# Patient Record
Sex: Female | Born: 1960
Health system: Southern US, Community
[De-identification: ages and names within clinical notes are randomized; demographics above are authoritative.]

## PROBLEM LIST (undated history)

## (undated) DIAGNOSIS — E079 Disorder of thyroid, unspecified: Secondary | ICD-10-CM

## (undated) DIAGNOSIS — K921 Melena: Secondary | ICD-10-CM

## (undated) DIAGNOSIS — G4730 Sleep apnea, unspecified: Secondary | ICD-10-CM

## (undated) DIAGNOSIS — I10 Essential (primary) hypertension: Secondary | ICD-10-CM

## (undated) DIAGNOSIS — E785 Hyperlipidemia, unspecified: Secondary | ICD-10-CM

## (undated) HISTORY — PX: UTERINE FIBROID EMBOLIZATION: SHX825

## (undated) HISTORY — DX: Melena: K92.1

## (undated) HISTORY — DX: Disorder of thyroid, unspecified: E07.9

## (undated) HISTORY — DX: Essential (primary) hypertension: I10

## (undated) HISTORY — DX: Hyperlipidemia, unspecified: E78.5

---

## 1998-07-20 ENCOUNTER — Other Ambulatory Visit: Admission: RE | Admit: 1998-07-20 | Discharge: 1998-07-20 | Payer: Self-pay | Admitting: Obstetrics and Gynecology

## 1998-12-14 ENCOUNTER — Ambulatory Visit (HOSPITAL_COMMUNITY): Admission: RE | Admit: 1998-12-14 | Discharge: 1998-12-14 | Payer: Self-pay | Admitting: Obstetrics and Gynecology

## 2001-06-10 ENCOUNTER — Other Ambulatory Visit: Admission: RE | Admit: 2001-06-10 | Discharge: 2001-06-10 | Payer: Self-pay | Admitting: Obstetrics and Gynecology

## 2002-01-01 ENCOUNTER — Inpatient Hospital Stay (HOSPITAL_COMMUNITY): Admission: RE | Admit: 2002-01-01 | Discharge: 2002-01-02 | Payer: Self-pay | Admitting: Gynecology

## 2002-01-01 ENCOUNTER — Encounter (INDEPENDENT_AMBULATORY_CARE_PROVIDER_SITE_OTHER): Payer: Self-pay | Admitting: Specialist

## 2003-01-22 ENCOUNTER — Other Ambulatory Visit: Admission: RE | Admit: 2003-01-22 | Discharge: 2003-01-22 | Payer: Self-pay | Admitting: Obstetrics and Gynecology

## 2004-01-26 ENCOUNTER — Other Ambulatory Visit: Admission: RE | Admit: 2004-01-26 | Discharge: 2004-01-26 | Payer: Self-pay | Admitting: Obstetrics and Gynecology

## 2004-02-29 ENCOUNTER — Encounter (INDEPENDENT_AMBULATORY_CARE_PROVIDER_SITE_OTHER): Payer: Self-pay | Admitting: Specialist

## 2004-02-29 ENCOUNTER — Encounter: Admission: RE | Admit: 2004-02-29 | Discharge: 2004-02-29 | Payer: Self-pay | Admitting: Surgery

## 2005-02-20 ENCOUNTER — Encounter: Admission: RE | Admit: 2005-02-20 | Discharge: 2005-02-20 | Payer: Self-pay | Admitting: Internal Medicine

## 2005-03-07 ENCOUNTER — Encounter: Admission: RE | Admit: 2005-03-07 | Discharge: 2005-03-07 | Payer: Self-pay | Admitting: Internal Medicine

## 2005-03-14 ENCOUNTER — Other Ambulatory Visit: Admission: RE | Admit: 2005-03-14 | Discharge: 2005-03-14 | Payer: Self-pay | Admitting: Obstetrics and Gynecology

## 2006-02-26 ENCOUNTER — Encounter: Admission: RE | Admit: 2006-02-26 | Discharge: 2006-02-26 | Payer: Self-pay | Admitting: Internal Medicine

## 2006-05-21 ENCOUNTER — Encounter: Admission: RE | Admit: 2006-05-21 | Discharge: 2006-05-21 | Payer: Self-pay | Admitting: Obstetrics and Gynecology

## 2007-03-01 ENCOUNTER — Encounter: Admission: RE | Admit: 2007-03-01 | Discharge: 2007-03-01 | Payer: Self-pay | Admitting: Obstetrics and Gynecology

## 2008-03-02 ENCOUNTER — Encounter: Admission: RE | Admit: 2008-03-02 | Discharge: 2008-03-02 | Payer: Self-pay | Admitting: Obstetrics and Gynecology

## 2008-09-23 ENCOUNTER — Ambulatory Visit: Payer: Self-pay | Admitting: Family Medicine

## 2009-03-29 ENCOUNTER — Encounter: Admission: RE | Admit: 2009-03-29 | Discharge: 2009-03-29 | Payer: Self-pay | Admitting: Obstetrics and Gynecology

## 2009-08-11 ENCOUNTER — Ambulatory Visit: Payer: Self-pay | Admitting: Gastroenterology

## 2010-04-08 ENCOUNTER — Encounter: Admission: RE | Admit: 2010-04-08 | Discharge: 2010-04-08 | Payer: Self-pay | Admitting: Internal Medicine

## 2010-12-11 ENCOUNTER — Encounter: Payer: Self-pay | Admitting: Internal Medicine

## 2011-03-31 ENCOUNTER — Other Ambulatory Visit: Payer: Self-pay | Admitting: Obstetrics and Gynecology

## 2011-03-31 DIAGNOSIS — Z1231 Encounter for screening mammogram for malignant neoplasm of breast: Secondary | ICD-10-CM

## 2011-04-07 NOTE — H&P (Signed)
[facility]  Patient:    Alison Rodriguez, Alison Rodriguez Visit Number: [id number] MRN: [id number]          Service Type: GYN Location: 1S X003 01 Attending Physician:  Rolinda Roan Dictated by:   Leatha Gilding. Mezer, M.D. Admit Date:  01/01/2002   CC:         Loraine Leriche E. Dareen Piano, M.D.   History and Physical  ADMITTING DIAGNOSIS:  Fibroid uterus.  HISTORY OF PRESENT ILLNESS:  The patient is a 50 year old gravida 1, AB1 female admitted with a fibroid uterus, menorrhagia, and dysmenorrhea admitted for abdominal myomectomy.  The patient has been without contraception for greater than seven years and on hysterosalpingogram had a significant distortion of the uterine cavity.  Ultrasound examination has confirmed multiple leiomyomata.  The patient also has severe dysmenorrhea and menorrhagia and has been using super tampons and overnight pads concurrently and changing about every hour.  She is frequently confined to bed.  Myomectomy has been discussed in detail with the patient and potential complications including but not limited to anesthesia; injury to the bowel, bladder, ureter; possible blood loss with transfusion and its sequelae; and possible postoperative adhesions have been discussed with the patient.  Because of the location of the fibroids, possible injury to or removal of the right fallopian tube had been discussed.  The possible need for hysterectomy at the time of surgery has also been discussed.  The patient understands that there is no guarantee that she will become pregnant or that she will not miscarry after this procedure.  There is also no guarantee that her menstrual cycles will decrease in flow, intensity, or discomfort.  Postoperative restrictions have been reviewed with the patient in detail.  PAST MEDICAL HISTORY:  Surgical:  Diagnostic laparoscopy.  Medical:  None.  MEDICATIONS:  Iron.  ALLERGIES:  None known.  SOCIAL HISTORY:  Smokes:   None.  FAMILY HISTORY:  Positive for diabetes and cardiovascular disease.  The patient does not smoke or drink alcohol.  She has a very supportive husband and is employed.  PHYSICAL EXAMINATION:  HEENT:  Negative.  HEART:  Without murmurs.  LUNGS:  Clear.  BREASTS:  Without masses or discharge.  ABDOMEN:  Obese, soft, and firm in the lower abdomen.  PELVIS:  The pelvic exam reveals ______, vagina, and cervix to be normal. The uterus is enlarged, possibly to approximately 16 weeks in size and irregular and it is quite firm and the adnexa are without masses.  EXTREMITIES:  Negative.  IMPRESSION:  Fibroid uterus, infertility, dysmenorrhea/menorrhagia, obesity.  PLAN:  Exploratory laparotomy with myomectomy. Dictated by:   Leatha Gilding. Mezer, M.D. Attending Physician:  Rolinda Roan DD:  01/01/02 TD:  01/01/02 Job: [id number]

## 2011-04-07 NOTE — H&P (Signed)
[facility]  Patient:    Alison Rodriguez, Alison Rodriguez Visit Number: [id number] MRN: [id number]          Service Type: GYN Location: 1S X003 01 Attending Physician:  Teodora Medici Cabitt Dictated by:   Leatha Gilding. Mezer, M.D. Admit Date:  01/01/2002                           History and Physical  NO DICTATION Dictated by:   Leatha Gilding. Mezer, M.D. Attending Physician:  Rolinda Roan DD:  01/01/02 TD:  01/01/02 Job: [id number]

## 2011-04-07 NOTE — Op Note (Signed)
[facility]  Patient:    Alison Rodriguez, Alison Rodriguez Visit Number: [id number] MRN: [id number]          Service Type: GYN Location: 4W 0455 01 Attending Physician:  Teodora Medici Cabitt Dictated by:   Leatha Gilding. Mezer, M.D. Proc. Date: 01/01/01 Admit Date:  01/01/2002   CC:         Loraine Leriche E. Dareen Piano, M.D.  Harl Bowie, M.D.   Operative Report  PREOPERATIVE DIAGNOSES:  1. Fibroid uterus.  2. Menorrhagia.  3. Dysmenorrhea.  POSTOPERATIVE DIAGNOSES:  1. Fibroid uterus.  2. Menorrhagia.  3. Dysmenorrhea.  OPERATION PERFORMED:  Myomectomy.  SURGEON:  Leatha Gilding. Mezer, M.D.  ASSISTANT:  Harl Bowie, M.D.  ANESTHESIA:  General endotracheal.  PREPARATION:  Betadine.  DESCRIPTION OF PROCEDURE:  With the patient in the supine position, he was prepped and draped in a routine fashion. The patients panniculus was particularly thick and an incision was made through the skin and subcutaneous tissue approximately two finger breadths above the pubic symphysis. There was significant bleeding from the initial incision down through opening the peritoneum. This all appeared to come from vessels and there was no significant oozing. Meticulous attention was paid to hemostasis. Upon entering the peritoneum, brief exploration of her upper abdomen was benign. Exploration of the pelvis revealed the uterus to be approximately 14 weeks in size due mainly to a left fundal subserosal myoma. The panniculus location of the sacrum and generous size large colon that was adherent to the left pelvic side wall at the level of the sacrum probably made the uterus feel larger on preoperative examination. The remainder of the uterus contained several small deep intramural fibroids which appeared not to have impact on the cavity. They were very mobile and although the incision was generous in size, the sacral promontory was in such a position as to make exposure of the posterior  aspect of the uterus quite challenging. Unfortunately the largest fibroid was intramural and probably submucosal and extended from just below the round ligament on the left side to below the uterine artery and vein. This did not deviate significantly anteriorly or posteriorly and it appeared to be impossible to remove this myoma without having an extremely high chance of damaging the uterine artery and vein and probably requiring removal of the left fallopian tube. As per the discussion with the patient preoperatively, she wished to take no excessive risk that would lead to hysterectomy but in the surgeons opinion, attempting to remove this fibroid would make the risk for hysterectomy unacceptably high to the patient. The left fundal subserosal fibroid was injected with dilute Pitressin, the serosa incised, the fibroid removed and hemostasis achieved in the base of the fibroid. The deep layers were closed with #0 chromic suture and the serosa was approximated with a running baseball stitch of 3-0 Vicryl. This area was also quite oozy but hemostasis appeared to be completely intact. The tubes and ovaries appeared to be normal. There was no additional pathology noted and at this point the abdomen was closed in layers using a running 2-0 Vicryl on the peritoneum, running #0 Vicryl at the midline bilaterally on the fascia, hemostasis was assured in the subcutaneous tissue and the skin was closed with staples. The estimated blood loss was approximately 125 cc, the sponge, needle and instrument counts were correct x2. The patient tolerated the procedure well and was taken to the recovery room in satisfactory condition. Dictated by: Leatha Gilding. Mezer, M.D. Attending Physician:  Teodora Medici  Cabitt DD:  01/01/02 TD:  01/01/02 Job: [id number]

## 2011-04-13 ENCOUNTER — Ambulatory Visit
Admission: RE | Admit: 2011-04-13 | Discharge: 2011-04-13 | Disposition: A | Discharge status: Home / Self Care | Payer: BC Managed Care – PPO | Source: Ambulatory Visit | Attending: Obstetrics and Gynecology | Admitting: Obstetrics and Gynecology

## 2011-04-13 DIAGNOSIS — Z1231 Encounter for screening mammogram for malignant neoplasm of breast: Secondary | ICD-10-CM

## 2011-06-28 ENCOUNTER — Other Ambulatory Visit: Payer: Self-pay | Admitting: Obstetrics and Gynecology

## 2012-04-05 ENCOUNTER — Other Ambulatory Visit: Payer: Self-pay | Admitting: Obstetrics and Gynecology

## 2012-04-05 DIAGNOSIS — Z1231 Encounter for screening mammogram for malignant neoplasm of breast: Secondary | ICD-10-CM

## 2012-04-08 ENCOUNTER — Ambulatory Visit: Payer: BC Managed Care – PPO

## 2012-04-26 ENCOUNTER — Ambulatory Visit
Admission: RE | Admit: 2012-04-26 | Discharge: 2012-04-26 | Disposition: A | Discharge status: Home / Self Care | Payer: BC Managed Care – PPO | Source: Ambulatory Visit | Attending: Obstetrics and Gynecology | Admitting: Obstetrics and Gynecology

## 2012-04-26 DIAGNOSIS — Z1231 Encounter for screening mammogram for malignant neoplasm of breast: Secondary | ICD-10-CM

## 2012-05-02 ENCOUNTER — Other Ambulatory Visit: Payer: Self-pay | Admitting: Obstetrics and Gynecology

## 2012-05-02 DIAGNOSIS — R928 Other abnormal and inconclusive findings on diagnostic imaging of breast: Secondary | ICD-10-CM

## 2012-05-07 ENCOUNTER — Ambulatory Visit
Admission: RE | Admit: 2012-05-07 | Discharge: 2012-05-07 | Disposition: A | Discharge status: Home / Self Care | Payer: BC Managed Care – PPO | Source: Ambulatory Visit | Attending: Obstetrics and Gynecology | Admitting: Obstetrics and Gynecology

## 2012-05-07 DIAGNOSIS — R928 Other abnormal and inconclusive findings on diagnostic imaging of breast: Secondary | ICD-10-CM

## 2013-05-07 ENCOUNTER — Other Ambulatory Visit: Payer: Self-pay

## 2013-05-07 DIAGNOSIS — Z1231 Encounter for screening mammogram for malignant neoplasm of breast: Secondary | ICD-10-CM

## 2013-05-09 ENCOUNTER — Ambulatory Visit
Admission: RE | Admit: 2013-05-09 | Discharge: 2013-05-09 | Disposition: A | Discharge status: Home / Self Care | Payer: BC Managed Care – PPO | Source: Ambulatory Visit

## 2013-05-09 DIAGNOSIS — Z1231 Encounter for screening mammogram for malignant neoplasm of breast: Secondary | ICD-10-CM

## 2013-07-07 ENCOUNTER — Other Ambulatory Visit: Payer: Self-pay | Admitting: Obstetrics and Gynecology

## 2013-08-26 ENCOUNTER — Ambulatory Visit: Payer: BC Managed Care – PPO | Admitting: Internal Medicine

## 2013-11-26 ENCOUNTER — Ambulatory Visit (INDEPENDENT_AMBULATORY_CARE_PROVIDER_SITE_OTHER): Payer: Managed Care, Other (non HMO) | Admitting: Internal Medicine

## 2013-11-26 ENCOUNTER — Telehealth: Payer: Self-pay | Admitting: Internal Medicine

## 2013-11-26 ENCOUNTER — Encounter: Payer: Self-pay | Admitting: *Deleted

## 2013-11-26 ENCOUNTER — Encounter: Payer: Self-pay | Admitting: Internal Medicine

## 2013-11-26 VITALS — BP 120/82 | HR 69 | Temp 98.6°F | Ht 59.0 in | Wt 156.0 lb

## 2013-11-26 DIAGNOSIS — R519 Headache, unspecified: Secondary | ICD-10-CM | POA: Insufficient documentation

## 2013-11-26 DIAGNOSIS — K649 Unspecified hemorrhoids: Secondary | ICD-10-CM

## 2013-11-26 DIAGNOSIS — Z87898 Personal history of other specified conditions: Secondary | ICD-10-CM

## 2013-11-26 DIAGNOSIS — E7800 Pure hypercholesterolemia, unspecified: Secondary | ICD-10-CM | POA: Insufficient documentation

## 2013-11-26 DIAGNOSIS — Z8742 Personal history of other diseases of the female genital tract: Secondary | ICD-10-CM

## 2013-11-26 DIAGNOSIS — I10 Essential (primary) hypertension: Secondary | ICD-10-CM

## 2013-11-26 DIAGNOSIS — R030 Elevated blood-pressure reading, without diagnosis of hypertension: Secondary | ICD-10-CM | POA: Insufficient documentation

## 2013-11-26 DIAGNOSIS — R0602 Shortness of breath: Secondary | ICD-10-CM

## 2013-11-26 DIAGNOSIS — IMO0001 Reserved for inherently not codable concepts without codable children: Secondary | ICD-10-CM

## 2013-11-26 DIAGNOSIS — R0789 Other chest pain: Secondary | ICD-10-CM

## 2013-11-26 DIAGNOSIS — R51 Headache: Secondary | ICD-10-CM

## 2013-11-26 LAB — BASIC METABOLIC PANEL
BUN: 12 mg/dL (ref 6–23)
CALCIUM: 9.2 mg/dL (ref 8.4–10.5)
CHLORIDE: 106 meq/L (ref 96–112)
CO2: 27 meq/L (ref 19–32)
CREATININE: 0.6 mg/dL (ref 0.4–1.2)
GFR: 133.48 mL/min (ref >60.00)
GLUCOSE: 77 mg/dL (ref 70–99)
POTASSIUM: 4.3 meq/L (ref 3.5–5.1)
SODIUM: 141 meq/L (ref 135–145)

## 2013-11-26 LAB — LIPID PANEL
Cholesterol: 151 mg/dL (ref 0–200)
HDL: 51.9 mg/dL (ref >39.00)
LDL CALC: 91 mg/dL (ref 0–99)
Total CHOL/HDL Ratio: 3
Triglycerides: 41 mg/dL (ref 0.0–149.0)
VLDL: 8.2 mg/dL (ref 0.0–40.0)

## 2013-11-26 LAB — CBC WITH DIFFERENTIAL/PLATELET
BASOS ABS: 0 10*3/uL (ref 0.0–0.1)
Basophils Relative: 0.4 % (ref 0.0–3.0)
EOS ABS: 0.3 10*3/uL (ref 0.0–0.7)
Eosinophils Relative: 3.9 % (ref 0.0–5.0)
HCT: 43 % (ref 36.0–46.0)
HEMOGLOBIN: 14.6 g/dL (ref 12.0–15.0)
Lymphocytes Relative: 29.7 % (ref 12.0–46.0)
Lymphs Abs: 2 10*3/uL (ref 0.7–4.0)
MCHC: 33.8 g/dL (ref 30.0–36.0)
MCV: 87.4 fl (ref 78.0–100.0)
Monocytes Absolute: 0.4 10*3/uL (ref 0.1–1.0)
Monocytes Relative: 5.7 % (ref 3.0–12.0)
NEUTROS PCT: 60.3 % (ref 43.0–77.0)
Neutro Abs: 4.1 10*3/uL (ref 1.4–7.7)
PLATELETS: 308 10*3/uL (ref 150.0–400.0)
RBC: 4.92 Mil/uL (ref 3.87–5.11)
RDW: 13.8 % (ref 11.5–14.6)
WBC: 6.7 10*3/uL (ref 4.5–10.5)

## 2013-11-26 LAB — TSH: TSH: 0.74 u[IU]/mL (ref 0.35–5.50)

## 2013-11-26 LAB — HEPATIC FUNCTION PANEL
ALT: 22 U/L (ref 0–35)
AST: 19 U/L (ref 0–37)
Albumin: 4 g/dL (ref 3.5–5.2)
Alkaline Phosphatase: 60 U/L (ref 39–117)
BILIRUBIN TOTAL: 0.4 mg/dL (ref 0.3–1.2)
Bilirubin, Direct: 0.1 mg/dL (ref 0.0–0.3)
Total Protein: 7.6 g/dL (ref 6.0–8.3)

## 2013-11-26 NOTE — Progress Notes (Signed)
   Subjective:    Patient ID: Alison Rodriguez, female    DOB: [birth year], 53 y.o.   MRN: [id number]  HPI 53 year old female with past history of documented hypertension and hypercholesterolemia who comes in today to follow up on these issues as well as to establish care.  Has noticed some intermittent headache over the last few months.  Described as frontal headache and some tightness.  Has taken some advil and otc sinus medication.  This helps the headache.  Has not noticed over the last several weeks.  Was questioning if the headache was coming from her  Blood pressure being elevated.  Has noticed some "cold symptoms".  Some nasal congestion and productive cough - previously.  States this occurred after she received her flu shot.  She is sleeping ok.  Is due eye check.  Does report noticing some intermittent chest tightness and notices some sob with exertion.  No wheezing.  Has noticed some increased urinary frequency.  Has a history of reoccurring yeast infections.  Had colonoscopy two years ago.  Had hemorrhoids.  Is followed by Dr Ouida Sills at [facility].  Up to date with pelvic and pap smears.  Has a history of abnormal pap.  Most recent paps have been ok.  Had mammogram 05/2013 - ok.     Past Medical History  Diagnosis Date  . Hypertension   . Hyperlipidemia   . Thyroid disease   . Blood in stool     H/O    Outpatient Encounter Prescriptions as of 11/26/2013  Medication Sig  . rosuvastatin (CRESTOR) 10 MG tablet Take 10 mg by mouth daily.    Review of Systems Patient denies any headache currently.  Has had an issue with headaches recently.  No lightheadedness or dizziness.  No palpitations. Does report some chest tightness.  Sob with increased exertion.   No increased cough or congestion now.  Previous cough.   No nausea or vomiting.  No acid reflux.  No abdominal pain or cramping.  No bowel change, such as diarrhea, constipation, BRBPR or melana.  Some increased urinary frequency.   Questioning sinus issues.        Objective:   Physical Exam Filed Vitals:   11/26/13 0838  BP: 120/82  Pulse: 69  Temp: 98.6 F (28 C)   53 year old female in no acute distress.   HEENT:  Nares- clear.  Oropharynx - without lesions. NECK:  Supple.  Nontender.  No audible bruit.  HEART:  Appears to be regular. LUNGS:  No crackles or wheezing audible.  Respirations even and unlabored.  RADIAL PULSE:  Equal bilaterally.   ABDOMEN:  Soft, nontender.  Bowel sounds present and normal.  No audible abdominal bruit.  EXTREMITIES:  No increased edema present.  DP pulses palpable and equal bilaterally.          Assessment & Plan:  HEALTH MAINTENANCE.  Breast, pelvic and pap smears through Dr Tonette Bihari office.  Obtain records.  Last mammogram 05/2013.  Colonoscopy two years ago - hemorrhoids.    I spent 45 minutes with the patient and more than 50% of the time was spent in consultation regarding the above.

## 2013-11-26 NOTE — Progress Notes (Signed)
Pre-visit discussion using our clinic review tool. No additional management support is needed unless otherwise documented below in the visit note.  

## 2013-11-26 NOTE — Telephone Encounter (Signed)
Pt will not need referrals to come back to PCP for f/u

## 2013-11-26 NOTE — Telephone Encounter (Signed)
Pt needs referral appt 01/05/2014 in the a.m.  Has been scheduled to see Dr. Nicki Reaper 01/05/2014 @ 3:15 p.m.

## 2013-11-30 ENCOUNTER — Encounter: Payer: Self-pay | Admitting: Internal Medicine

## 2013-11-30 DIAGNOSIS — K649 Unspecified hemorrhoids: Secondary | ICD-10-CM | POA: Insufficient documentation

## 2013-11-30 DIAGNOSIS — R0789 Other chest pain: Secondary | ICD-10-CM | POA: Insufficient documentation

## 2013-11-30 DIAGNOSIS — R87619 Unspecified abnormal cytological findings in specimens from cervix uteri: Secondary | ICD-10-CM | POA: Insufficient documentation

## 2013-11-30 DIAGNOSIS — R87610 Atypical squamous cells of undetermined significance on cytologic smear of cervix (ASC-US): Secondary | ICD-10-CM | POA: Insufficient documentation

## 2013-11-30 NOTE — Assessment & Plan Note (Signed)
Has noticed some intermittent headaches over the last few months.  Currently not have a headache.  She was questioning blood pressure elevation.  Blood pressure today ok.  No significant sinus symptoms today.  Instructed on saline nasal spray.  Avoid otc decongestants.  Discussed further w/up for the headache.  Wants to hold on scanning.  Will follow.  If persistent, will require further evaluation and testing.

## 2013-11-30 NOTE — Assessment & Plan Note (Signed)
Persistent chest tightness and sob with exertion.  EKG obtained and revealed what appeared to be SR.  (poor quality).  Multiple attempts made to get EKG.  TWI aVL.  Will obtain stress echo to confirm no active ischemia.  She knows if symptoms change or worsen - to be reevaluated.

## 2013-11-30 NOTE — Assessment & Plan Note (Signed)
On crestor.  Low cholesterol diet.  Check lipid panel and liver function.

## 2013-11-30 NOTE — Assessment & Plan Note (Signed)
Sees Dr Ouida Sills (her gyn).  Most recent paps have been normal.  Up to date.  Obtain records.

## 2013-11-30 NOTE — Assessment & Plan Note (Signed)
Blood pressure recheck:  118/80.  Blood pressure looks good on today's check.  Have her spot check her pressure.  Follow.  Check metabolic panel.

## 2013-11-30 NOTE — Assessment & Plan Note (Signed)
Found on colonoscopy.  Not an issue now.  Follow.

## 2013-12-01 ENCOUNTER — Telehealth: Payer: Self-pay | Admitting: Emergency Medicine

## 2013-12-01 NOTE — Telephone Encounter (Signed)
LVM for patient to call. [facility] cannot perform test on Mondays, no echo tech. Presidents Day falls on a Monday.

## 2013-12-15 NOTE — Telephone Encounter (Signed)
Left message for patient with family member for her to return our call

## 2013-12-18 ENCOUNTER — Telehealth: Payer: Self-pay | Admitting: Emergency Medicine

## 2013-12-18 ENCOUNTER — Other Ambulatory Visit: Payer: Self-pay | Admitting: *Deleted

## 2013-12-18 MED ORDER — ROSUVASTATIN CALCIUM 10 MG PO TABS: 10.0000 mg | ORAL_TABLET | Freq: Every day | ORAL | Status: DC

## 2013-12-18 NOTE — Telephone Encounter (Signed)
Pt requesting refill on rosuvastatin (CRESTOR) 10 MG tablet. She has one pill left.

## 2013-12-18 NOTE — Telephone Encounter (Signed)
Patient aware that [facility] does not have anything on presidents day. I have given her the number to schedule since she has a "different" schedule.

## 2013-12-18 NOTE — Telephone Encounter (Signed)
Sent via eRx.

## 2014-01-05 ENCOUNTER — Ambulatory Visit (INDEPENDENT_AMBULATORY_CARE_PROVIDER_SITE_OTHER): Payer: Managed Care, Other (non HMO) | Admitting: Internal Medicine

## 2014-01-05 ENCOUNTER — Encounter: Payer: Self-pay | Admitting: Internal Medicine

## 2014-01-05 VITALS — BP 118/80 | HR 89 | Temp 97.9°F | Ht 59.0 in | Wt 153.8 lb

## 2014-01-05 DIAGNOSIS — E7800 Pure hypercholesterolemia, unspecified: Secondary | ICD-10-CM

## 2014-01-05 DIAGNOSIS — K649 Unspecified hemorrhoids: Secondary | ICD-10-CM

## 2014-01-05 DIAGNOSIS — R51 Headache: Secondary | ICD-10-CM

## 2014-01-05 DIAGNOSIS — R030 Elevated blood-pressure reading, without diagnosis of hypertension: Secondary | ICD-10-CM

## 2014-01-05 DIAGNOSIS — R0789 Other chest pain: Secondary | ICD-10-CM

## 2014-01-05 DIAGNOSIS — IMO0001 Reserved for inherently not codable concepts without codable children: Secondary | ICD-10-CM

## 2014-01-05 MED ORDER — ROSUVASTATIN CALCIUM 10 MG PO TABS: 10.0000 mg | ORAL_TABLET | Freq: Every day | ORAL | Status: DC

## 2014-01-05 NOTE — Progress Notes (Signed)
Pre-visit discussion using our clinic review tool. No additional management support is needed unless otherwise documented below in the visit note.  

## 2014-01-06 ENCOUNTER — Encounter: Payer: Self-pay | Admitting: Internal Medicine

## 2014-01-06 NOTE — Assessment & Plan Note (Signed)
Not an issue now.  Blood pressure doing well.    

## 2014-01-06 NOTE — Assessment & Plan Note (Signed)
Resolved

## 2014-01-06 NOTE — Progress Notes (Signed)
   Subjective:    Patient ID: Alison Rodriguez, female    DOB: 10/02/1961, 53 y.o.   MRN: [id number]  HPI 53 year old female with past history of documented hypertension and hypercholesterolemia who comes in today for a scheduled follow up.   She states she is doing well and feels good.  No chest pain or tightness.  No sob.  States the previous symptoms she was experiencing have resolved.  She is scheduled for a stress test and wants to cancel the appt - since feeling better.  Eating and drinking well.  Bowels stable.   Had colonoscopy two years ago.  Had hemorrhoids.  Is followed by Dr Ouida Sills at [facility].  Up to date with pelvic and pap smears.  Has a history of abnormal pap.  Most recent paps have been ok.  Had mammogram 05/2013 - ok.     Past Medical History  Diagnosis Date  . Hypertension   . Hyperlipidemia   . Thyroid disease   . Blood in stool     H/O    Outpatient Encounter Prescriptions as of 01/05/2014  Medication Sig  . rosuvastatin (CRESTOR) 10 MG tablet Take 1 tablet (10 mg total) by mouth daily.  . [DISCONTINUED] rosuvastatin (CRESTOR) 10 MG tablet Take 1 tablet (10 mg total) by mouth daily.    Review of Systems Patient denies any headache.  Resolved.  No lightheadedness or dizziness.  No palpitations.  No chest pain or tightness.  No sob or doe.  No increased cough or congestion.   No nausea or vomiting.  No acid reflux.  No abdominal pain or cramping.  No bowel change, such as diarrhea, constipation, BRBPR or melana.  No urinary symptoms or vaginal problems.  Blood pressure on outside checks - 122-135/60-70s.        Objective:   Physical Exam  Filed Vitals:   01/05/14 1209  BP: 118/80  Pulse: 89  Temp: 97.9 F (36.6 C)   Blood pressure recheck:  57/29  53 year old female in no acute distress.   HEENT:  Nares- clear.  Oropharynx - without lesions. NECK:  Supple.  Nontender.  No audible bruit.  HEART:  Appears to be regular. LUNGS:  No crackles or  wheezing audible.  Respirations even and unlabored.  RADIAL PULSE:  Equal bilaterally.   ABDOMEN:  Soft, nontender.  Bowel sounds present and normal.  No audible abdominal bruit.  EXTREMITIES:  No increased edema present.  DP pulses palpable and equal bilaterally.          Assessment & Plan:  HEALTH MAINTENANCE.  Breast, pelvic and pap smears through Dr Tonette Bihari office.  Obtain records.  Last mammogram 05/2013.  Colonoscopy two years ago - hemorrhoids.

## 2014-01-06 NOTE — Assessment & Plan Note (Signed)
On crestor.  Low cholesterol diet.  Check lipid panel and liver function.  Most recent check wnl.    

## 2014-01-06 NOTE — Assessment & Plan Note (Signed)
Found on colonoscopy.  Not an issue now.  Follow.   

## 2014-01-06 NOTE — Assessment & Plan Note (Signed)
Sees Dr Ouida Sills (her gyn).  Most recent paps have been normal.  Up to date.

## 2014-01-06 NOTE — Assessment & Plan Note (Signed)
Resolved.  Asymptomatic now.  Desires not to pursue stress testing now.  Follow.  Will notify me if symptoms change or reoccur.

## 2014-01-26 ENCOUNTER — Telehealth: Payer: Self-pay | Admitting: Internal Medicine

## 2014-01-26 NOTE — Telephone Encounter (Signed)
Please advise 

## 2014-01-26 NOTE — Telephone Encounter (Signed)
Pt notified to go to the ER. Pt verbalized understanding & states that she will go today

## 2014-01-26 NOTE — Telephone Encounter (Signed)
If 8/10 abdominal pain and vomiting - needs evaluation in ER (may need scanning ,etc).

## 2014-01-26 NOTE — Telephone Encounter (Signed)
Patient Information:  Caller Name: Antia  Phone: [phone]  Patient: Alison Rodriguez  Gender: Female  DOB: Apr 11, 1961  Age: 53 Years  PCP: Einar Pheasant  Pregnant: No  Office Follow Up:  Does the office need to follow up with this patient?: Yes  Instructions For The Office: OV or ER for Left constant abd. pain > 2 hrs and vomiting.  RN Note:  Message left for office for work in appt vs ED and epic message will be sent.  Patient will be notified.  Will f/u in 30 minutes to verify if appt. worked in.  Symptoms  Reason For Call & Symptoms: Abdominal pain and vomiting;  Onset around midnight;  Left lower abdominal pain;  Constant, 8/10;  Has vomited 8 x's but mostly dry heaving, not much comes up;  Walking makes pain worse.  Reviewed Health History In EMR: Yes  Reviewed Medications In EMR: Yes  Reviewed Allergies In EMR: Yes  Reviewed Surgeries / Procedures: Yes  Date of Onset of Symptoms: 01/26/2014 OB / GYN:  LMP: Unknown  Guideline(s) Used:  Abdominal Pain - Female  Disposition Per Guideline:   Go to ED Now (or to Office with PCP Approval)  Reason For Disposition Reached:   Constant abdominal pain lasting > 2 hours  Advice Given:  N/A  Patient Will Follow Care Advice:  YES

## 2014-01-27 NOTE — Telephone Encounter (Signed)
I faxed a request today for ER records & received a fax back informing me that pt has not seen since 2010.

## 2014-06-09 ENCOUNTER — Other Ambulatory Visit: Payer: Self-pay

## 2014-06-09 DIAGNOSIS — Z1231 Encounter for screening mammogram for malignant neoplasm of breast: Secondary | ICD-10-CM

## 2014-06-17 ENCOUNTER — Ambulatory Visit
Admission: RE | Admit: 2014-06-17 | Discharge: 2014-06-17 | Disposition: A | Discharge status: Home / Self Care | Payer: Managed Care, Other (non HMO) | Source: Ambulatory Visit

## 2014-06-17 DIAGNOSIS — Z1231 Encounter for screening mammogram for malignant neoplasm of breast: Secondary | ICD-10-CM

## 2014-07-07 ENCOUNTER — Ambulatory Visit: Payer: Managed Care, Other (non HMO) | Admitting: Internal Medicine

## 2014-07-07 DIAGNOSIS — Z0289 Encounter for other administrative examinations: Secondary | ICD-10-CM

## 2014-07-08 ENCOUNTER — Other Ambulatory Visit: Payer: Self-pay | Admitting: Obstetrics and Gynecology

## 2014-07-10 LAB — CYTOLOGY - PAP

## 2014-07-16 ENCOUNTER — Ambulatory Visit (INDEPENDENT_AMBULATORY_CARE_PROVIDER_SITE_OTHER): Payer: Managed Care, Other (non HMO) | Admitting: Internal Medicine

## 2014-07-16 ENCOUNTER — Encounter: Payer: Self-pay | Admitting: Internal Medicine

## 2014-07-16 VITALS — BP 110/80 | HR 79 | Temp 98.6°F | Ht 59.0 in | Wt 158.0 lb

## 2014-07-16 DIAGNOSIS — E669 Obesity, unspecified: Secondary | ICD-10-CM

## 2014-07-16 DIAGNOSIS — R300 Dysuria: Secondary | ICD-10-CM

## 2014-07-16 DIAGNOSIS — R030 Elevated blood-pressure reading, without diagnosis of hypertension: Secondary | ICD-10-CM

## 2014-07-16 DIAGNOSIS — E7800 Pure hypercholesterolemia, unspecified: Secondary | ICD-10-CM

## 2014-07-16 DIAGNOSIS — IMO0001 Reserved for inherently not codable concepts without codable children: Secondary | ICD-10-CM

## 2014-07-16 DIAGNOSIS — R51 Headache: Secondary | ICD-10-CM

## 2014-07-16 LAB — LIPID PANEL
CHOLESTEROL: 135 mg/dL (ref 0–200)
HDL: 54.3 mg/dL (ref >39.00)
LDL CALC: 71 mg/dL (ref 0–99)
NonHDL: 80.7
Total CHOL/HDL Ratio: 2
Triglycerides: 47 mg/dL (ref 0.0–149.0)
VLDL: 9.4 mg/dL (ref 0.0–40.0)

## 2014-07-16 LAB — COMPREHENSIVE METABOLIC PANEL
ALBUMIN: 3.7 g/dL (ref 3.5–5.2)
ALT: 18 U/L (ref 0–35)
AST: 17 U/L (ref 0–37)
Alkaline Phosphatase: 52 U/L (ref 39–117)
BUN: 10 mg/dL (ref 6–23)
CO2: 27 mEq/L (ref 19–32)
Calcium: 9.2 mg/dL (ref 8.4–10.5)
Chloride: 108 mEq/L (ref 96–112)
Creatinine, Ser: 0.6 mg/dL (ref 0.4–1.2)
GFR: 143.98 mL/min (ref >60.00)
GLUCOSE: 86 mg/dL (ref 70–99)
POTASSIUM: 4.3 meq/L (ref 3.5–5.1)
SODIUM: 141 meq/L (ref 135–145)
Total Bilirubin: 0.5 mg/dL (ref 0.2–1.2)
Total Protein: 7.5 g/dL (ref 6.0–8.3)

## 2014-07-16 LAB — POCT URINALYSIS DIPSTICK
BILIRUBIN UA: NEGATIVE
Glucose, UA: NEGATIVE
KETONES UA: NEGATIVE
Nitrite, UA: NEGATIVE
PH UA: 5.5
PROTEIN UA: NEGATIVE
Spec Grav, UA: 1.025
Urobilinogen, UA: 0.2

## 2014-07-16 MED ORDER — ROSUVASTATIN CALCIUM 10 MG PO TABS: 10.0000 mg | ORAL_TABLET | Freq: Every day | ORAL | Status: DC

## 2014-07-16 MED ORDER — MUPIROCIN CALCIUM 2 % EX CREA: 1.0000 "application " | TOPICAL_CREAM | Freq: Two times a day (BID) | CUTANEOUS | Status: DC

## 2014-07-16 MED ORDER — CIPROFLOXACIN HCL 500 MG PO TABS: 500.0000 mg | ORAL_TABLET | Freq: Two times a day (BID) | ORAL | Status: DC

## 2014-07-16 NOTE — Progress Notes (Signed)
   Subjective:    Patient ID: Alison Rodriguez, female    DOB: [birth year], 53 y.o.   MRN: [id number]  HPI 53 year old female with past history of documented hypertension and hypercholesterolemia who comes in today for a scheduled follow up.   She states she has been doing well.  Had a headache yesterday am.  Resolved after taking something for her sinus.  Has not had problems with persistent headaches.  No headache today.   No chest pain or tightness.  No sob.  Eating and drinking well.   Trying to adjust her diet.  Feels she could do better.  Is exercising.  Bowels stable.   Had colonoscopy two years ago.  Had hemorrhoids.  Is followed by Dr Ouida Sills at [facility].  Up to date with pelvic and pap smears.  Just had her pap 07/08/14.  Has a history of abnormal pap.  Most recent paps have been ok.  Had mammogram 06/17/14 - Birads I.  She does report noticing increased urinary frequency and dysuria.  Started at the beginning of the week.  Better today.  No vaginal itching, burning or discharge.     Past Medical History  Diagnosis Date  . Hypertension   . Hyperlipidemia   . Thyroid disease   . Blood in stool     H/O    Outpatient Encounter Prescriptions as of 07/16/2014  Medication Sig  . rosuvastatin (CRESTOR) 10 MG tablet Take 1 tablet (10 mg total) by mouth daily.    Review of Systems Patient denies any headache now.  Resolved.  No lightheadedness or dizziness.  No palpitations.  No chest pain or tightness.  No sob or doe.  No increased cough or congestion.   No nausea or vomiting.  No acid reflux.  No abdominal pain or cramping.  No bowel change, such as diarrhea, constipation, BRBPR or melana.  No vaginal problems.  LMP in 6/15.  Skips months occasionally.  Urinary symptoms as outlined.  Not checking her blood pressure.          Objective:   Physical Exam  Filed Vitals:   07/16/14 0814  BP: 110/80  Pulse: 79  Temp: 98.6 F (37 C)   Blood pressure recheck:  72/68-43  53  year old female in no acute distress.   HEENT:  Nares- clear.  Oropharynx - without lesions. NECK:  Supple.  Nontender.  No audible bruit.  HEART:  Appears to be regular. LUNGS:  No crackles or wheezing audible.  Respirations even and unlabored.  RADIAL PULSE:  Equal bilaterally.   ABDOMEN:  Soft, nontender.  Bowel sounds present and normal.  No audible abdominal bruit. BACK:  Non tender.  No CVA tenderness.    EXTREMITIES:  No increased edema present.  DP pulses palpable and equal bilaterally.          Assessment & Plan:  HEALTH MAINTENANCE.  Breast, pelvic and pap smears through Dr Tonette Bihari office.  PAP 07/08/14 - ok.  No HPV performed.   Mammogram 06/17/14 - Birads I.  Colonoscopy two years ago - hemorrhoids.    I spent 25 minutes with the patient and more than 50% of the time was spent in consultation regarding the above.

## 2014-07-17 ENCOUNTER — Encounter: Payer: Self-pay | Admitting: *Deleted

## 2014-07-18 LAB — CULTURE, URINE COMPREHENSIVE
Colony Count: NO GROWTH
Organism ID, Bacteria: NO GROWTH

## 2014-07-19 ENCOUNTER — Encounter: Payer: Self-pay | Admitting: Internal Medicine

## 2014-07-19 ENCOUNTER — Other Ambulatory Visit: Payer: Self-pay | Admitting: Internal Medicine

## 2014-07-19 DIAGNOSIS — Z6832 Body mass index (BMI) 32.0-32.9, adult: Secondary | ICD-10-CM | POA: Insufficient documentation

## 2014-07-19 DIAGNOSIS — R319 Hematuria, unspecified: Secondary | ICD-10-CM

## 2014-07-19 DIAGNOSIS — R300 Dysuria: Secondary | ICD-10-CM | POA: Insufficient documentation

## 2014-07-19 NOTE — Assessment & Plan Note (Signed)
Not an issue now.  Blood pressure doing well.

## 2014-07-19 NOTE — Progress Notes (Signed)
Order placed for urinalysis 

## 2014-07-19 NOTE — Assessment & Plan Note (Signed)
On crestor.  Low cholesterol diet.  Check lipid panel and liver function.  Most recent check wnl.

## 2014-07-19 NOTE — Assessment & Plan Note (Signed)
Discussed diet and exercise.  Diet information given.   

## 2014-07-19 NOTE — Assessment & Plan Note (Signed)
Urine dip c/w possible uti.  Treat with cipro.  Send for culture.  Follow.  No vaginal symptoms.

## 2014-07-19 NOTE — Assessment & Plan Note (Addendum)
Resolved.  Monitor for triggers.

## 2014-07-19 NOTE — Assessment & Plan Note (Signed)
Sees Dr Ouida Sills (her gyn).  Most recent paps have been normal.  Up to date.  Just had pap 07/07/14 - negative.

## 2014-07-24 ENCOUNTER — Other Ambulatory Visit (INDEPENDENT_AMBULATORY_CARE_PROVIDER_SITE_OTHER): Payer: Managed Care, Other (non HMO)

## 2014-07-24 DIAGNOSIS — R319 Hematuria, unspecified: Secondary | ICD-10-CM

## 2014-07-24 LAB — URINALYSIS, ROUTINE W REFLEX MICROSCOPIC
Bilirubin Urine: NEGATIVE
Ketones, ur: NEGATIVE
Leukocytes, UA: NEGATIVE
NITRITE: NEGATIVE
TOTAL PROTEIN, URINE-UPE24: NEGATIVE
Urine Glucose: NEGATIVE
Urobilinogen, UA: 0.2 (ref 0.0–1.0)
pH: 5.5 (ref 5.0–8.0)

## 2014-12-08 ENCOUNTER — Emergency Department: Payer: Self-pay | Admitting: Emergency Medicine

## 2014-12-08 ENCOUNTER — Telehealth: Payer: Self-pay | Admitting: Internal Medicine

## 2014-12-08 LAB — URINALYSIS, COMPLETE
Bilirubin,UR: NEGATIVE
GLUCOSE, UR: NEGATIVE mg/dL (ref 0–75)
Ketone: NEGATIVE
Leukocyte Esterase: NEGATIVE
Nitrite: NEGATIVE
Ph: 7 (ref 4.5–8.0)
Protein: NEGATIVE
Specific Gravity: 1.012 (ref 1.003–1.030)

## 2014-12-08 LAB — COMPREHENSIVE METABOLIC PANEL
AST: 25 U/L (ref 15–37)
Albumin: 3.5 g/dL (ref 3.4–5.0)
Alkaline Phosphatase: 68 U/L
Anion Gap: 7 (ref 7–16)
BUN: 11 mg/dL (ref 7–18)
Bilirubin,Total: 0.3 mg/dL (ref 0.2–1.0)
CHLORIDE: 104 mmol/L (ref 98–107)
CREATININE: 0.63 mg/dL (ref 0.60–1.30)
Calcium, Total: 8.8 mg/dL (ref 8.5–10.1)
Co2: 28 mmol/L (ref 21–32)
EGFR (Non-African Amer.): >60
GLUCOSE: 81 mg/dL (ref 65–99)
Osmolality: 276 (ref 275–301)
Potassium: 4.1 mmol/L (ref 3.5–5.1)
SGPT (ALT): 28 U/L
Sodium: 139 mmol/L (ref 136–145)
TOTAL PROTEIN: 8 g/dL (ref 6.4–8.2)

## 2014-12-08 LAB — LIPASE, BLOOD: Lipase: 78 U/L (ref 73–393)

## 2014-12-08 LAB — CBC
HCT: 47.4 % — AB (ref 35.0–47.0)
HGB: 15.1 g/dL (ref 12.0–16.0)
MCH: 28.5 pg (ref 26.0–34.0)
MCHC: 31.8 g/dL — ABNORMAL LOW (ref 32.0–36.0)
MCV: 90 fL (ref 80–100)
PLATELETS: 289 10*3/uL (ref 150–440)
RBC: 5.28 10*6/uL — ABNORMAL HIGH (ref 3.80–5.20)
RDW: 13.3 % (ref 11.5–14.5)
WBC: 7.1 10*3/uL (ref 3.6–11.0)

## 2014-12-08 LAB — TROPONIN I: Troponin-I: <0.02 ng/mL

## 2014-12-08 LAB — D-DIMER(ARMC): D-DIMER: 640 ng/mL

## 2014-12-08 NOTE — Telephone Encounter (Signed)
Noted.  Called.  Pt at ER now.

## 2014-12-08 NOTE — Telephone Encounter (Signed)
FYI

## 2014-12-08 NOTE — Telephone Encounter (Signed)
Pt states that her husband is taking her in just a few. She will call us with an update once she is released.

## 2014-12-08 NOTE — Telephone Encounter (Signed)
Please call and confirm pt went to ER.

## 2014-12-08 NOTE — Telephone Encounter (Signed)
[facility] Patient Name: Alison Rodriguez DOB: 08/20/1961 Initial Comment Caller States she is having shortness of breath, and pain under her right breast. call got disconnected. Nurse Assessment Nurse: Markus Daft, RN, Sherre Poot Date/Time (Eastern Time): 12/08/2014 3:02:48 PM Confirm and document reason for call. If symptomatic, describe symptoms. ---Caller states she is having shortness of breath, and pain under her right breast. On Friday, the pain was severe. Tried Tylenol and Ibuprofen and pain is not as severe. Not tender to touch. Rates the pain now at 7/10. Has the patient traveled out of the country within the last 30 days? ---Not Applicable Does the patient require triage? ---Yes Related visit to physician within the last 2 weeks? ---No Does the PT have any chronic conditions? (i.e. diabetes, asthma, etc.) ---No Did the patient indicate they were pregnant? ---No Guidelines Guideline Title Affirmed Question Affirmed Notes Chest Pain [1] Chest pain lasts > 5 minutes AND [2] age > 50 Tightness type of pain.  Pt refused to call 911, and plans to go to ER now. Final Disposition User Call EMS 911 Now [place], Therapist, sports, [place]

## 2015-01-25 ENCOUNTER — Encounter: Payer: Self-pay | Admitting: Internal Medicine

## 2015-01-25 ENCOUNTER — Ambulatory Visit (INDEPENDENT_AMBULATORY_CARE_PROVIDER_SITE_OTHER): Payer: 59 | Admitting: Internal Medicine

## 2015-01-25 ENCOUNTER — Encounter: Payer: Self-pay | Admitting: *Deleted

## 2015-01-25 VITALS — BP 100/70 | HR 77 | Temp 98.1°F | Ht 59.0 in | Wt 158.0 lb

## 2015-01-25 DIAGNOSIS — R87619 Unspecified abnormal cytological findings in specimens from cervix uteri: Secondary | ICD-10-CM

## 2015-01-25 DIAGNOSIS — E669 Obesity, unspecified: Secondary | ICD-10-CM

## 2015-01-25 DIAGNOSIS — Z0000 Encounter for general adult medical examination without abnormal findings: Secondary | ICD-10-CM | POA: Insufficient documentation

## 2015-01-25 DIAGNOSIS — E7800 Pure hypercholesterolemia, unspecified: Secondary | ICD-10-CM

## 2015-01-25 LAB — CBC WITH DIFFERENTIAL/PLATELET
Basophils Absolute: 0 10*3/uL (ref 0.0–0.1)
Basophils Relative: 0.6 % (ref 0.0–3.0)
Eosinophils Absolute: 0.2 10*3/uL (ref 0.0–0.7)
Eosinophils Relative: 4.7 % (ref 0.0–5.0)
HCT: 43.5 % (ref 36.0–46.0)
Hemoglobin: 14.5 g/dL (ref 12.0–15.0)
Lymphocytes Relative: 39.1 % (ref 12.0–46.0)
Lymphs Abs: 2 10*3/uL (ref 0.7–4.0)
MCHC: 33.3 g/dL (ref 30.0–36.0)
MCV: 86.1 fl (ref 78.0–100.0)
MONO ABS: 0.4 10*3/uL (ref 0.1–1.0)
Monocytes Relative: 8 % (ref 3.0–12.0)
NEUTROS PCT: 47.6 % (ref 43.0–77.0)
Neutro Abs: 2.4 10*3/uL (ref 1.4–7.7)
PLATELETS: 299 10*3/uL (ref 150.0–400.0)
RBC: 5.06 Mil/uL (ref 3.87–5.11)
RDW: 13.6 % (ref 11.5–15.5)
WBC: 5.1 10*3/uL (ref 4.0–10.5)

## 2015-01-25 LAB — LIPID PANEL
Cholesterol: 139 mg/dL (ref 0–200)
HDL: 52.1 mg/dL (ref >39.00)
LDL Cholesterol: 75 mg/dL (ref 0–99)
NONHDL: 86.9
Total CHOL/HDL Ratio: 3
Triglycerides: 58 mg/dL (ref 0.0–149.0)
VLDL: 11.6 mg/dL (ref 0.0–40.0)

## 2015-01-25 LAB — COMPREHENSIVE METABOLIC PANEL
ALBUMIN: 4.1 g/dL (ref 3.5–5.2)
ALT: 15 U/L (ref 0–35)
AST: 15 U/L (ref 0–37)
Alkaline Phosphatase: 56 U/L (ref 39–117)
BUN: 11 mg/dL (ref 6–23)
CALCIUM: 9.3 mg/dL (ref 8.4–10.5)
CHLORIDE: 106 meq/L (ref 96–112)
CO2: 26 meq/L (ref 19–32)
Creatinine, Ser: 0.61 mg/dL (ref 0.40–1.20)
GFR: 132.86 mL/min (ref >60.00)
GLUCOSE: 79 mg/dL (ref 70–99)
Potassium: 3.9 mEq/L (ref 3.5–5.1)
SODIUM: 139 meq/L (ref 135–145)
TOTAL PROTEIN: 7.5 g/dL (ref 6.0–8.3)
Total Bilirubin: 0.4 mg/dL (ref 0.2–1.2)

## 2015-01-25 LAB — TSH: TSH: 1.19 u[IU]/mL (ref 0.35–4.50)

## 2015-01-25 MED ORDER — ROSUVASTATIN CALCIUM 10 MG PO TABS: 10.0000 mg | ORAL_TABLET | Freq: Every day | ORAL | Status: DC

## 2015-01-25 NOTE — Assessment & Plan Note (Signed)
Gets her gyn exams through Dr Tonette Bihari office.  Colonoscopy 2013. Mammogram 06/17/14 - Birads I.  PAP 07/08/14 - ok (no HPV).

## 2015-01-25 NOTE — Assessment & Plan Note (Signed)
On crestor.  Continue low cholesterol diet and exercise.  Check lipid panel and liver function tests.

## 2015-01-25 NOTE — Progress Notes (Signed)
Pre visit review using our clinic review tool, if applicable. No additional management support is needed unless otherwise documented below in the visit note. 

## 2015-01-25 NOTE — Progress Notes (Signed)
Patient ID: Alison Rodriguez, female   DOB: [birth year], 54 y.o.   MRN: [id number]   Subjective:    Patient ID: Alison Rodriguez, female    DOB: 03/18/1961, 54 y.o.   MRN: [id number]  HPI  Patient here for a scheduled follow up.  Has a history of hypercholesterolemia.  On crestor.  Cholesterol has been doing well.  She is interested in possibly coming off the medication.  She has not been as compliant with diet and exercise.  Plans to get back in a routine with this.  Does try to stay active.  No cardiac symptoms reported with increased activity or exertion.  Breathing stable.  Bowels stable.     Past Medical History  Diagnosis Date  . Hypertension   . Hyperlipidemia   . Thyroid disease   . Blood in stool     H/O    Outpatient Encounter Prescriptions as of 01/25/2015  Medication Sig  . metronidazole (NORITATE) 1 % cream Apply topically daily.  . rosuvastatin (CRESTOR) 10 MG tablet Take 1 tablet (10 mg total) by mouth daily.  . [DISCONTINUED] rosuvastatin (CRESTOR) 10 MG tablet Take 1 tablet (10 mg total) by mouth daily.  . [DISCONTINUED] ciprofloxacin (CIPRO) 500 MG tablet Take 1 tablet (500 mg total) by mouth 2 (two) times daily.  . [DISCONTINUED] mupirocin cream (BACTROBAN) 2 % Apply 1 application topically 2 (two) times daily. Generic only,  Use for 7-10 day.  Notify me if persistent lesion    Review of Systems  Constitutional: Negative for appetite change and unexpected weight change.  HENT: Negative for congestion and sinus pressure.   Respiratory: Negative for cough, chest tightness and shortness of breath.   Cardiovascular: Negative for chest pain, palpitations and leg swelling.  Gastrointestinal: Negative for nausea, vomiting and abdominal pain.  Neurological: Negative for dizziness, light-headedness and headaches.       Objective:    Physical Exam  Constitutional: She appears well-developed and well-nourished. No distress.  HENT:  Nose: Nose normal.  Mouth/Throat:  Oropharynx is clear and moist.  Neck: Neck supple. No thyromegaly present.  Cardiovascular: Normal rate and regular rhythm.   Pulmonary/Chest: Breath sounds normal. No respiratory distress. She has no wheezes.  Abdominal: Soft. Bowel sounds are normal. There is no tenderness.  Musculoskeletal: She exhibits no edema or tenderness.  Lymphadenopathy:    She has no cervical adenopathy.    BP 100/70 mmHg  Pulse 77  Temp(Src) 98.1 F (36.7 C) (Oral)  Ht 4\' 11"  (1.499 m)  Wt 158 lb (71.668 kg)  BMI 31.89 kg/m2  SpO2 98% Wt Readings from Last 3 Encounters:  01/25/15 158 lb (71.668 kg)  07/16/14 158 lb (71.668 kg)  01/05/14 153 lb 12 oz (69.741 kg)     Lab Results  Component Value Date   WBC 6.7 11/26/2013   HGB 14.6 11/26/2013   HCT 43.0 11/26/2013   PLT 308.0 11/26/2013   GLUCOSE 86 07/16/2014   CHOL 135 07/16/2014   TRIG 47.0 07/16/2014   HDL 54.30 07/16/2014   LDLCALC 71 07/16/2014   ALT 18 07/16/2014   AST 17 07/16/2014   NA 141 07/16/2014   K 4.3 07/16/2014   CL 108 07/16/2014   CREATININE 0.6 07/16/2014   BUN 10 07/16/2014   CO2 27 07/16/2014   TSH 0.74 11/26/2013    Mm Screening Breast Tomo Bilateral  06/17/2014   CLINICAL DATA:  Screening.  EXAM: DIGITAL SCREENING BILATERAL MAMMOGRAM WITH 3D TOMO WITH CAD  COMPARISON:  Previous exam(s).  ACR Breast Density Category d: The breast tissue is extremely dense, which lowers the sensitivity of mammography.  FINDINGS: There are no findings suspicious for malignancy. Images were processed with CAD. Suggestive continued decrease in size of right breast mass 5 o'clock location compatible with involuting fibroadenoma given long-term stability in benign appearance and decrease in size.  IMPRESSION: No mammographic evidence of malignancy. A result letter of this screening mammogram will be mailed directly to the patient.  RECOMMENDATION: Screening mammogram in one year. (Code:SM-B-01Y)  BI-RADS CATEGORY  1: Negative.    Electronically Signed   By: Conchita Paris M.D.   On: 06/17/2014 11:02       Assessment & Plan:   Problem List Items Addressed This Visit    Abnormal Pap smear of cervix   Health care maintenance    Gets her gyn exams through Dr Tonette Bihari office.  Colonoscopy 2013. Mammogram 06/17/14 - Birads I.  PAP 07/08/14 - ok (no HPV).        Hypercholesterolemia - Primary    On crestor.  Continue low cholesterol diet and exercise.  Check lipid panel and liver function tests.        Relevant Medications   rosuvastatin (CRESTOR) tablet   Other Relevant Orders   Lipid panel   TSH   Comprehensive metabolic panel   CBC with Differential/Platelet   Obesity (BMI 30.0-34.9)    Discussed diet and exercise.            Einar Pheasant, MD

## 2015-01-25 NOTE — Assessment & Plan Note (Signed)
Discussed diet and exercise 

## 2015-06-07 ENCOUNTER — Other Ambulatory Visit: Payer: Self-pay

## 2015-06-07 DIAGNOSIS — Z1231 Encounter for screening mammogram for malignant neoplasm of breast: Secondary | ICD-10-CM

## 2015-07-13 ENCOUNTER — Ambulatory Visit
Admission: RE | Admit: 2015-07-13 | Discharge: 2015-07-13 | Disposition: A | Discharge status: Home / Self Care | Payer: 59 | Source: Ambulatory Visit

## 2015-07-13 ENCOUNTER — Other Ambulatory Visit: Payer: Self-pay | Admitting: Obstetrics and Gynecology

## 2015-07-13 DIAGNOSIS — Z1231 Encounter for screening mammogram for malignant neoplasm of breast: Secondary | ICD-10-CM

## 2015-07-14 LAB — CYTOLOGY - PAP

## 2015-08-02 ENCOUNTER — Encounter: Payer: Self-pay | Admitting: Internal Medicine

## 2015-08-02 ENCOUNTER — Ambulatory Visit (INDEPENDENT_AMBULATORY_CARE_PROVIDER_SITE_OTHER): Payer: 59 | Admitting: Internal Medicine

## 2015-08-02 VITALS — BP 110/60 | HR 67 | Temp 97.7°F | Ht 59.0 in | Wt 161.5 lb

## 2015-08-02 DIAGNOSIS — R030 Elevated blood-pressure reading, without diagnosis of hypertension: Secondary | ICD-10-CM

## 2015-08-02 DIAGNOSIS — E7800 Pure hypercholesterolemia, unspecified: Secondary | ICD-10-CM

## 2015-08-02 DIAGNOSIS — R87619 Unspecified abnormal cytological findings in specimens from cervix uteri: Secondary | ICD-10-CM

## 2015-08-02 DIAGNOSIS — E669 Obesity, unspecified: Secondary | ICD-10-CM

## 2015-08-02 DIAGNOSIS — L299 Pruritus, unspecified: Secondary | ICD-10-CM

## 2015-08-02 LAB — COMPREHENSIVE METABOLIC PANEL
ALBUMIN: 3.9 g/dL (ref 3.5–5.2)
ALT: 17 U/L (ref 0–35)
AST: 15 U/L (ref 0–37)
Alkaline Phosphatase: 58 U/L (ref 39–117)
BUN: 15 mg/dL (ref 6–23)
CHLORIDE: 108 meq/L (ref 96–112)
CO2: 27 meq/L (ref 19–32)
Calcium: 9.1 mg/dL (ref 8.4–10.5)
Creatinine, Ser: 0.54 mg/dL (ref 0.40–1.20)
GFR: 152.61 mL/min (ref >60.00)
Glucose, Bld: 85 mg/dL (ref 70–99)
POTASSIUM: 4 meq/L (ref 3.5–5.1)
SODIUM: 141 meq/L (ref 135–145)
Total Bilirubin: 0.3 mg/dL (ref 0.2–1.2)
Total Protein: 7.5 g/dL (ref 6.0–8.3)

## 2015-08-02 LAB — LIPID PANEL
CHOL/HDL RATIO: 3
CHOLESTEROL: 145 mg/dL (ref 0–200)
HDL: 52.7 mg/dL (ref >39.00)
LDL CALC: 83 mg/dL (ref 0–99)
NonHDL: 92.51
Triglycerides: 46 mg/dL (ref 0.0–149.0)
VLDL: 9.2 mg/dL (ref 0.0–40.0)

## 2015-08-02 MED ORDER — METRONIDAZOLE 0.75 % EX CREA: 1.0000 "application " | TOPICAL_CREAM | Freq: Two times a day (BID) | CUTANEOUS | Status: DC

## 2015-08-02 NOTE — Progress Notes (Signed)
Patient ID: Alison Rodriguez, female   DOB: 06-27-1961, 54 y.o.   MRN: [id number]   Subjective:    Patient ID: Alison Rodriguez, female    DOB: [birth year], 23 y.o.   MRN: [id number]  HPI  Patient here for a scheduled follow up.  She reports she has noticed some itching - patch mid back.  No rash.  Had been present for the past month.  Not present for the last two days.  No fever.  Has been exercising.  No cardiac symptoms with increased activity or exertion.  No sob.  No acid reflux reported.  Up to date with her pap smear and mammograms.  Bowels stable.  Had colonoscopy at [facility] - approximately five years ago.     Past Medical History  Diagnosis Date  . Hypertension   . Hyperlipidemia   . Thyroid disease   . Blood in stool     H/O   Past Surgical History  Procedure Laterality Date  . Uterine fibroid embolization     Family History  Problem Relation Age of Onset  . Diabetes Mother   . Diabetes Maternal Grandmother   . Arthritis Maternal Grandfather   . Diabetes Maternal Grandfather   . Breast cancer      second cousins/aunt   Social History   Social History  . Marital Status: Married    Spouse Name: N/A  . Number of Children: N/A  . Years of Education: N/A   Social History Main Topics  . Smoking status: Never Smoker   . Smokeless tobacco: Never Used  . Alcohol Use: No  . Drug Use: No  . Sexual Activity: Not Asked   Other Topics Concern  . None   Social History Narrative    Outpatient Encounter Prescriptions as of 08/02/2015  Medication Sig  . hydroquinone 4 % cream APPLY TO DARK SPOTS ON FACE AS NEEDED  . metroNIDAZOLE (METROCREAM) 0.75 % cream Apply 1 application topically 2 (two) times daily.  . rosuvastatin (CRESTOR) 10 MG tablet Take 1 tablet (10 mg total) by mouth daily.  . [DISCONTINUED] metroNIDAZOLE (METROCREAM) 0.75 % cream Apply 1 application topically daily.  . [DISCONTINUED] metronidazole (NORITATE) 1 % cream Apply topically daily.   No  facility-administered encounter medications on file as of 08/02/2015.    Review of Systems  Constitutional: Negative for appetite change and unexpected weight change.  HENT: Negative for congestion and sinus pressure.   Eyes: Negative for pain and visual disturbance.  Respiratory: Negative for cough, chest tightness and shortness of breath.   Cardiovascular: Negative for chest pain, palpitations and leg swelling.  Gastrointestinal: Negative for nausea, vomiting, abdominal pain and diarrhea.  Genitourinary: Negative for dysuria and difficulty urinating.  Musculoskeletal: Negative for back pain and joint swelling.  Skin: Negative for color change and rash.       Itching - mid back.   Neurological: Negative for dizziness, light-headedness and headaches.  Hematological: Negative for adenopathy. Does not bruise/bleed easily.  Psychiatric/Behavioral: Negative for dysphoric mood and agitation.       Objective:    Physical Exam  Constitutional: She appears well-developed and well-nourished. No distress.  HENT:  Nose: Nose normal.  Mouth/Throat: Oropharynx is clear and moist.  Eyes: Right eye exhibits no discharge. Left eye exhibits no discharge.  Neck: Neck supple. No thyromegaly present.  Cardiovascular: Normal rate and regular rhythm.   Pulmonary/Chest: Breath sounds normal. No respiratory distress. She has no wheezes.  Abdominal: Soft. Bowel sounds are normal. There is no tenderness.  Musculoskeletal: She exhibits no edema or tenderness.  Lymphadenopathy:    She has no cervical adenopathy.  Skin: No rash noted. No erythema.  Psychiatric: She has a normal mood and affect. Her behavior is normal.    BP 110/60 mmHg  Pulse 67  Temp(Src) 97.7 F (36.5 C) (Oral)  Ht 4\' 11"  (1.499 m)  Wt 161 lb 8 oz (73.256 kg)  BMI 32.60 kg/m2  SpO2 98%  LMP 06/05/2015 Wt Readings from Last 3 Encounters:  08/02/15 161 lb 8 oz (73.256 kg)  01/25/15 158 lb (71.668 kg)  07/16/14 158 lb (71.668 kg)      Lab Results  Component Value Date   WBC 5.1 01/25/2015   HGB 14.5 01/25/2015   HCT 43.5 01/25/2015   PLT 299.0 01/25/2015   GLUCOSE 85 08/02/2015   CHOL 145 08/02/2015   TRIG 46.0 08/02/2015   HDL 52.70 08/02/2015   LDLCALC 83 08/02/2015   ALT 17 08/02/2015   AST 15 08/02/2015   NA 141 08/02/2015   K 4.0 08/02/2015   CL 108 08/02/2015   CREATININE 0.54 08/02/2015   BUN 15 08/02/2015   CO2 27 08/02/2015   TSH 1.19 01/25/2015    Mm Screening Breast Tomo Bilateral  07/14/2015   CLINICAL DATA:  Screening.  EXAM: DIGITAL SCREENING BILATERAL MAMMOGRAM WITH 3D TOMO WITH CAD  COMPARISON:  Previous exam(s).  ACR Breast Density Category c: The breast tissue is heterogeneously dense, which may obscure small masses.  FINDINGS: There are no findings suspicious for malignancy. Images were processed with CAD.  IMPRESSION: No mammographic evidence of malignancy. A result letter of this screening mammogram will be mailed directly to the patient.  RECOMMENDATION: Screening mammogram in one year. (Code:SM-B-01Y)  BI-RADS CATEGORY  1: Negative.   Electronically Signed   By: Altamese Cabal M.D.   On: 07/14/2015 10:34       Assessment & Plan:   Problem List Items Addressed This Visit    Abnormal Pap smear of cervix    Sees Dr Ouida Sills (gyn).  Most recent paps have been normal.  Last pap 07/07/14 - negative.       Blood pressure elevated without history of HTN    Blood pressure doing well.  Follow.        Hypercholesterolemia - Primary    Low cholesterol diet and exercise.  On crestor.  Follow lipid panel and liver function tests.       Relevant Orders   Lipid panel (Completed)   Comprehensive metabolic panel (Completed)   Itch    Previous itching - patch on her back.  Resolved.  No rash.  Follow.       Obesity (BMI 30.0-34.9)    Diet and exercise.  Follow.           Einar Pheasant, MD

## 2015-08-02 NOTE — Progress Notes (Signed)
Pre-visit discussion using our clinic review tool. No additional management support is needed unless otherwise documented below in the visit note.  

## 2015-08-03 ENCOUNTER — Encounter: Payer: Self-pay | Admitting: *Deleted

## 2015-08-07 ENCOUNTER — Other Ambulatory Visit: Payer: Self-pay | Admitting: Internal Medicine

## 2015-08-08 ENCOUNTER — Encounter: Payer: Self-pay | Admitting: Internal Medicine

## 2015-08-08 DIAGNOSIS — L299 Pruritus, unspecified: Secondary | ICD-10-CM | POA: Insufficient documentation

## 2015-08-08 NOTE — Assessment & Plan Note (Signed)
Previous itching - patch on her back.  Resolved.  No rash.  Follow.

## 2015-08-08 NOTE — Assessment & Plan Note (Signed)
Diet and exercise.  Follow.  

## 2015-08-08 NOTE — Assessment & Plan Note (Signed)
Low cholesterol diet and exercise.  On crestor.  Follow lipid panel and liver function tests.  

## 2015-08-08 NOTE — Assessment & Plan Note (Signed)
Blood pressure doing well.  Follow.  

## 2015-08-08 NOTE — Assessment & Plan Note (Signed)
Sees Dr Ouida Sills (gyn).  Most recent paps have been normal.  Last pap 07/07/14 - negative.

## 2015-12-01 ENCOUNTER — Telehealth: Payer: Self-pay | Admitting: Internal Medicine

## 2015-12-01 ENCOUNTER — Other Ambulatory Visit: Payer: Self-pay | Admitting: Internal Medicine

## 2015-12-01 DIAGNOSIS — R9389 Abnormal findings on diagnostic imaging of other specified body structures: Secondary | ICD-10-CM

## 2015-12-01 NOTE — Progress Notes (Signed)
Order placed for f/u chest CT.  Pt prefers Monday.

## 2015-12-01 NOTE — Telephone Encounter (Signed)
Opened in error

## 2015-12-13 ENCOUNTER — Ambulatory Visit
Admission: RE | Admit: 2015-12-13 | Discharge: 2015-12-13 | Disposition: A | Discharge status: Home / Self Care | Payer: 59 | Source: Ambulatory Visit | Attending: Internal Medicine | Admitting: Internal Medicine

## 2015-12-13 DIAGNOSIS — R938 Abnormal findings on diagnostic imaging of other specified body structures: Secondary | ICD-10-CM | POA: Insufficient documentation

## 2015-12-13 DIAGNOSIS — R9389 Abnormal findings on diagnostic imaging of other specified body structures: Secondary | ICD-10-CM

## 2015-12-13 MED ORDER — IOHEXOL 300 MG/ML  SOLN
75.0000 mL | Freq: Once | INTRAMUSCULAR | Status: AC | PRN
  Administered 2015-12-13: 75 mL via INTRAVENOUS

## 2016-01-25 IMAGING — CR DG CHEST 2V
1 series · 2 of 2 positions shown · non-contrast
Comparison: None.

CLINICAL DATA: Shortness of breath for 4 days with chest pain on
inspiration, initial encounter

EXAM:
CHEST  2 VIEW

[Series 1: w chest pa · 0.14mm/px · 2 of 2 slices shown]
[im 1/2]
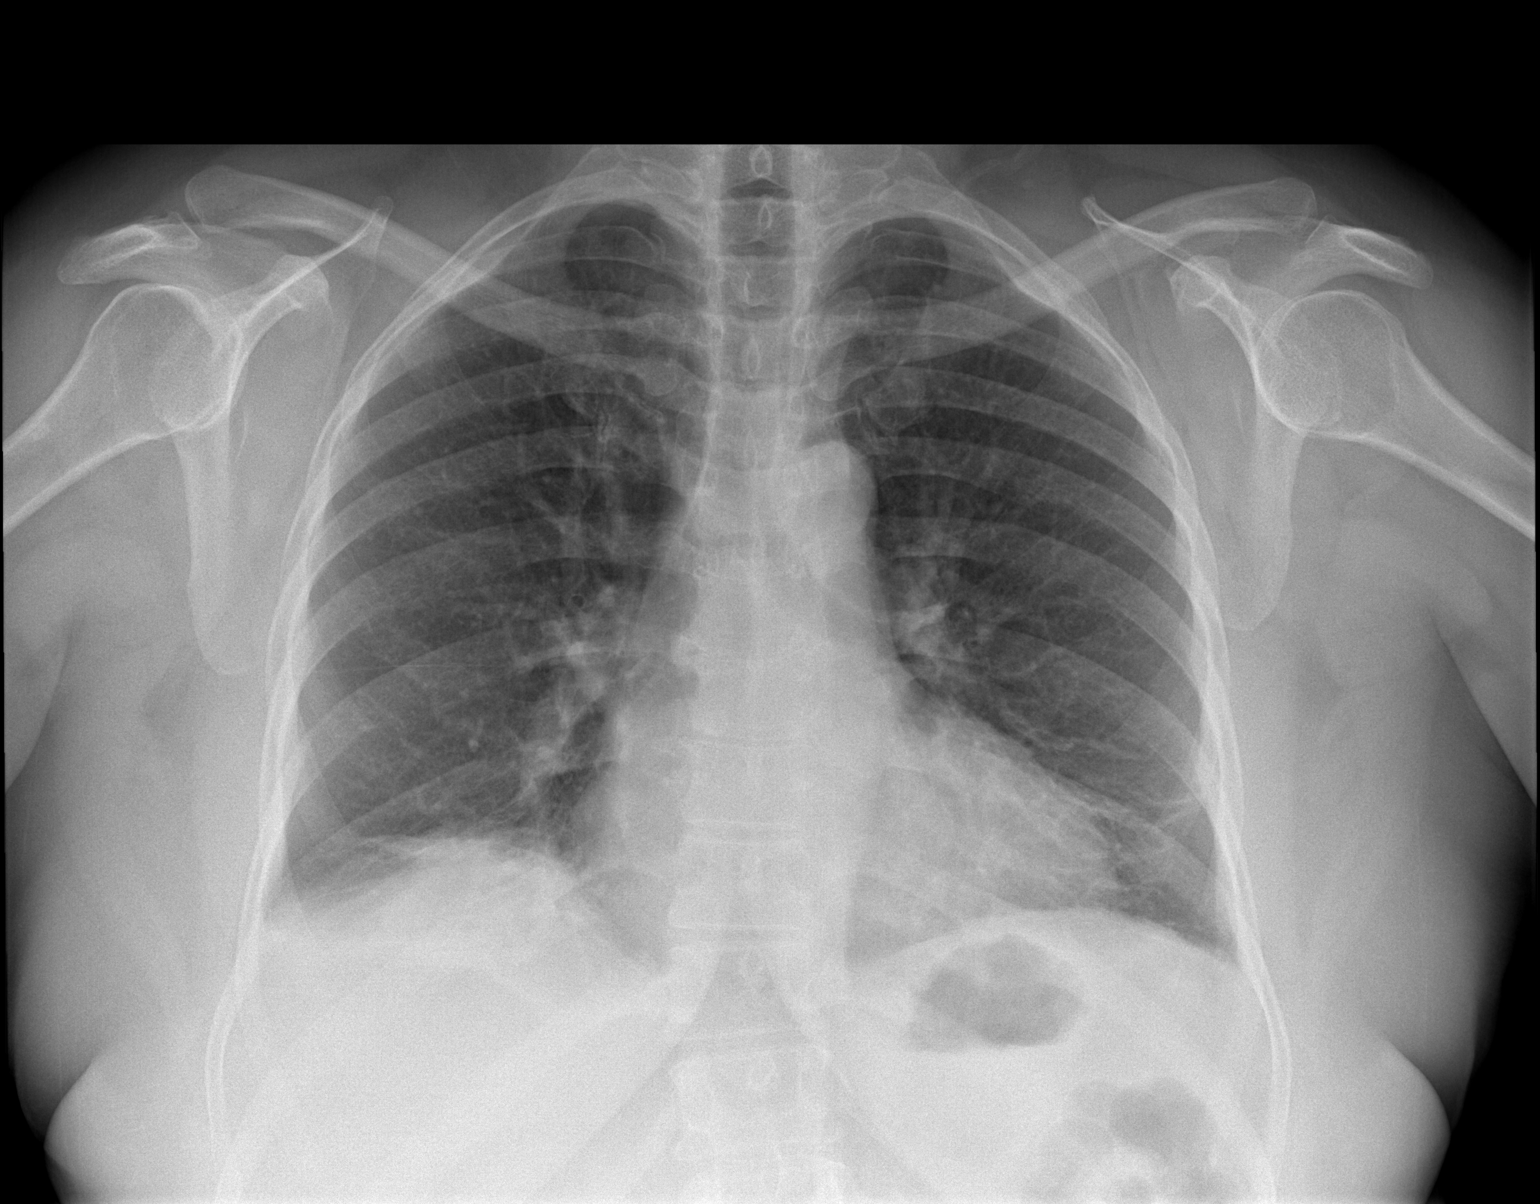
[im 2/2]
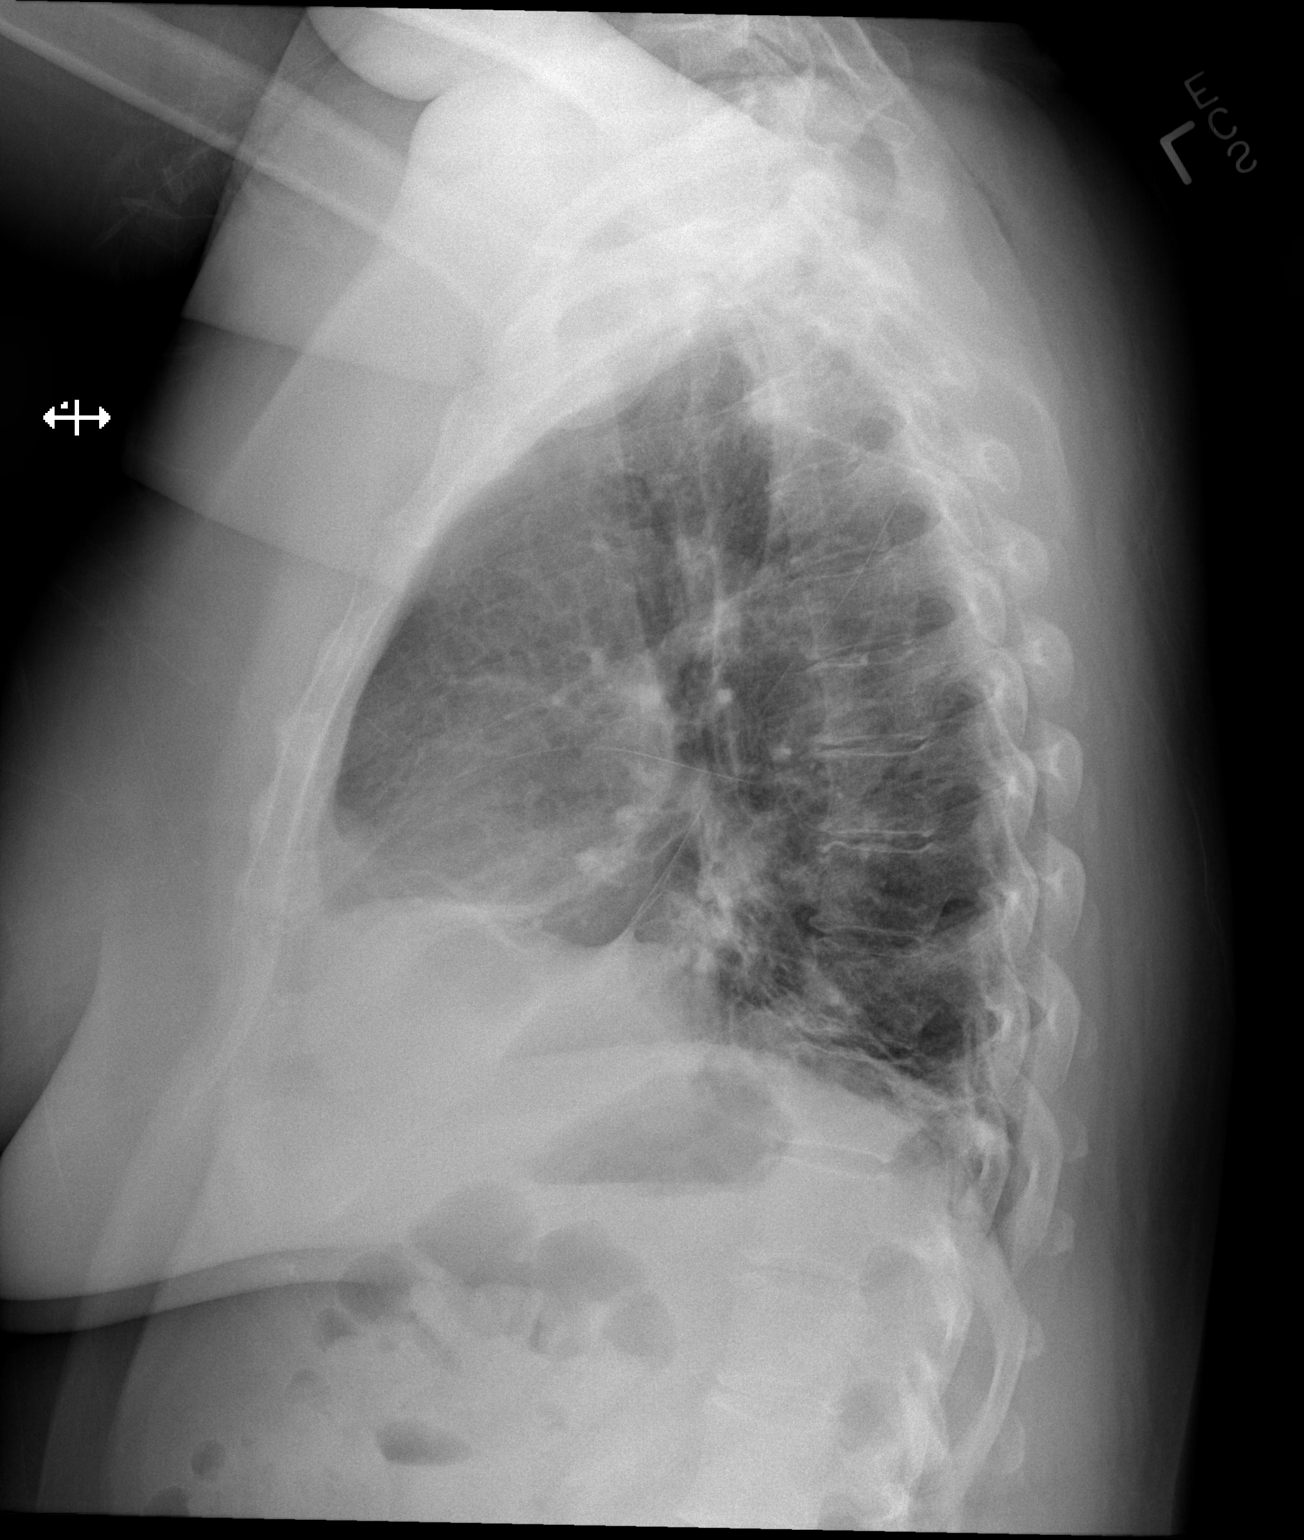

[2 of 2 positions shown; findings below may reference images not displayed]

FINDINGS: Cardiac shadow is within normal limits. Bibasilar atelectasis/early
infiltrate is identified worse on the right than the left. No
sizable effusion is seen. No bony abnormality is noted.
IMPRESSION: Bibasilar changes right greater than left. Followup films following
appropriate therapy recommended.

## 2016-01-31 ENCOUNTER — Ambulatory Visit (INDEPENDENT_AMBULATORY_CARE_PROVIDER_SITE_OTHER): Payer: 59 | Admitting: Internal Medicine

## 2016-01-31 ENCOUNTER — Encounter: Payer: Self-pay | Admitting: Internal Medicine

## 2016-01-31 VITALS — BP 110/70 | HR 67 | Temp 98.6°F | Resp 18 | Ht 59.0 in | Wt 152.5 lb

## 2016-01-31 DIAGNOSIS — E7800 Pure hypercholesterolemia, unspecified: Secondary | ICD-10-CM | POA: Diagnosis not present

## 2016-01-31 DIAGNOSIS — R87619 Unspecified abnormal cytological findings in specimens from cervix uteri: Secondary | ICD-10-CM

## 2016-01-31 DIAGNOSIS — L299 Pruritus, unspecified: Secondary | ICD-10-CM

## 2016-01-31 DIAGNOSIS — E669 Obesity, unspecified: Secondary | ICD-10-CM

## 2016-01-31 DIAGNOSIS — R938 Abnormal findings on diagnostic imaging of other specified body structures: Secondary | ICD-10-CM

## 2016-01-31 DIAGNOSIS — R9389 Abnormal findings on diagnostic imaging of other specified body structures: Secondary | ICD-10-CM

## 2016-01-31 LAB — LIPID PANEL
CHOL/HDL RATIO: 3
CHOLESTEROL: 140 mg/dL (ref 0–200)
HDL: 49.9 mg/dL (ref >39.00)
LDL Cholesterol: 78 mg/dL (ref 0–99)
NONHDL: 90.06
TRIGLYCERIDES: 59 mg/dL (ref 0.0–149.0)
VLDL: 11.8 mg/dL (ref 0.0–40.0)

## 2016-01-31 LAB — CBC WITH DIFFERENTIAL/PLATELET
BASOS PCT: 0.8 % (ref 0.0–3.0)
Basophils Absolute: 0 10*3/uL (ref 0.0–0.1)
EOS PCT: 3.9 % (ref 0.0–5.0)
Eosinophils Absolute: 0.2 10*3/uL (ref 0.0–0.7)
HEMATOCRIT: 42.6 % (ref 36.0–46.0)
Hemoglobin: 13.9 g/dL (ref 12.0–15.0)
LYMPHS PCT: 34.4 % (ref 12.0–46.0)
Lymphs Abs: 2 10*3/uL (ref 0.7–4.0)
MCHC: 32.6 g/dL (ref 30.0–36.0)
MCV: 86.5 fl (ref 78.0–100.0)
MONOS PCT: 6.3 % (ref 3.0–12.0)
Monocytes Absolute: 0.4 10*3/uL (ref 0.1–1.0)
NEUTROS ABS: 3.1 10*3/uL (ref 1.4–7.7)
Neutrophils Relative %: 54.6 % (ref 43.0–77.0)
PLATELETS: 256 10*3/uL (ref 150.0–400.0)
RBC: 4.92 Mil/uL (ref 3.87–5.11)
RDW: 14 % (ref 11.5–15.5)
WBC: 5.8 10*3/uL (ref 4.0–10.5)

## 2016-01-31 LAB — HEPATIC FUNCTION PANEL
ALK PHOS: 49 U/L (ref 39–117)
ALT: 13 U/L (ref 0–35)
AST: 16 U/L (ref 0–37)
Albumin: 4.2 g/dL (ref 3.5–5.2)
BILIRUBIN DIRECT: 0.1 mg/dL (ref 0.0–0.3)
BILIRUBIN TOTAL: 0.4 mg/dL (ref 0.2–1.2)
Total Protein: 7.5 g/dL (ref 6.0–8.3)

## 2016-01-31 LAB — BASIC METABOLIC PANEL
BUN: 13 mg/dL (ref 6–23)
CALCIUM: 9.5 mg/dL (ref 8.4–10.5)
CHLORIDE: 108 meq/L (ref 96–112)
CO2: 24 meq/L (ref 19–32)
CREATININE: 0.55 mg/dL (ref 0.40–1.20)
GFR: 149.12 mL/min (ref >60.00)
Glucose, Bld: 74 mg/dL (ref 70–99)
Potassium: 3.8 mEq/L (ref 3.5–5.1)
SODIUM: 142 meq/L (ref 135–145)

## 2016-01-31 LAB — TSH: TSH: 1.43 u[IU]/mL (ref 0.35–4.50)

## 2016-01-31 NOTE — Progress Notes (Signed)
Patient ID: Alison Rodriguez, female   DOB: 04/03/1961, 55 y.o.   MRN: [id number]   Subjective:    Patient ID: Alison Rodriguez, female    DOB: 02-08-1961, 55 y.o.   MRN: [id number]  HPI  Patient with past history of hypercholesterolemia and elevated blood pressure.  She comes in today to follow up on these issues.  She states she is doing well.  Feels good. Stays active.  No cardiac symptoms with increased activity or exertion.  No sob.  No abdominal pain or cramping.  Bowels stable.  Increased stress with her job.  Does not feel needs any further intervention at this time.  Persistent intermittent itching - mid to upper back.  No rash.  Intermittent.  Does not last long.  Was questioning if related to stress.  States up to date with gyn exams.     Past Medical History  Diagnosis Date  . Hypertension   . Hyperlipidemia   . Thyroid disease   . Blood in stool     H/O   Past Surgical History  Procedure Laterality Date  . Uterine fibroid embolization     Family History  Problem Relation Age of Onset  . Diabetes Mother   . Diabetes Maternal Grandmother   . Arthritis Maternal Grandfather   . Diabetes Maternal Grandfather   . Breast cancer      second cousins/aunt   Social History   Social History  . Marital Status: Married    Spouse Name: N/A  . Number of Children: N/A  . Years of Education: N/A   Social History Main Topics  . Smoking status: Never Smoker   . Smokeless tobacco: Never Used  . Alcohol Use: No  . Drug Use: No  . Sexual Activity: Not Asked   Other Topics Concern  . None   Social History Narrative    Outpatient Encounter Prescriptions as of 01/31/2016  Medication Sig  . rosuvastatin (CRESTOR) 10 MG tablet TAKE ONE TABLET BY MOUTH ONE TIME DAILY  . [DISCONTINUED] hydroquinone 4 % cream Reported on 01/31/2016  . [DISCONTINUED] metroNIDAZOLE (METROCREAM) 0.75 % cream Apply 1 application topically 2 (two) times daily. (Patient not taking: Reported on 01/31/2016)     No facility-administered encounter medications on file as of 01/31/2016.    Review of Systems  Constitutional: Negative for appetite change and unexpected weight change.  HENT: Negative for congestion and sinus pressure.   Respiratory: Negative for cough, chest tightness and shortness of breath.   Cardiovascular: Negative for chest pain, palpitations and leg swelling.  Gastrointestinal: Negative for nausea, vomiting, abdominal pain and diarrhea.  Genitourinary: Negative for dysuria and difficulty urinating.  Musculoskeletal: Negative for back pain and joint swelling.  Skin: Negative for color change and rash.       Itching as outlined.   Neurological: Negative for dizziness, light-headedness and headaches.  Psychiatric/Behavioral: Negative for dysphoric mood and agitation.       Objective:     Blood pressure rechecked by me:  118/72  Physical Exam  Constitutional: She appears well-developed and well-nourished. No distress.  HENT:  Nose: Nose normal.  Mouth/Throat: Oropharynx is clear and moist.  Eyes: Conjunctivae are normal. Right eye exhibits no discharge. Left eye exhibits no discharge.  Neck: Neck supple. No thyromegaly present.  Cardiovascular: Normal rate and regular rhythm.   Pulmonary/Chest: Breath sounds normal. No respiratory distress. She has no wheezes.  Abdominal: Soft. Bowel sounds are normal. There is no tenderness.  Musculoskeletal: She exhibits no edema or  tenderness.  Lymphadenopathy:    She has no cervical adenopathy.  Skin: No rash noted. No erythema.  Psychiatric: She has a normal mood and affect.    BP 110/70 mmHg  Pulse 67  Temp(Src) 98.6 F (37 C) (Oral)  Resp 18  Ht 4\' 11"  (1.499 m)  Wt 152 lb 8 oz (69.174 kg)  BMI 30.79 kg/m2  SpO2 99% Wt Readings from Last 3 Encounters:  01/31/16 152 lb 8 oz (69.174 kg)  08/02/15 161 lb 8 oz (73.256 kg)  01/25/15 158 lb (71.668 kg)     Lab Results  Component Value Date   WBC 5.8 01/31/2016   HGB  13.9 01/31/2016   HCT 42.6 01/31/2016   PLT 256.0 01/31/2016   GLUCOSE 74 01/31/2016   CHOL 140 01/31/2016   TRIG 59.0 01/31/2016   HDL 49.90 01/31/2016   LDLCALC 78 01/31/2016   ALT 13 01/31/2016   AST 16 01/31/2016   NA 142 01/31/2016   K 3.8 01/31/2016   CL 108 01/31/2016   CREATININE 0.55 01/31/2016   BUN 13 01/31/2016   CO2 24 01/31/2016   TSH 1.43 01/31/2016    Ct Chest W Contrast  12/13/2015  CLINICAL DATA:  Thymic nodularity on previous CT. No history of malignancy. EXAM: CT CHEST WITH CONTRAST TECHNIQUE: Multidetector CT imaging of the chest was performed during intravenous contrast administration. CONTRAST:  70mL OMNIPAQUE IOHEXOL 300 MG/ML  SOLN COMPARISON:  Chest CT 12/08/2014.  Mammography report 07/13/2015. FINDINGS: Mediastinum/Nodes: There are no enlarged mediastinal, hilar or axillary lymph nodes.There is stable mild soft tissue nodularity within the pre-vascular fat, again most consistent with thymic tissue. There is minimal thyroid nodularity. The esophagus appears normal. The heart size is normal. There is no pericardial effusion. There are no significant vascular findings. Lungs/Pleura: There is no pleural effusion. The subsegmental atelectasis previously noted at both lung bases has largely cleared. There are no suspicious pulmonary findings. Upper abdomen:  Stable without suspicious findings. Musculoskeletal/Chest wall: There is no chest wall mass or suspicious osseous finding. Asymmetric subareolar density in the right breast is grossly unchanged. IMPRESSION: 1. Stable appearance of the anterior mediastinal fat attributed to residual thymic tissue. No suspicious mediastinal findings. 2. Interval near-complete clearing of previously demonstrated bibasilar atelectasis. 3. Grossly stable asymmetric density anteriorly in the right breast, nonspecific by CT, but previously evaluated by mammography. 4. No acute chest findings. Electronically Signed   By: Richardean Sale M.D.    On: 12/13/2015 08:51       Assessment & Plan:   Problem List Items Addressed This Visit    Abnormal chest CT    F/u chest CT as outlined.  Stable appearance of the anterior mediastinal fat.  No suspicious mediastinal findings.         Abnormal Pap smear of cervix    Sees Dr Ouida Sills (gyn).  Most recent paps have been normal.  PAP 07/07/14 - negative.        Hypercholesterolemia    On crestor.  Low cholesterol diet and exercise.  Follow lipid panel and liver function tests.        Relevant Orders   Lipid panel (Completed)   Hepatic function panel (Completed)   Basic metabolic panel (Completed)   Itch - Primary    No rash.  Intermittent itching.  Will follow and see if stress related.        Relevant Orders   TSH (Completed)   CBC with Differential/Platelet (Completed)   Obesity (BMI 30.0-34.9)  Diet and exercise.  Follow.            Einar Pheasant, MD

## 2016-01-31 NOTE — Progress Notes (Signed)
Pre-visit discussion using our clinic review tool. No additional management support is needed unless otherwise documented below in the visit note.  

## 2016-02-01 ENCOUNTER — Encounter: Payer: Self-pay | Admitting: *Deleted

## 2016-02-07 ENCOUNTER — Encounter: Payer: Self-pay | Admitting: Internal Medicine

## 2016-02-07 DIAGNOSIS — R9389 Abnormal findings on diagnostic imaging of other specified body structures: Secondary | ICD-10-CM | POA: Insufficient documentation

## 2016-02-07 NOTE — Assessment & Plan Note (Signed)
Sees Dr Ouida Sills (gyn).  Most recent paps have been normal.  PAP 07/07/14 - negative.

## 2016-02-07 NOTE — Assessment & Plan Note (Signed)
No rash.  Intermittent itching.  Will follow and see if stress related.

## 2016-02-07 NOTE — Assessment & Plan Note (Signed)
On crestor.  Low cholesterol diet and exercise.  Follow lipid panel and liver function tests.   

## 2016-02-07 NOTE — Assessment & Plan Note (Signed)
Diet and exercise.  Follow.  

## 2016-02-07 NOTE — Assessment & Plan Note (Signed)
F/u chest CT as outlined.  Stable appearance of the anterior mediastinal fat.  No suspicious mediastinal findings.

## 2016-03-09 ENCOUNTER — Other Ambulatory Visit: Payer: Self-pay | Admitting: Internal Medicine

## 2016-06-22 ENCOUNTER — Other Ambulatory Visit: Payer: Self-pay | Admitting: Obstetrics and Gynecology

## 2016-06-22 DIAGNOSIS — Z1231 Encounter for screening mammogram for malignant neoplasm of breast: Secondary | ICD-10-CM

## 2016-07-14 ENCOUNTER — Ambulatory Visit
Admission: RE | Admit: 2016-07-14 | Discharge: 2016-07-14 | Disposition: A | Discharge status: Home / Self Care | Payer: 59 | Source: Ambulatory Visit | Attending: Obstetrics and Gynecology | Admitting: Obstetrics and Gynecology

## 2016-07-14 DIAGNOSIS — Z1231 Encounter for screening mammogram for malignant neoplasm of breast: Secondary | ICD-10-CM

## 2016-08-07 ENCOUNTER — Other Ambulatory Visit (HOSPITAL_COMMUNITY)
Admission: RE | Admit: 2016-08-07 | Discharge: 2016-08-07 | Disposition: A | Discharge status: Home / Self Care | Payer: 59 | Source: Ambulatory Visit | Attending: Internal Medicine | Admitting: Internal Medicine

## 2016-08-07 ENCOUNTER — Encounter: Payer: Self-pay | Admitting: Internal Medicine

## 2016-08-07 ENCOUNTER — Ambulatory Visit (INDEPENDENT_AMBULATORY_CARE_PROVIDER_SITE_OTHER): Payer: 59 | Admitting: Internal Medicine

## 2016-08-07 VITALS — BP 110/64 | HR 82 | Temp 97.9°F | Ht 59.0 in | Wt 161.0 lb

## 2016-08-07 DIAGNOSIS — L989 Disorder of the skin and subcutaneous tissue, unspecified: Secondary | ICD-10-CM

## 2016-08-07 DIAGNOSIS — Z124 Encounter for screening for malignant neoplasm of cervix: Secondary | ICD-10-CM | POA: Diagnosis not present

## 2016-08-07 DIAGNOSIS — R87619 Unspecified abnormal cytological findings in specimens from cervix uteri: Secondary | ICD-10-CM

## 2016-08-07 DIAGNOSIS — E669 Obesity, unspecified: Secondary | ICD-10-CM

## 2016-08-07 DIAGNOSIS — Z1151 Encounter for screening for human papillomavirus (HPV): Secondary | ICD-10-CM | POA: Diagnosis not present

## 2016-08-07 DIAGNOSIS — Z1159 Encounter for screening for other viral diseases: Secondary | ICD-10-CM | POA: Diagnosis not present

## 2016-08-07 DIAGNOSIS — R519 Headache, unspecified: Secondary | ICD-10-CM

## 2016-08-07 DIAGNOSIS — Z01419 Encounter for gynecological examination (general) (routine) without abnormal findings: Secondary | ICD-10-CM | POA: Diagnosis present

## 2016-08-07 DIAGNOSIS — E7800 Pure hypercholesterolemia, unspecified: Secondary | ICD-10-CM

## 2016-08-07 DIAGNOSIS — R51 Headache: Secondary | ICD-10-CM

## 2016-08-07 LAB — BASIC METABOLIC PANEL
BUN: 15 mg/dL (ref 6–23)
CALCIUM: 9 mg/dL (ref 8.4–10.5)
CO2: 29 meq/L (ref 19–32)
Chloride: 107 mEq/L (ref 96–112)
Creatinine, Ser: 0.57 mg/dL (ref 0.40–1.20)
GFR: 142.82 mL/min (ref >60.00)
GLUCOSE: 84 mg/dL (ref 70–99)
POTASSIUM: 4 meq/L (ref 3.5–5.1)
Sodium: 141 mEq/L (ref 135–145)

## 2016-08-07 LAB — LIPID PANEL
CHOLESTEROL: 151 mg/dL (ref 0–200)
HDL: 55.8 mg/dL (ref >39.00)
LDL Cholesterol: 85 mg/dL (ref 0–99)
NonHDL: 95.38
Total CHOL/HDL Ratio: 3
Triglycerides: 52 mg/dL (ref 0.0–149.0)
VLDL: 10.4 mg/dL (ref 0.0–40.0)

## 2016-08-07 LAB — HEPATIC FUNCTION PANEL
ALT: 22 U/L (ref 0–35)
AST: 15 U/L (ref 0–37)
Albumin: 3.9 g/dL (ref 3.5–5.2)
Alkaline Phosphatase: 55 U/L (ref 39–117)
BILIRUBIN DIRECT: 0 mg/dL (ref 0.0–0.3)
BILIRUBIN TOTAL: 0.3 mg/dL (ref 0.2–1.2)
Total Protein: 7.4 g/dL (ref 6.0–8.3)

## 2016-08-07 MED ORDER — ROSUVASTATIN CALCIUM 10 MG PO TABS: 10.0000 mg | ORAL_TABLET | Freq: Every day | ORAL | 11 refills | Status: DC

## 2016-08-07 MED ORDER — MUPIROCIN 2 % EX OINT: TOPICAL_OINTMENT | CUTANEOUS | 0 refills | Status: DC

## 2016-08-07 NOTE — Assessment & Plan Note (Addendum)
Sees Dr Ouida Sills - gyn.  Most recent pap smears have been normal.  Last documented pap smear 07/07/14 negative.  States gets these yearly.  Missed her last appt with gyn.  Wanted pap here. Pap today.

## 2016-08-07 NOTE — Progress Notes (Signed)
Patient ID: Alison Rodriguez, female   DOB: Oct 30, 1961, 55 y.o.   MRN: [id number]   Subjective:    Patient ID: Alison Rodriguez, female    DOB: Mar 13, 1961, 55 y.o.   MRN: [id number]  HPI  Patient here for a scheduled follow up.  States she is doing well.  Minimal headache this am.  Relates to possibly sinus issues.  Has not had issues with headaches.  No chest pain.  No sob.  No acid reflux.  No  abdominal pain or cramping.  Bowels stable.  Wants to get her pap here.  Missed her gyn appt.  No vaginal problems.  Has persistent skin lesion on forehead.  Wants refill medication.     Past Medical History:  Diagnosis Date  . Blood in stool    H/O  . Hyperlipidemia   . Hypertension   . Thyroid disease    Past Surgical History:  Procedure Laterality Date  . UTERINE FIBROID EMBOLIZATION     Family History  Problem Relation Age of Onset  . Diabetes Mother   . Diabetes Maternal Grandmother   . Arthritis Maternal Grandfather   . Diabetes Maternal Grandfather   . Breast cancer      second cousins/aunt   Social History   Social History  . Marital status: Married    Spouse name: N/A  . Number of children: N/A  . Years of education: N/A   Social History Main Topics  . Smoking status: Never Smoker  . Smokeless tobacco: Never Used  . Alcohol use No  . Drug use: No  . Sexual activity: Not Asked   Other Topics Concern  . None   Social History Narrative  . None    Outpatient Encounter Prescriptions as of 08/07/2016  Medication Sig  . mupirocin ointment (BACTROBAN) 2 % Apply to affected area bid  . rosuvastatin (CRESTOR) 10 MG tablet TAKE ONE TABLET BY MOUTH ONE TIME DAILY   No facility-administered encounter medications on file as of 08/07/2016.     Review of Systems  Constitutional: Negative for appetite change and unexpected weight change.  HENT: Negative for congestion and sinus pressure.   Respiratory: Negative for cough, chest tightness and shortness of breath.     Cardiovascular: Negative for chest pain, palpitations and leg swelling.  Gastrointestinal: Negative for abdominal pain, diarrhea, nausea and vomiting.  Genitourinary: Negative for difficulty urinating and dysuria.  Musculoskeletal: Negative for back pain and joint swelling.  Skin: Negative for color change and rash.       Raised lesion - forehead.  No evidence of infection.    Neurological: Negative for dizziness and light-headedness.       Minimal headache today.   Psychiatric/Behavioral: Negative for agitation and dysphoric mood.       Objective:     Blood pressure rechecked by me:  114/78  Physical Exam  Constitutional: She appears well-developed and well-nourished. No distress.  HENT:  Nose: Nose normal.  Mouth/Throat: Oropharynx is clear and moist.  Neck: Neck supple. No thyromegaly present.  Cardiovascular: Normal rate and regular rhythm.   Pulmonary/Chest: Breath sounds normal. No respiratory distress. She has no wheezes.  Abdominal: Soft. Bowel sounds are normal. There is no tenderness.  Musculoskeletal: She exhibits no edema or tenderness.  Lymphadenopathy:    She has no cervical adenopathy.  Skin: No rash noted. No erythema.  Raised lesion - forehead.    Psychiatric: She has a normal mood and affect. Her behavior is normal.    BP 110/64  Pulse 82   Temp 97.9 F (36.6 C) (Oral)   Ht 4\' 11"  (1.499 m)   Wt 161 lb (73 kg)   SpO2 95%   BMI 32.52 kg/m  Wt Readings from Last 3 Encounters:  08/07/16 161 lb (73 kg)  01/31/16 152 lb 8 oz (69.2 kg)  08/02/15 161 lb 8 oz (73.3 kg)     Lab Results  Component Value Date   WBC 5.8 01/31/2016   HGB 13.9 01/31/2016   HCT 42.6 01/31/2016   PLT 256.0 01/31/2016   GLUCOSE 74 01/31/2016   CHOL 140 01/31/2016   TRIG 59.0 01/31/2016   HDL 49.90 01/31/2016   LDLCALC 78 01/31/2016   ALT 13 01/31/2016   AST 16 01/31/2016   NA 142 01/31/2016   K 3.8 01/31/2016   CL 108 01/31/2016   CREATININE 0.55 01/31/2016   BUN  13 01/31/2016   CO2 24 01/31/2016   TSH 1.43 01/31/2016    Mm Screening Breast Tomo Bilateral  Result Date: 07/14/2016 CLINICAL DATA:  Screening. EXAM: 2D DIGITAL SCREENING BILATERAL MAMMOGRAM WITH CAD AND ADJUNCT TOMO COMPARISON:  Previous exam(s). ACR Breast Density Category c: The breast tissue is heterogeneously dense, which may obscure small masses. FINDINGS: There are no findings suspicious for malignancy. Images were processed with CAD. IMPRESSION: No mammographic evidence of malignancy. A result letter of this screening mammogram will be mailed directly to the patient. RECOMMENDATION: Screening mammogram in one year. (Code:SM-B-01Y) BI-RADS CATEGORY  1: Negative. Electronically Signed   By: Marin Olp M.D.   On: 07/14/2016 13:00       Assessment & Plan:   Problem List Items Addressed This Visit    Abnormal Pap smear of cervix    Sees Dr Ouida Sills - gyn.  Most recent pap smears have been normal.  Last documented pap smear 07/07/14 negative.  States gets these yearly.  Missed her last appt with gyn.  Wanted pap here. Pap today.       Headache    Not a significant issue for her.  Follow.       Hypercholesterolemia    On crestor.  Low cholesterol diet and exercise.  Has lost weight.  Follow lipid panel and liver function tests.        Relevant Orders   Lipid panel   Hepatic function panel   Basic metabolic panel   Obesity (BMI 30.0-34.9)    Diet and exercise.  Follow.        Other Visit Diagnoses    Screening for cervical cancer    -  Primary   Relevant Orders   Cytology - PAP   Need for hepatitis C screening test       Relevant Orders   Hepatitis C antibody   Skin lesion       on her forehead.  bactroban as directed.  follow.  call if does not resolve.     Cervical cancer screening       pap smear today.        Einar Pheasant, MD

## 2016-08-07 NOTE — Progress Notes (Signed)
Pre visit review using our clinic review tool, if applicable. No additional management support is needed unless otherwise documented below in the visit note. 

## 2016-08-07 NOTE — Assessment & Plan Note (Signed)
On crestor.  Low cholesterol diet and exercise.  Has lost weight.  Follow lipid panel and liver function tests.

## 2016-08-07 NOTE — Assessment & Plan Note (Signed)
Diet and exercise.  Follow.  

## 2016-08-07 NOTE — Assessment & Plan Note (Signed)
Not a significant issue for her.  Follow.   

## 2016-08-07 NOTE — Addendum Note (Signed)
Addended by: Nanci Pina on: 08/07/2016 08:44 AM   Modules accepted: Orders

## 2016-08-07 NOTE — Addendum Note (Signed)
Addended by: Juanda Chance on: 08/07/2016 08:42 AM   Modules accepted: Orders

## 2016-08-08 LAB — HEPATITIS C ANTIBODY: HCV Ab: NEGATIVE

## 2016-08-08 LAB — CYTOLOGY - PAP

## 2016-08-10 ENCOUNTER — Telehealth: Payer: Self-pay | Admitting: *Deleted

## 2016-08-10 NOTE — Telephone Encounter (Signed)
Spoke with the patient and reviewed labs.

## 2016-08-10 NOTE — Telephone Encounter (Signed)
Patient requested lab results  Pt [id number]

## 2016-11-27 DIAGNOSIS — J40 Bronchitis, not specified as acute or chronic: Secondary | ICD-10-CM | POA: Diagnosis not present

## 2017-02-05 ENCOUNTER — Encounter: Payer: Self-pay | Admitting: Internal Medicine

## 2017-02-05 ENCOUNTER — Ambulatory Visit (INDEPENDENT_AMBULATORY_CARE_PROVIDER_SITE_OTHER): Payer: 59 | Admitting: Internal Medicine

## 2017-02-05 VITALS — BP 120/82 | HR 74 | Temp 97.6°F | Ht 59.0 in | Wt 160.4 lb

## 2017-02-05 DIAGNOSIS — E7800 Pure hypercholesterolemia, unspecified: Secondary | ICD-10-CM | POA: Diagnosis not present

## 2017-02-05 DIAGNOSIS — E66811 Obesity, class 1: Secondary | ICD-10-CM

## 2017-02-05 DIAGNOSIS — R87619 Unspecified abnormal cytological findings in specimens from cervix uteri: Secondary | ICD-10-CM

## 2017-02-05 DIAGNOSIS — E669 Obesity, unspecified: Secondary | ICD-10-CM

## 2017-02-05 DIAGNOSIS — M79644 Pain in right finger(s): Secondary | ICD-10-CM

## 2017-02-05 DIAGNOSIS — L989 Disorder of the skin and subcutaneous tissue, unspecified: Secondary | ICD-10-CM

## 2017-02-05 LAB — CBC WITH DIFFERENTIAL/PLATELET
Basophils Absolute: 0.1 10*3/uL (ref 0.0–0.1)
Basophils Relative: 1 % (ref 0.0–3.0)
EOS ABS: 0.2 10*3/uL (ref 0.0–0.7)
EOS PCT: 3.5 % (ref 0.0–5.0)
HCT: 42.2 % (ref 36.0–46.0)
HEMOGLOBIN: 13.8 g/dL (ref 12.0–15.0)
LYMPHS ABS: 1.9 10*3/uL (ref 0.7–4.0)
Lymphocytes Relative: 33.7 % (ref 12.0–46.0)
MCHC: 32.8 g/dL (ref 30.0–36.0)
MCV: 88.2 fl (ref 78.0–100.0)
MONO ABS: 0.4 10*3/uL (ref 0.1–1.0)
Monocytes Relative: 7.6 % (ref 3.0–12.0)
NEUTROS PCT: 54.2 % (ref 43.0–77.0)
Neutro Abs: 3.1 10*3/uL (ref 1.4–7.7)
Platelets: 292 10*3/uL (ref 150.0–400.0)
RBC: 4.78 Mil/uL (ref 3.87–5.11)
RDW: 14 % (ref 11.5–15.5)
WBC: 5.7 10*3/uL (ref 4.0–10.5)

## 2017-02-05 LAB — COMPREHENSIVE METABOLIC PANEL
ALT: 15 U/L (ref 0–35)
AST: 14 U/L (ref 0–37)
Albumin: 4 g/dL (ref 3.5–5.2)
Alkaline Phosphatase: 54 U/L (ref 39–117)
BUN: 11 mg/dL (ref 6–23)
CO2: 27 meq/L (ref 19–32)
Calcium: 9.4 mg/dL (ref 8.4–10.5)
Chloride: 107 mEq/L (ref 96–112)
Creatinine, Ser: 0.58 mg/dL (ref 0.40–1.20)
GFR: 139.71 mL/min (ref >60.00)
Glucose, Bld: 102 mg/dL — ABNORMAL HIGH (ref 70–99)
POTASSIUM: 4 meq/L (ref 3.5–5.1)
SODIUM: 140 meq/L (ref 135–145)
TOTAL PROTEIN: 7.1 g/dL (ref 6.0–8.3)
Total Bilirubin: 0.3 mg/dL (ref 0.2–1.2)

## 2017-02-05 LAB — LIPID PANEL
CHOLESTEROL: 149 mg/dL (ref 0–200)
HDL: 53.8 mg/dL (ref >39.00)
LDL CALC: 86 mg/dL (ref 0–99)
NONHDL: 95.65
Total CHOL/HDL Ratio: 3
Triglycerides: 48 mg/dL (ref 0.0–149.0)
VLDL: 9.6 mg/dL (ref 0.0–40.0)

## 2017-02-05 LAB — TSH: TSH: 2.27 u[IU]/mL (ref 0.35–4.50)

## 2017-02-05 MED ORDER — ROSUVASTATIN CALCIUM 10 MG PO TABS: 10.0000 mg | ORAL_TABLET | Freq: Every day | ORAL | 6 refills | Status: DC

## 2017-02-05 NOTE — Assessment & Plan Note (Signed)
On crestor.  Low cholesterol diet and exercise.  Follow lipid panel and liver function tests.   

## 2017-02-05 NOTE — Progress Notes (Signed)
Pre-visit discussion using our clinic review tool. No additional management support is needed unless otherwise documented below in the visit note.  

## 2017-02-05 NOTE — Progress Notes (Signed)
Patient ID: Alison Rodriguez, female   DOB: 08-27-1961, 56 y.o.   MRN: [id number]   Subjective:    Patient ID: Alison Rodriguez, female    DOB: 1961/08/16, 56 y.o.   MRN: [id number]  HPI  Patient here for a scheduled follow up.  She was having issues with a persistent lesion on her forehead.  Tried bactroban.  Did not help.  Discussed given persistent, referral to dermatology for evaluation.  She tries to stay active.  Recently purchased a spinning bike. Plans to get in more of a routine with this.  No chest pain. No sob.  No acid reflux.  No abdominal pain.  Bowels moving.  Does report some pain in the base of her right thumb.  Minimal discomfort.  Discussed conservative treatment.  Discussed diet and exercise.  Information given.    Past Medical History:  Diagnosis Date  . Blood in stool    H/O  . Hyperlipidemia   . Hypertension   . Thyroid disease    Past Surgical History:  Procedure Laterality Date  . UTERINE FIBROID EMBOLIZATION     Family History  Problem Relation Age of Onset  . Diabetes Mother   . Diabetes Maternal Grandmother   . Arthritis Maternal Grandfather   . Diabetes Maternal Grandfather   . Breast cancer      second cousins/aunt   Social History   Social History  . Marital status: Married    Spouse name: N/A  . Number of children: N/A  . Years of education: N/A   Social History Main Topics  . Smoking status: Never Smoker  . Smokeless tobacco: Never Used  . Alcohol use No  . Drug use: No  . Sexual activity: Not Asked   Other Topics Concern  . None   Social History Narrative  . None    Outpatient Encounter Prescriptions as of 02/05/2017  Medication Sig  . rosuvastatin (CRESTOR) 10 MG tablet Take 1 tablet (10 mg total) by mouth daily.  . [DISCONTINUED] rosuvastatin (CRESTOR) 10 MG tablet Take 1 tablet (10 mg total) by mouth daily.  . [DISCONTINUED] mupirocin ointment (BACTROBAN) 2 % Apply to affected area bid   No facility-administered encounter  medications on file as of 02/05/2017.     Review of Systems  Constitutional: Negative for appetite change and unexpected weight change.  HENT: Negative for congestion and sinus pressure.   Respiratory: Negative for cough, chest tightness and shortness of breath.   Cardiovascular: Negative for chest pain, palpitations and leg swelling.  Gastrointestinal: Negative for abdominal pain, diarrhea, nausea and vomiting.  Genitourinary: Negative for difficulty urinating and dysuria.  Musculoskeletal: Negative for back pain and joint swelling.       Pain in base of right thumb as outlined.    Skin: Negative for rash.       Persistent lesion on face.    Neurological: Negative for dizziness, light-headedness and headaches.  Psychiatric/Behavioral: Negative for agitation and dysphoric mood.       Objective:    Physical Exam  Constitutional: She appears well-developed and well-nourished. No distress.  HENT:  Nose: Nose normal.  Mouth/Throat: Oropharynx is clear and moist.  Neck: Neck supple. No thyromegaly present.  Cardiovascular: Normal rate and regular rhythm.   Pulmonary/Chest: Breath sounds normal. No respiratory distress. She has no wheezes.  Abdominal: Soft. Bowel sounds are normal. There is no tenderness.  Musculoskeletal: She exhibits no edema or tenderness.  Pain - base of right thumb - minimal - notices more with  resistance against flexion.    Lymphadenopathy:    She has no cervical adenopathy.  Skin: No rash noted. No erythema.    BP 120/82 (BP Location: Left Arm, Patient Position: Sitting, Cuff Size: Normal)   Pulse 74   Temp 97.6 F (36.4 C) (Oral)   Ht 4\' 11"  (1.499 m)   Wt 160 lb 6.4 oz (72.8 kg)   LMP 06/22/2015 (Approximate)   SpO2 97%   BMI 32.40 kg/m  Wt Readings from Last 3 Encounters:  02/05/17 160 lb 6.4 oz (72.8 kg)  08/07/16 161 lb (73 kg)  01/31/16 152 lb 8 oz (69.2 kg)     Lab Results  Component Value Date   WBC 5.7 02/05/2017   HGB 13.8 02/05/2017    HCT 42.2 02/05/2017   PLT 292.0 02/05/2017   GLUCOSE 102 (H) 02/05/2017   CHOL 149 02/05/2017   TRIG 48.0 02/05/2017   HDL 53.80 02/05/2017   LDLCALC 86 02/05/2017   ALT 15 02/05/2017   AST 14 02/05/2017   NA 140 02/05/2017   K 4.0 02/05/2017   CL 107 02/05/2017   CREATININE 0.58 02/05/2017   BUN 11 02/05/2017   CO2 27 02/05/2017   TSH 2.27 02/05/2017       Assessment & Plan:   Problem List Items Addressed This Visit    Abnormal Pap smear of cervix    PAP 08/07/16 - negative with negative HPV.        Hypercholesterolemia    On crestor.  Low cholesterol diet and exercise.  Follow lipid panel and liver function tests.        Relevant Medications   rosuvastatin (CRESTOR) 10 MG tablet   Other Relevant Orders   CBC with Differential/Platelet (Completed)   Comprehensive metabolic panel (Completed)   TSH (Completed)   Lipid panel (Completed)   Obesity (BMI 30.0-34.9)    Diet and exercise.         Other Visit Diagnoses    Skin lesion    -  Primary   Persistent skin lesion on face/forehead.  refer to dermatology.     Relevant Orders   Ambulatory referral to Dermatology   Pain of right thumb       thumb spica.  follow.         Einar Pheasant, MD

## 2017-02-06 ENCOUNTER — Telehealth: Payer: Self-pay | Admitting: Internal Medicine

## 2017-02-06 NOTE — Telephone Encounter (Signed)
Pt called back returning your call. Thank you!  Call pt @ [phone]

## 2017-02-06 NOTE — Telephone Encounter (Signed)
[name] advised of lab results.

## 2017-02-09 ENCOUNTER — Encounter: Payer: Self-pay | Admitting: Internal Medicine

## 2017-02-11 ENCOUNTER — Encounter: Payer: Self-pay | Admitting: Internal Medicine

## 2017-02-11 NOTE — Assessment & Plan Note (Signed)
PAP 08/07/16 - negative with negative HPV.

## 2017-02-11 NOTE — Assessment & Plan Note (Signed)
Diet and exercise.   

## 2017-03-08 ENCOUNTER — Other Ambulatory Visit: Payer: Self-pay | Admitting: Internal Medicine

## 2017-06-08 DIAGNOSIS — L281 Prurigo nodularis: Secondary | ICD-10-CM | POA: Diagnosis not present

## 2017-06-08 DIAGNOSIS — L819 Disorder of pigmentation, unspecified: Secondary | ICD-10-CM | POA: Diagnosis not present

## 2017-06-08 DIAGNOSIS — L718 Other rosacea: Secondary | ICD-10-CM | POA: Diagnosis not present

## 2017-06-25 ENCOUNTER — Other Ambulatory Visit: Payer: Self-pay | Admitting: Internal Medicine

## 2017-06-25 DIAGNOSIS — Z1231 Encounter for screening mammogram for malignant neoplasm of breast: Secondary | ICD-10-CM

## 2017-07-13 ENCOUNTER — Encounter (INDEPENDENT_AMBULATORY_CARE_PROVIDER_SITE_OTHER): Payer: Self-pay

## 2017-07-13 ENCOUNTER — Ambulatory Visit
Admission: RE | Admit: 2017-07-13 | Discharge: 2017-07-13 | Disposition: A | Discharge status: Home / Self Care | Payer: 59 | Source: Ambulatory Visit | Attending: Internal Medicine | Admitting: Internal Medicine

## 2017-07-13 DIAGNOSIS — Z1231 Encounter for screening mammogram for malignant neoplasm of breast: Secondary | ICD-10-CM

## 2017-08-10 ENCOUNTER — Encounter: Payer: Self-pay | Admitting: Internal Medicine

## 2017-08-27 ENCOUNTER — Encounter: Payer: 59 | Admitting: Internal Medicine

## 2017-09-11 ENCOUNTER — Other Ambulatory Visit (HOSPITAL_COMMUNITY)
Admission: RE | Admit: 2017-09-11 | Discharge: 2017-09-11 | Disposition: A | Discharge status: Home / Self Care | Payer: 59 | Source: Ambulatory Visit | Attending: Internal Medicine | Admitting: Internal Medicine

## 2017-09-11 ENCOUNTER — Ambulatory Visit (INDEPENDENT_AMBULATORY_CARE_PROVIDER_SITE_OTHER): Payer: 59 | Admitting: Internal Medicine

## 2017-09-11 ENCOUNTER — Encounter: Payer: Self-pay | Admitting: Internal Medicine

## 2017-09-11 VITALS — BP 118/74 | HR 90 | Temp 98.3°F | Resp 14 | Ht 59.0 in | Wt 166.6 lb

## 2017-09-11 DIAGNOSIS — Z23 Encounter for immunization: Secondary | ICD-10-CM

## 2017-09-11 DIAGNOSIS — E7800 Pure hypercholesterolemia, unspecified: Secondary | ICD-10-CM

## 2017-09-11 DIAGNOSIS — Z6833 Body mass index (BMI) 33.0-33.9, adult: Secondary | ICD-10-CM | POA: Diagnosis not present

## 2017-09-11 DIAGNOSIS — Z124 Encounter for screening for malignant neoplasm of cervix: Secondary | ICD-10-CM | POA: Insufficient documentation

## 2017-09-11 DIAGNOSIS — Z0000 Encounter for general adult medical examination without abnormal findings: Secondary | ICD-10-CM | POA: Diagnosis not present

## 2017-09-11 LAB — LIPID PANEL
CHOLESTEROL: 156 mg/dL (ref 0–200)
HDL: 50.7 mg/dL (ref >39.00)
LDL Cholesterol: 82 mg/dL (ref 0–99)
NonHDL: 105.19
TRIGLYCERIDES: 117 mg/dL (ref 0.0–149.0)
Total CHOL/HDL Ratio: 3
VLDL: 23.4 mg/dL (ref 0.0–40.0)

## 2017-09-11 LAB — COMPREHENSIVE METABOLIC PANEL
ALBUMIN: 4.1 g/dL (ref 3.5–5.2)
ALK PHOS: 53 U/L (ref 39–117)
ALT: 16 U/L (ref 0–35)
AST: 13 U/L (ref 0–37)
BILIRUBIN TOTAL: 0.3 mg/dL (ref 0.2–1.2)
BUN: 14 mg/dL (ref 6–23)
CALCIUM: 9.4 mg/dL (ref 8.4–10.5)
CO2: 30 meq/L (ref 19–32)
Chloride: 106 mEq/L (ref 96–112)
Creatinine, Ser: 0.62 mg/dL (ref 0.40–1.20)
GFR: 129.07 mL/min (ref >60.00)
Glucose, Bld: 55 mg/dL — ABNORMAL LOW (ref 70–99)
Potassium: 3.5 mEq/L (ref 3.5–5.1)
Sodium: 141 mEq/L (ref 135–145)
Total Protein: 7.4 g/dL (ref 6.0–8.3)

## 2017-09-11 LAB — TSH: TSH: 1.13 u[IU]/mL (ref 0.35–4.50)

## 2017-09-11 NOTE — Progress Notes (Signed)
Subjective:    Patient ID: Alison Rodriguez, female    DOB: Jul 03, 1961, 56 y.o.   MRN: [id number]  HPI  Patient here for her physical exam.  She reports she is doing relatively well.  Her husband was involved - MVA.  Having to help care for him.  Has been doing some pulling, etc.  Noticed some back discomfort yesterday.  Felt like pulled/tight.  No pain radiating down legs.  No numbness or tingling.  Pain better today and not reproducible.  No chest pain.  No sob.  No acid reflux.  No abdominal pain.  Bowels moving.     Past Medical History:  Diagnosis Date  . Blood in stool    H/O  . Hyperlipidemia   . Hypertension   . Thyroid disease    Past Surgical History:  Procedure Laterality Date  . UTERINE FIBROID EMBOLIZATION     Family History  Problem Relation Age of Onset  . Diabetes Mother   . Diabetes Maternal Grandmother   . Arthritis Maternal Grandfather   . Diabetes Maternal Grandfather   . Breast cancer Unknown        second cousins/aunt   Social History   Social History  . Marital status: Married    Spouse name: N/A  . Number of children: N/A  . Years of education: N/A   Social History Main Topics  . Smoking status: Never Smoker  . Smokeless tobacco: Never Used  . Alcohol use No  . Drug use: No  . Sexual activity: Not Asked   Other Topics Concern  . None   Social History Narrative  . None    Outpatient Encounter Prescriptions as of 09/11/2017  Medication Sig  . rosuvastatin (CRESTOR) 10 MG tablet Take 1 tablet (10 mg total) by mouth daily.  . [DISCONTINUED] rosuvastatin (CRESTOR) 10 MG tablet TAKE ONE TABLET BY MOUTH ONE TIME DAILY   No facility-administered encounter medications on file as of 09/11/2017.     Review of Systems  Constitutional: Negative for appetite change and unexpected weight change.  HENT: Negative for congestion and sinus pressure.   Eyes: Negative for pain and visual disturbance.  Respiratory: Negative for cough, chest  tightness and shortness of breath.   Cardiovascular: Negative for chest pain, palpitations and leg swelling.  Gastrointestinal: Negative for abdominal pain, diarrhea, nausea and vomiting.  Genitourinary: Negative for difficulty urinating and dysuria.  Musculoskeletal: Negative for back pain and joint swelling.       Back pain as outlined.  Better today.   Skin: Negative for color change and rash.  Neurological: Negative for dizziness, light-headedness and headaches.  Hematological: Negative for adenopathy. Does not bruise/bleed easily.  Psychiatric/Behavioral: Negative for agitation and dysphoric mood.       Objective:    Physical Exam  Constitutional: She is oriented to person, place, and time. She appears well-developed and well-nourished. No distress.  HENT:  Nose: Nose normal.  Mouth/Throat: Oropharynx is clear and moist.  Eyes: Right eye exhibits no discharge. Left eye exhibits no discharge. No scleral icterus.  Neck: Neck supple. No thyromegaly present.  Cardiovascular: Normal rate and regular rhythm.   Pulmonary/Chest: Breath sounds normal. No accessory muscle usage. No tachypnea. No respiratory distress. She has no decreased breath sounds. She has no wheezes. She has no rhonchi. Right breast exhibits no inverted nipple, no mass, no nipple discharge and no tenderness (no axillary adenopathy). Left breast exhibits no inverted nipple, no mass, no nipple discharge and no tenderness (no axilarry  adenopathy).  Abdominal: Soft. Bowel sounds are normal. There is no tenderness.  Genitourinary:  Genitourinary Comments: Normal external genitalia.  Vaginal vault without lesions.  Cervix identified.  Pap smear performed.  Could not appreciate any adnexal masses or tenderness.    Musculoskeletal: She exhibits no edema or tenderness.  No pain to palpation - back.  No pain with SLR.  Motor strength - equal bilaterally.  No deficit noted.  Lymphadenopathy:    She has no cervical adenopathy.    Neurological: She is alert and oriented to person, place, and time.  Skin: Skin is warm. No rash noted. No erythema.  Psychiatric: She has a normal mood and affect. Her behavior is normal.    BP 118/74 (BP Location: Left Arm, Patient Position: Sitting, Cuff Size: Normal)   Pulse 90   Temp 98.3 F (36.8 C) (Oral)   Resp 14   Ht 4\' 11"  (1.499 m)   Wt 166 lb 9.6 oz (75.6 kg)   LMP 06/22/2015 (Approximate)   SpO2 96%   BMI 33.65 kg/m  Wt Readings from Last 3 Encounters:  09/11/17 166 lb 9.6 oz (75.6 kg)  02/05/17 160 lb 6.4 oz (72.8 kg)  08/07/16 161 lb (73 kg)     Lab Results  Component Value Date   WBC 5.7 02/05/2017   HGB 13.8 02/05/2017   HCT 42.2 02/05/2017   PLT 292.0 02/05/2017   GLUCOSE 55 (L) 09/11/2017   CHOL 156 09/11/2017   TRIG 117.0 09/11/2017   HDL 50.70 09/11/2017   LDLCALC 82 09/11/2017   ALT 16 09/11/2017   AST 13 09/11/2017   NA 141 09/11/2017   K 3.5 09/11/2017   CL 106 09/11/2017   CREATININE 0.62 09/11/2017   BUN 14 09/11/2017   CO2 30 09/11/2017   TSH 1.13 09/11/2017    Mm Screening Breast Tomo Bilateral  Result Date: 07/13/2017 CLINICAL DATA:  Screening. EXAM: 2D DIGITAL SCREENING BILATERAL MAMMOGRAM WITH CAD AND ADJUNCT TOMO COMPARISON:  Previous exam(s). ACR Breast Density Category c: The breast tissue is heterogeneously dense, which may obscure small masses. FINDINGS: There are no findings suspicious for malignancy. Images were processed with CAD. IMPRESSION: No mammographic evidence of malignancy. A result letter of this screening mammogram will be mailed directly to the patient. RECOMMENDATION: Screening mammogram in one year. (Code:SM-B-01Y) BI-RADS CATEGORY  1: Negative. Electronically Signed   By: Ammie Ferrier M.D.   On: 07/13/2017 14:22       Assessment & Plan:   Problem List Items Addressed This Visit    BMI 33.0-33.9,adult    Discussed diet and exercise.  Follow.       Health care maintenance    Physical today 09/11/17.   PAP 09/11/17.  Mammogram 07/13/17 -  Birads I.  Colonoscopy 08/11/09 - internal hemorrhoids otherwise normal.        Hypercholesterolemia - Primary    On crestor.  Low cholesterol diet and exercise.  Follow lipid panel and liver function tests.        Relevant Orders   Lipid panel (Completed)   Comprehensive metabolic panel (Completed)   TSH (Completed)    Other Visit Diagnoses    Need for immunization against influenza       Relevant Orders   Flu Vaccine QUAD 36+ mos IM (Completed)   Cervical cancer screening       Relevant Orders   Cytology - PAP (Completed)       Einar Pheasant, MD

## 2017-09-13 LAB — CYTOLOGY - PAP
DIAGNOSIS: NEGATIVE
HPV: NOT DETECTED

## 2017-09-14 ENCOUNTER — Encounter: Payer: Self-pay | Admitting: Internal Medicine

## 2017-09-14 NOTE — Assessment & Plan Note (Signed)
Physical today 09/11/17.  PAP 09/11/17.  Mammogram 07/13/17 -  Birads I.  Colonoscopy 08/11/09 - internal hemorrhoids otherwise normal.

## 2017-09-14 NOTE — Assessment & Plan Note (Signed)
Discussed diet and exercise.  Follow.  

## 2017-09-14 NOTE — Assessment & Plan Note (Signed)
On crestor.  Low cholesterol diet and exercise.  Follow lipid panel and liver function tests.   

## 2018-03-09 ENCOUNTER — Other Ambulatory Visit: Payer: Self-pay | Admitting: Internal Medicine

## 2018-03-13 ENCOUNTER — Encounter: Payer: Self-pay | Admitting: *Deleted

## 2018-03-13 ENCOUNTER — Ambulatory Visit: Payer: 59 | Admitting: Internal Medicine

## 2018-03-13 ENCOUNTER — Encounter: Payer: Self-pay | Admitting: Internal Medicine

## 2018-03-13 VITALS — BP 118/74 | HR 74 | Temp 98.3°F | Resp 18 | Wt 163.2 lb

## 2018-03-13 DIAGNOSIS — K6289 Other specified diseases of anus and rectum: Secondary | ICD-10-CM | POA: Diagnosis not present

## 2018-03-13 DIAGNOSIS — K649 Unspecified hemorrhoids: Secondary | ICD-10-CM

## 2018-03-13 DIAGNOSIS — E7800 Pure hypercholesterolemia, unspecified: Secondary | ICD-10-CM

## 2018-03-13 DIAGNOSIS — Z6832 Body mass index (BMI) 32.0-32.9, adult: Secondary | ICD-10-CM | POA: Diagnosis not present

## 2018-03-13 LAB — BASIC METABOLIC PANEL
BUN: 8 mg/dL (ref 6–23)
CO2: 27 mEq/L (ref 19–32)
Calcium: 9.2 mg/dL (ref 8.4–10.5)
Chloride: 106 mEq/L (ref 96–112)
Creatinine, Ser: 0.56 mg/dL (ref 0.40–1.20)
GFR: 144.88 mL/min (ref >60.00)
Glucose, Bld: 98 mg/dL (ref 70–99)
Potassium: 3.9 mEq/L (ref 3.5–5.1)
SODIUM: 139 meq/L (ref 135–145)

## 2018-03-13 LAB — HEPATIC FUNCTION PANEL
ALK PHOS: 58 U/L (ref 39–117)
ALT: 22 U/L (ref 0–35)
AST: 17 U/L (ref 0–37)
Albumin: 3.9 g/dL (ref 3.5–5.2)
BILIRUBIN TOTAL: 0.3 mg/dL (ref 0.2–1.2)
Bilirubin, Direct: 0.1 mg/dL (ref 0.0–0.3)
Total Protein: 7.2 g/dL (ref 6.0–8.3)

## 2018-03-13 LAB — CBC WITH DIFFERENTIAL/PLATELET
BASOS ABS: 0.1 10*3/uL (ref 0.0–0.1)
Basophils Relative: 1.3 % (ref 0.0–3.0)
Eosinophils Absolute: 0.2 10*3/uL (ref 0.0–0.7)
Eosinophils Relative: 3.8 % (ref 0.0–5.0)
HCT: 43.5 % (ref 36.0–46.0)
Hemoglobin: 14.3 g/dL (ref 12.0–15.0)
LYMPHS ABS: 1.6 10*3/uL (ref 0.7–4.0)
Lymphocytes Relative: 30.2 % (ref 12.0–46.0)
MCHC: 32.8 g/dL (ref 30.0–36.0)
MCV: 88.6 fl (ref 78.0–100.0)
MONO ABS: 0.4 10*3/uL (ref 0.1–1.0)
MONOS PCT: 7.3 % (ref 3.0–12.0)
NEUTROS PCT: 57.4 % (ref 43.0–77.0)
Neutro Abs: 2.9 10*3/uL (ref 1.4–7.7)
Platelets: 291 10*3/uL (ref 150.0–400.0)
RBC: 4.91 Mil/uL (ref 3.87–5.11)
RDW: 14 % (ref 11.5–15.5)
WBC: 5.1 10*3/uL (ref 4.0–10.5)

## 2018-03-13 LAB — LIPID PANEL
Cholesterol: 142 mg/dL (ref 0–200)
HDL: 51.1 mg/dL (ref >39.00)
LDL CALC: 78 mg/dL (ref 0–99)
NONHDL: 90.92
Total CHOL/HDL Ratio: 3
Triglycerides: 67 mg/dL (ref 0.0–149.0)
VLDL: 13.4 mg/dL (ref 0.0–40.0)

## 2018-03-13 MED ORDER — HYDROCORTISONE ACETATE 25 MG RE SUPP: 25.0000 mg | Freq: Two times a day (BID) | RECTAL | 0 refills | Status: DC

## 2018-03-13 NOTE — Progress Notes (Signed)
Patient ID: Alison Rodriguez, female   DOB: 07-Jul-1961, 57 y.o.   MRN: [id number]   Subjective:    Patient ID: Alison Rodriguez, female    DOB: [birth year], 12 y.o.   MRN: [id number]  HPI  Patient here for a scheduled follow up.  She reports she is doing well.  She does have concern over possible "boil or hemorrhoid".  States has been present for a while, but appears to have worsened.  States will notice oozing and some discomfort.  No fever.  Bowels moving, but feels bowel movements aggravate.  Trying to stay active.  No chest pain.  No sob.  No acid reflux. No abdominal pain.  No blood.     Past Medical History:  Diagnosis Date  . Blood in stool    H/O  . Hyperlipidemia   . Hypertension   . Thyroid disease    Past Surgical History:  Procedure Laterality Date  . UTERINE FIBROID EMBOLIZATION     Family History  Problem Relation Age of Onset  . Diabetes Mother   . Diabetes Maternal Grandmother   . Arthritis Maternal Grandfather   . Diabetes Maternal Grandfather   . Breast cancer Unknown        second cousins/aunt   Social History   Socioeconomic History  . Marital status: Married    Spouse name: Not on file  . Number of children: Not on file  . Years of education: Not on file  . Highest education level: Not on file  Occupational History  . Not on file  Social Needs  . Financial resource strain: Not on file  . Food insecurity:    Worry: Not on file    Inability: Not on file  . Transportation needs:    Medical: Not on file    Non-medical: Not on file  Tobacco Use  . Smoking status: Never Smoker  . Smokeless tobacco: Never Used  Substance and Sexual Activity  . Alcohol use: No    Alcohol/week: 0.0 oz  . Drug use: No  . Sexual activity: Not on file  Lifestyle  . Physical activity:    Days per week: Not on file    Minutes per session: Not on file  . Stress: Not on file  Relationships  . Social connections:    Talks on phone: Not on file    Gets together: Not on  file    Attends religious service: Not on file    Active member of club or organization: Not on file    Attends meetings of clubs or organizations: Not on file    Relationship status: Not on file  Other Topics Concern  . Not on file  Social History Narrative  . Not on file    Outpatient Encounter Medications as of 03/13/2018  Medication Sig  . rosuvastatin (CRESTOR) 10 MG tablet TAKE ONE TABLET BY MOUTH ONE TIME DAILY  . hydrocortisone (ANUSOL-HC) 25 MG suppository Place 1 suppository (25 mg total) rectally 2 (two) times daily.  . [DISCONTINUED] rosuvastatin (CRESTOR) 10 MG tablet Take 1 tablet (10 mg total) by mouth daily.   No facility-administered encounter medications on file as of 03/13/2018.     Review of Systems  Constitutional: Negative for appetite change and unexpected weight change.  HENT: Negative for congestion and sinus pressure.   Respiratory: Negative for cough, chest tightness and shortness of breath.   Cardiovascular: Negative for chest pain, palpitations and leg swelling.  Gastrointestinal: Negative for abdominal pain, diarrhea, nausea and vomiting.  Persistent irritation and oozing - rectum and peri rectal area.    Genitourinary: Negative for difficulty urinating and dysuria.  Musculoskeletal: Negative for joint swelling and myalgias.  Skin: Negative for color change and rash.  Neurological: Negative for dizziness, light-headedness and headaches.  Psychiatric/Behavioral: Negative for agitation and dysphoric mood.       Objective:    Physical Exam  Constitutional: She appears well-developed and well-nourished. No distress.  HENT:  Nose: Nose normal.  Mouth/Throat: Oropharynx is clear and moist.  Neck: Neck supple. No thyromegaly present.  Cardiovascular: Normal rate and regular rhythm.  Pulmonary/Chest: Breath sounds normal. No respiratory distress. She has no wheezes.  Abdominal: Soft. Bowel sounds are normal. There is no tenderness.    Genitourinary:  Genitourinary Comments: No erythema or peri rectal irritation.  No hemorrhoid palpated or visualized.  Question of minimal irritation - with rectal exam.  No nodule.    Musculoskeletal: She exhibits no edema or tenderness.  Lymphadenopathy:    She has no cervical adenopathy.  Skin: No rash noted. No erythema.  Psychiatric: She has a normal mood and affect. Her behavior is normal.    BP 118/74 (BP Location: Left Arm, Patient Position: Sitting, Cuff Size: Normal)   Pulse 74   Temp 98.3 F (36.8 C) (Oral)   Resp 18   Wt 163 lb 3.2 oz (74 kg)   LMP 06/22/2015 (Approximate)   SpO2 97%   BMI 32.96 kg/m  Wt Readings from Last 3 Encounters:  03/13/18 163 lb 3.2 oz (74 kg)  09/11/17 166 lb 9.6 oz (75.6 kg)  02/05/17 160 lb 6.4 oz (72.8 kg)     Lab Results  Component Value Date   WBC 5.1 03/13/2018   HGB 14.3 03/13/2018   HCT 43.5 03/13/2018   PLT 291.0 03/13/2018   GLUCOSE 98 03/13/2018   CHOL 142 03/13/2018   TRIG 67.0 03/13/2018   HDL 51.10 03/13/2018   LDLCALC 78 03/13/2018   ALT 22 03/13/2018   AST 17 03/13/2018   NA 139 03/13/2018   K 3.9 03/13/2018   CL 106 03/13/2018   CREATININE 0.56 03/13/2018   BUN 8 03/13/2018   CO2 27 03/13/2018   TSH 1.13 09/11/2017       Assessment & Plan:   Problem List Items Addressed This Visit    BMI 32.0-32.9,adult    Diet and exercise.  Follow.       Hemorrhoids    Had hemorrhoids noted on colonoscopy.  Describes persistent irritation and oozing.  No significant abnormality noted on exam.  Trial of anusol HC suppositories.  She requested referral to surgery for evaluation.  Will have Dr Bary Castilla evaluate.        Hypercholesterolemia - Primary    On crestor.  Low cholesterol diet and exercise.  Follow lipid panel and liver function tests.        Relevant Orders   CBC with Differential/Platelet (Completed)   Hepatic function panel (Completed)   Lipid panel (Completed)   Basic metabolic panel (Completed)     Other Visit Diagnoses    Rectal irritation       Relevant Orders   Ambulatory referral to General Surgery       Einar Pheasant, MD

## 2018-03-16 ENCOUNTER — Encounter: Payer: Self-pay | Admitting: Internal Medicine

## 2018-03-16 NOTE — Assessment & Plan Note (Signed)
Had hemorrhoids noted on colonoscopy.  Describes persistent irritation and oozing.  No significant abnormality noted on exam.  Trial of anusol HC suppositories.  She requested referral to surgery for evaluation.  Will have Dr Bary Castilla evaluate.

## 2018-03-16 NOTE — Assessment & Plan Note (Signed)
Diet and exercise.  Follow.  

## 2018-03-16 NOTE — Assessment & Plan Note (Signed)
On crestor.  Low cholesterol diet and exercise.  Follow lipid panel and liver function tests.   

## 2018-03-26 ENCOUNTER — Encounter: Payer: Self-pay | Admitting: *Deleted

## 2018-04-09 ENCOUNTER — Ambulatory Visit: Payer: Self-pay | Admitting: General Surgery

## 2018-04-10 ENCOUNTER — Encounter: Payer: Self-pay | Admitting: *Deleted

## 2018-04-16 ENCOUNTER — Telehealth: Payer: Self-pay | Admitting: Internal Medicine

## 2018-04-16 NOTE — Telephone Encounter (Signed)
Copied from [place] [id number]. Topic: Quick Communication - See Telephone Encounter >> Apr 16, 2018  1:23 PM Conception Chancy, NT wrote: CRM for notification. See Telephone encounter for: 04/16/18.  Patient would like to know can something be called in for her yeast infection. She states she has used over the counter meds but it is not clearing up. Please advise.   [facility], [place] [address] [place] [address] Phone: [phone] Fax: [phone]

## 2018-04-17 NOTE — Telephone Encounter (Signed)
Left message for patient to call back. Need more info regarding symptoms. OK for P[facility] to speak with patient.

## 2018-04-19 MED ORDER — FLUCONAZOLE 150 MG PO TABS: ORAL_TABLET | ORAL | 0 refills | Status: DC

## 2018-04-19 NOTE — Telephone Encounter (Signed)
Spoke with patient. Patient is complaining of frequency, white colored discharge, and burning with urination since Tuesday. Patient stated that she had been working out and sweating without underwear on and then noticed these symptoms start. Patient has done the 3 day monistat and stated that symptoms became much better but did not resolve completely. Patient is requesting that [facility] Advised that she would most likely need an office visit instead of having a rx sent in given persistent symptoms. Offered patient an appt with Joycelyn Schmid today. Patient declined and stated she could not come in and would just like a prescription for something to clear up a yeast infection.

## 2018-04-19 NOTE — Telephone Encounter (Signed)
Patient returned call

## 2018-04-19 NOTE — Telephone Encounter (Signed)
Pt states that we should have called her cell #. I verified ph # [phone]. Pt states that she is having burning with urination. She used 3 day monistat but it didn't help. Pt states that she has been working out and sweating. She was not wearing underwear. She states she is also having frequent urination. Please call cell # to advise.   [facility], [place] [address] [place] [address] Phone: [phone] Fax: [phone]

## 2018-04-19 NOTE — Telephone Encounter (Signed)
Spoke to pt on phone.  Symptoms did improve with monistat, just did not resolve.  Would like diflucan.  Has taken and tolerated.  Diflucan sent in.  Informed that if she had persistent symptoms, would need to be evaluated.

## 2018-04-24 ENCOUNTER — Telehealth: Payer: Self-pay | Admitting: *Deleted

## 2018-04-24 NOTE — Telephone Encounter (Signed)
Copied from [place] [phone]. Topic: Appointment Scheduling - Scheduling Inquiry for Clinic >> Apr 24, 2018  1:05 PM Aurelio Brash B wrote: Reason for CRM: PT called to say  the medication  she had called in  for a yeast infection  is not working ,she is going to the restroom  every hour at night and is experiencing  burning,  she only wants to see Dr Nicki Reaper  but she has no openings.

## 2018-04-24 NOTE — Telephone Encounter (Signed)
scheduled

## 2018-04-24 NOTE — Telephone Encounter (Signed)
Called patient. She cannot come in tomorrow at 12:30. She is needing to come in the morning or afternoon because she works in [employer]. OK to work her in at 4:30 tomorrow?

## 2018-04-24 NOTE — Telephone Encounter (Signed)
Ok

## 2018-04-25 ENCOUNTER — Encounter: Payer: Self-pay | Admitting: Internal Medicine

## 2018-04-25 ENCOUNTER — Ambulatory Visit: Payer: 59 | Admitting: Internal Medicine

## 2018-04-25 ENCOUNTER — Other Ambulatory Visit (HOSPITAL_COMMUNITY)
Admission: RE | Admit: 2018-04-25 | Discharge: 2018-04-25 | Disposition: A | Discharge status: Home / Self Care | Payer: 59 | Source: Ambulatory Visit | Attending: Internal Medicine | Admitting: Internal Medicine

## 2018-04-25 VITALS — BP 102/78 | HR 82 | Temp 97.6°F | Resp 17 | Ht 59.0 in | Wt 155.5 lb

## 2018-04-25 DIAGNOSIS — N760 Acute vaginitis: Secondary | ICD-10-CM | POA: Insufficient documentation

## 2018-04-25 DIAGNOSIS — R300 Dysuria: Secondary | ICD-10-CM | POA: Diagnosis not present

## 2018-04-25 LAB — POCT URINALYSIS DIP (MANUAL ENTRY)
BILIRUBIN UA: NEGATIVE mg/dL
Bilirubin, UA: NEGATIVE
Glucose, UA: NEGATIVE mg/dL
Nitrite, UA: POSITIVE — AB
PH UA: 6.5 (ref 5.0–8.0)
Protein Ur, POC: 30 mg/dL — AB
SPEC GRAV UA: 1.02 (ref 1.010–1.025)
Urobilinogen, UA: 1 E.U./dL

## 2018-04-25 MED ORDER — NITROFURANTOIN MONOHYD MACRO 100 MG PO CAPS: 100.0000 mg | ORAL_CAPSULE | Freq: Two times a day (BID) | ORAL | 0 refills | Status: DC

## 2018-04-25 NOTE — Progress Notes (Signed)
Patient ID: Alison Rodriguez, female   DOB: 07-Apr-1961, 56 y.o.   MRN: [id number]   Subjective:    Patient ID: Alison Rodriguez, female    DOB: [birth year], 34 y.o.   MRN: [id number]  HPI  Patient here as a work in with concerns regarding a possible uti.  Reports that last week she noticed that her urine was cloudy.  Also noticed some burning with urination.  Some increased frequency.  She called initially and was concerned was yeast infection.  Described burning when urine touched the tissue.  Took diflucan.  Felt helped some.  Has noticed the above symptoms.  No abdominal pain.  Eating and drinking.  No vomiting.  No diarrhea.  Is sexually active with her husband.     Past Medical History:  Diagnosis Date  . Blood in stool    H/O  . Hyperlipidemia   . Hypertension   . Thyroid disease    Past Surgical History:  Procedure Laterality Date  . UTERINE FIBROID EMBOLIZATION     Family History  Problem Relation Age of Onset  . Diabetes Mother   . Diabetes Maternal Grandmother   . Arthritis Maternal Grandfather   . Diabetes Maternal Grandfather   . Breast cancer Unknown        second cousins/aunt   Social History   Socioeconomic History  . Marital status: Married    Spouse name: Not on file  . Number of children: Not on file  . Years of education: Not on file  . Highest education level: Not on file  Occupational History  . Not on file  Social Needs  . Financial resource strain: Not on file  . Food insecurity:    Worry: Not on file    Inability: Not on file  . Transportation needs:    Medical: Not on file    Non-medical: Not on file  Tobacco Use  . Smoking status: Never Smoker  . Smokeless tobacco: Never Used  Substance and Sexual Activity  . Alcohol use: No    Alcohol/week: 0.0 oz  . Drug use: No  . Sexual activity: Not on file  Lifestyle  . Physical activity:    Days per week: Not on file    Minutes per session: Not on file  . Stress: Not on file  Relationships    . Social connections:    Talks on phone: Not on file    Gets together: Not on file    Attends religious service: Not on file    Active member of club or organization: Not on file    Attends meetings of clubs or organizations: Not on file    Relationship status: Not on file  Other Topics Concern  . Not on file  Social History Narrative  . Not on file    Outpatient Encounter Medications as of 04/25/2018  Medication Sig  . hydrocortisone (ANUSOL-HC) 25 MG suppository Place 1 suppository (25 mg total) rectally 2 (two) times daily.  . rosuvastatin (CRESTOR) 10 MG tablet TAKE ONE TABLET BY MOUTH ONE TIME DAILY  . nitrofurantoin, macrocrystal-monohydrate, (MACROBID) 100 MG capsule Take 1 capsule (100 mg total) by mouth 2 (two) times daily.  . [DISCONTINUED] fluconazole (DIFLUCAN) 150 MG tablet Take one tablet x 1 and then repeat x 1 in 72 hours if symptoms have not resolved.   No facility-administered encounter medications on file as of 04/25/2018.     Review of Systems  Constitutional: Negative for appetite change and fever.  Gastrointestinal: Negative for  abdominal pain, diarrhea, nausea and vomiting.  Genitourinary: Positive for dysuria and frequency.  Musculoskeletal: Negative for myalgias.  Skin: Negative for color change and rash.  Psychiatric/Behavioral: Negative for agitation and dysphoric mood.       Objective:    Physical Exam  Constitutional: She appears well-developed and well-nourished. No distress.  Cardiovascular: Normal rate and regular rhythm.  Pulmonary/Chest: Breath sounds normal. No respiratory distress. She has no wheezes.  Abdominal: Soft. Bowel sounds are normal. There is no tenderness.  Genitourinary:  Genitourinary Comments: Normal external genitalia.  Vaginal vault without lesions.  Some discharge present.  Send off KOH/wet prep.  Could not appreciate any adnexal masses or tenderness.    Musculoskeletal: She exhibits no edema or tenderness.  Skin: No rash  noted. No erythema.  Psychiatric: She has a normal mood and affect. Her behavior is normal.    BP 102/78   Pulse 82   Temp 97.6 F (36.4 C) (Oral)   Resp 17   Ht 4\' 11"  (1.499 m)   Wt 155 lb 8 oz (70.5 kg)   LMP 06/22/2015 (Approximate)   SpO2 95%   BMI 31.41 kg/m  Wt Readings from Last 3 Encounters:  04/25/18 155 lb 8 oz (70.5 kg)  03/13/18 163 lb 3.2 oz (74 kg)  09/11/17 166 lb 9.6 oz (75.6 kg)     Lab Results  Component Value Date   WBC 5.1 03/13/2018   HGB 14.3 03/13/2018   HCT 43.5 03/13/2018   PLT 291.0 03/13/2018   GLUCOSE 98 03/13/2018   CHOL 142 03/13/2018   TRIG 67.0 03/13/2018   HDL 51.10 03/13/2018   LDLCALC 78 03/13/2018   ALT 22 03/13/2018   AST 17 03/13/2018   NA 139 03/13/2018   K 3.9 03/13/2018   CL 106 03/13/2018   CREATININE 0.56 03/13/2018   BUN 8 03/13/2018   CO2 27 03/13/2018   TSH 1.13 09/11/2017       Assessment & Plan:   Problem List Items Addressed This Visit    Dysuria - Primary   Relevant Orders   POCT urinalysis dipstick (Completed)   Urine Culture (Completed)   Urine Microscopic Only (Completed)    Other Visit Diagnoses    Acute vaginitis       Discharge present.  Await results of KOH/wet prep.     Relevant Orders   Cervicovaginal ancillary only (Completed)       Einar Pheasant, MD

## 2018-04-26 ENCOUNTER — Other Ambulatory Visit: Payer: Self-pay | Admitting: Internal Medicine

## 2018-04-26 DIAGNOSIS — I83813 Varicose veins of bilateral lower extremities with pain: Secondary | ICD-10-CM

## 2018-04-26 LAB — URINE CULTURE
MICRO NUMBER: 90681801
SPECIMEN QUALITY:: ADEQUATE

## 2018-04-26 LAB — URINALYSIS, MICROSCOPIC ONLY

## 2018-04-26 LAB — CERVICOVAGINAL ANCILLARY ONLY: Wet Prep (BD Affirm): POSITIVE — AB

## 2018-04-26 MED ORDER — METRONIDAZOLE 500 MG PO TABS: ORAL_TABLET | ORAL | 0 refills | Status: DC

## 2018-04-26 NOTE — Progress Notes (Signed)
Order placed for flagyl 2g x 1 and order placed for vascular surgery referral.

## 2018-04-29 ENCOUNTER — Other Ambulatory Visit: Payer: Self-pay | Admitting: Internal Medicine

## 2018-04-29 DIAGNOSIS — R319 Hematuria, unspecified: Secondary | ICD-10-CM

## 2018-04-29 NOTE — Progress Notes (Signed)
Order placed for f/u urinalysis 

## 2018-05-02 ENCOUNTER — Ambulatory Visit: Payer: 59 | Admitting: Internal Medicine

## 2018-05-02 ENCOUNTER — Encounter: Payer: Self-pay | Admitting: Internal Medicine

## 2018-05-02 VITALS — BP 118/78 | HR 70 | Temp 97.9°F | Wt 157.0 lb

## 2018-05-02 DIAGNOSIS — B029 Zoster without complications: Secondary | ICD-10-CM | POA: Diagnosis not present

## 2018-05-02 MED ORDER — VALACYCLOVIR HCL 1 G PO TABS: 1000.0000 mg | ORAL_TABLET | Freq: Three times a day (TID) | ORAL | 0 refills | Status: DC

## 2018-05-02 MED ORDER — GABAPENTIN 100 MG PO CAPS: 100.0000 mg | ORAL_CAPSULE | Freq: Three times a day (TID) | ORAL | 0 refills | Status: DC

## 2018-05-02 NOTE — Progress Notes (Signed)
Subjective:    Patient ID: Alison Rodriguez, female    DOB: 10-24-1961, 57 y.o.   MRN: [id number]  HPI  Pt presents to the clinic today with c/o a rash. She noticed this 1 week ago. The rash started on her buttocks and has now spread to the left groin. She reports the rash is achy but can be sharp and stabbing. She has tried Cortisone cream without relief. She denies changes in soaps, lotions or detergents. No one in her home has a similar rash.   Review of Systems      Past Medical History:  Diagnosis Date  . Blood in stool    H/O  . Hyperlipidemia   . Hypertension   . Thyroid disease     Current Outpatient Medications  Medication Sig Dispense Refill  . hydrocortisone (ANUSOL-HC) 25 MG suppository Place 1 suppository (25 mg total) rectally 2 (two) times daily. 14 suppository 0  . metroNIDAZOLE (FLAGYL) 500 MG tablet Take 4 tablets x 1 4 tablet 0  . nitrofurantoin, macrocrystal-monohydrate, (MACROBID) 100 MG capsule Take 1 capsule (100 mg total) by mouth 2 (two) times daily. 10 capsule 0  . rosuvastatin (CRESTOR) 10 MG tablet TAKE ONE TABLET BY MOUTH ONE TIME DAILY 30 tablet 11   No current facility-administered medications for this visit.     No Known Allergies  Family History  Problem Relation Age of Onset  . Diabetes Mother   . Diabetes Maternal Grandmother   . Arthritis Maternal Grandfather   . Diabetes Maternal Grandfather   . Breast cancer Unknown        second cousins/aunt    Social History   Socioeconomic History  . Marital status: Married    Spouse name: Not on file  . Number of children: Not on file  . Years of education: Not on file  . Highest education level: Not on file  Occupational History  . Not on file  Social Needs  . Financial resource strain: Not on file  . Food insecurity:    Worry: Not on file    Inability: Not on file  . Transportation needs:    Medical: Not on file    Non-medical: Not on file  Tobacco Use  . Smoking status: Never  Smoker  . Smokeless tobacco: Never Used  Substance and Sexual Activity  . Alcohol use: No    Alcohol/week: 0.0 oz  . Drug use: No  . Sexual activity: Not on file  Lifestyle  . Physical activity:    Days per week: Not on file    Minutes per session: Not on file  . Stress: Not on file  Relationships  . Social connections:    Talks on phone: Not on file    Gets together: Not on file    Attends religious service: Not on file    Active member of club or organization: Not on file    Attends meetings of clubs or organizations: Not on file    Relationship status: Not on file  . Intimate partner violence:    Fear of current or ex partner: Not on file    Emotionally abused: Not on file    Physically abused: Not on file    Forced sexual activity: Not on file  Other Topics Concern  . Not on file  Social History Narrative  . Not on file     Constitutional: Denies fever, malaise, fatigue, headache or abrupt weight changes.  Skin: Pt reports rash. Denies ulcercations.  No other specific complaints in a complete review of systems (except as listed in HPI above).  Objective:   Physical Exam   BP 118/78   Pulse 70   Temp 97.9 F (36.6 C) (Oral)   Wt 157 lb (71.2 kg)   LMP 06/22/2015 (Approximate)   SpO2 98%   BMI 31.71 kg/m  Wt Readings from Last 3 Encounters:  05/02/18 157 lb (71.2 kg)  04/25/18 155 lb 8 oz (70.5 kg)  03/13/18 163 lb 3.2 oz (74 kg)    General: Appears her stated age, well developed, well nourished in NAD. Skin: Grouped vesicular lesions noted on left buttock, extending down to left posterior thigh.  BMET    Component Value Date/Time   NA 139 03/13/2018 0842   NA 139 12/08/2014 1713   K 3.9 03/13/2018 0842   K 4.1 12/08/2014 1713   CL 106 03/13/2018 0842   CL 104 12/08/2014 1713   CO2 27 03/13/2018 0842   CO2 28 12/08/2014 1713   GLUCOSE 98 03/13/2018 0842   GLUCOSE 81 12/08/2014 1713   BUN 8 03/13/2018 0842   BUN 11 12/08/2014 1713    CREATININE 0.56 03/13/2018 0842   CREATININE 0.63 12/08/2014 1713   CALCIUM 9.2 03/13/2018 0842   CALCIUM 8.8 12/08/2014 1713   GFRNONAA >60 12/08/2014 1713   GFRAA >60 12/08/2014 1713    Lipid Panel     Component Value Date/Time   CHOL 142 03/13/2018 0842   TRIG 67.0 03/13/2018 0842   HDL 51.10 03/13/2018 0842   CHOLHDL 3 03/13/2018 0842   VLDL 13.4 03/13/2018 0842   LDLCALC 78 03/13/2018 0842    CBC    Component Value Date/Time   WBC 5.1 03/13/2018 0842   RBC 4.91 03/13/2018 0842   HGB 14.3 03/13/2018 0842   HGB 15.1 12/08/2014 1713   HCT 43.5 03/13/2018 0842   HCT 47.4 (H) 12/08/2014 1713   PLT 291.0 03/13/2018 0842   PLT 289 12/08/2014 1713   MCV 88.6 03/13/2018 0842   MCV 90 12/08/2014 1713   MCH 28.5 12/08/2014 1713   MCHC 32.8 03/13/2018 0842   RDW 14.0 03/13/2018 0842   RDW 13.3 12/08/2014 1713   LYMPHSABS 1.6 03/13/2018 0842   MONOABS 0.4 03/13/2018 0842   EOSABS 0.2 03/13/2018 0842   BASOSABS 0.1 03/13/2018 0842    Hgb A1C No results found for: HGBA1C         Assessment & Plan:   Shingles:  eRx for Valtrex 1 gm TID x 7 days eRx for Neurontin 100 mg TID x 7 days- sedation caution given Advised to stay away from pregnant women, immunocompromised individuals Work note provided  Return precautions discussed Webb Silversmith, NP

## 2018-05-02 NOTE — Patient Instructions (Signed)

## 2018-05-09 ENCOUNTER — Other Ambulatory Visit: Payer: Self-pay | Admitting: Internal Medicine

## 2018-05-09 MED ORDER — VALACYCLOVIR HCL 1 G PO TABS: 1000.0000 mg | ORAL_TABLET | Freq: Three times a day (TID) | ORAL | 0 refills | Status: DC

## 2018-05-09 MED ORDER — GABAPENTIN 100 MG PO CAPS: 100.0000 mg | ORAL_CAPSULE | Freq: Three times a day (TID) | ORAL | 0 refills | Status: DC

## 2018-05-09 NOTE — Telephone Encounter (Signed)
LOV  05/02/18 Dr. Nicki Reaper

## 2018-05-09 NOTE — Telephone Encounter (Addendum)
Patient saw NP at [facility] on 05/02/18 for shingles she says she is better but has not cleared, shingles going down left leg they are starting to crust over patient is requesting refill of valtrex and gabapentin ok to fill? Became disconnected from patient, she has out break on her left leg and it runs up to vaginal area the area around her vagina the labia is swollen and painful she requested to be revaluated PCP not in office scheduled with Dr. Derrel Nip at 11 on 05/10/18

## 2018-05-09 NOTE — Telephone Encounter (Signed)
Copied from [place] [id number]. Topic: Quick Communication - Rx Refill/Question >> May 09, 2018  7:23 AM Robina Ade, Helene Kelp D wrote: Medication: gabapentin (NEURONTIN) 100 MG capsule,rosuvastatin (CRESTOR) 10 MG tablet  Has the patient contacted their pharmacy? No (Agent: If no, request that the patient contact the pharmacy for the refill.) (Agent: If yes, when and what did the pharmacy advise?)  Preferred Pharmacy (with phone number or street name): [facility] [place]: Please be advised that RX refills may take up to 3 business days. We ask that you follow-up with your pharmacy.

## 2018-05-09 NOTE — Telephone Encounter (Signed)
The medications have been refilled

## 2018-05-09 NOTE — Telephone Encounter (Signed)
Patient called back to state that she did not need the rosuvastatin (Crestor) 10 MG tablet.  Instead she meant that she needed the valACYclovir (VALTREX) 1000 MG tablet medication.

## 2018-05-10 ENCOUNTER — Ambulatory Visit: Payer: 59 | Admitting: Internal Medicine

## 2018-05-10 ENCOUNTER — Encounter: Payer: Self-pay | Admitting: Internal Medicine

## 2018-05-10 VITALS — BP 122/80 | HR 81 | Temp 98.1°F | Resp 14 | Ht 59.0 in | Wt 157.6 lb

## 2018-05-10 DIAGNOSIS — B029 Zoster without complications: Secondary | ICD-10-CM | POA: Diagnosis not present

## 2018-05-10 DIAGNOSIS — R350 Frequency of micturition: Secondary | ICD-10-CM | POA: Insufficient documentation

## 2018-05-10 DIAGNOSIS — R300 Dysuria: Secondary | ICD-10-CM | POA: Diagnosis not present

## 2018-05-10 DIAGNOSIS — Z8619 Personal history of other infectious and parasitic diseases: Secondary | ICD-10-CM

## 2018-05-10 DIAGNOSIS — A599 Trichomoniasis, unspecified: Secondary | ICD-10-CM | POA: Insufficient documentation

## 2018-05-10 LAB — POCT URINALYSIS DIPSTICK
Bilirubin, UA: NEGATIVE
Glucose, UA: NEGATIVE
KETONES UA: NEGATIVE
Leukocytes, UA: NEGATIVE
NITRITE UA: NEGATIVE
PH UA: 5.5 (ref 5.0–8.0)
PROTEIN UA: POSITIVE — AB
UROBILINOGEN UA: 0.2 U/dL

## 2018-05-10 LAB — URINALYSIS, MICROSCOPIC ONLY

## 2018-05-10 MED ORDER — DOXYCYCLINE HYCLATE 100 MG PO TABS: 100.0000 mg | ORAL_TABLET | Freq: Two times a day (BID) | ORAL | 0 refills | Status: DC

## 2018-05-10 MED ORDER — NITROFURANTOIN MONOHYD MACRO 100 MG PO CAPS: 100.0000 mg | ORAL_CAPSULE | Freq: Two times a day (BID) | ORAL | 0 refills | Status: DC

## 2018-05-10 NOTE — Progress Notes (Signed)
Subjective:  Patient ID: Alison Rodriguez, female    DOB: 01-Apr-1961  Age: 57 y.o. MRN: [id number]  CC: The primary encounter diagnosis was Urinary frequency. Diagnoses of History of trichomoniasis, Dysuria, Herpes zoster without complication, and Trichomonas infection were also pertinent to this visit.  HPI Alison Rodriguez presents for follow up on shingles affecting left buttock and thigh.  Treated on June 13 with Valtrex and gabapentin  Symptoms and blisters have all but resolved,  Pain has  improved but she developed pain on pai  and tingling on the left side of vulva along with swelling and requested repeat evaluation .   appt made.  Since then her vulva has felt better and the swelling has resolved.  She does report an episode of burning with urination this mornign and has had increased frequency   Patient requesting that her urine be rechecked for trichomonas since it was positive earlier in the month and she was treated.  She and husband were both positive,  Neither has been sexually active outside the marriage.  She had her husband get tested for everything else and all STD testing was negative.   Outpatient Medications Prior to Visit  Medication Sig Dispense Refill  . gabapentin (NEURONTIN) 100 MG capsule Take 1 capsule (100 mg total) by mouth 3 (three) times daily. 21 capsule 0  . hydrocortisone (ANUSOL-HC) 25 MG suppository Place 1 suppository (25 mg total) rectally 2 (two) times daily. 14 suppository 0  . rosuvastatin (CRESTOR) 10 MG tablet TAKE ONE TABLET BY MOUTH ONE TIME DAILY 30 tablet 11  . valACYclovir (VALTREX) 1000 MG tablet Take 1 tablet (1,000 mg total) by mouth 3 (three) times daily. 21 tablet 0   No facility-administered medications prior to visit.     Review of Systems;  Patient denies headache, fevers, malaise, unintentional weight loss, skin rash, eye pain, sinus congestion and sinus pain, sore throat, dysphagia,  hemoptysis , cough, dyspnea, wheezing, chest pain,  palpitations, orthopnea, edema, abdominal pain, nausea, melena, diarrhea, constipation, flank pain, dysuria, hematuria, urinary  Frequency, nocturia, numbness, tingling, seizures,  Focal weakness, Loss of consciousness,  Tremor, insomnia, depression, anxiety, and suicidal ideation.      Objective:  BP 122/80 (BP Location: Left Arm, Patient Position: Sitting, Cuff Size: Normal)   Pulse 81   Temp 98.1 F (36.7 C) (Oral)   Resp 14   Ht 4\' 11"  (1.499 m)   Wt 157 lb 9.6 oz (71.5 kg)   LMP 06/22/2015 (Approximate)   SpO2 100%   BMI 31.83 kg/m   BP Readings from Last 3 Encounters:  05/10/18 122/80  05/02/18 118/78  04/25/18 102/78    Wt Readings from Last 3 Encounters:  05/10/18 157 lb 9.6 oz (71.5 kg)  05/02/18 157 lb (71.2 kg)  04/25/18 155 lb 8 oz (70.5 kg)    General appearance: alert, cooperative and appears stated age Lungs: clear to auscultation bilaterally Heart: regular rate and rhythm, S1, S2 normal, no murmur, click, rub or gallop Abdomen: soft, non-tender; bowel sounds normal; no masses,  no organomegaly Pulses: 2+ and symmetric Skin: resolving grouped vesicles on left buttock and posterior thigh.  No lesions on vulva or vaginal wall.  Lymph nodes: Cervical, supraclavicular, and axillary nodes normal.  No results found for: HGBA1C  Lab Results  Component Value Date   CREATININE 0.56 03/13/2018   CREATININE 0.62 09/11/2017   CREATININE 0.58 02/05/2017    Lab Results  Component Value Date   WBC 5.1 03/13/2018   HGB  14.3 03/13/2018   HCT 43.5 03/13/2018   PLT 291.0 03/13/2018   GLUCOSE 98 03/13/2018   CHOL 142 03/13/2018   TRIG 67.0 03/13/2018   HDL 51.10 03/13/2018   LDLCALC 78 03/13/2018   ALT 22 03/13/2018   AST 17 03/13/2018   NA 139 03/13/2018   K 3.9 03/13/2018   CL 106 03/13/2018   CREATININE 0.56 03/13/2018   BUN 8 03/13/2018   CO2 27 03/13/2018   TSH 1.13 09/11/2017    No results found.  Assessment & Plan:   Problem List Items  Addressed This Visit    Urinary frequency - Primary    With dysuria today.  POCT not conclusive  .  Micr and culture ordered.  macrobid rx given for start if symptoms persist given weekend status.       Relevant Orders   POCT Urinalysis Dipstick (Completed)   Urine Microscopic Only   Urine Culture   Trichomonas infection    diangosed from urine probe June 6 and treated.  husband treated as well. Repeat testing today  Requested.  Husband tested negative for all other STDS. Patient encouraged to return for HIV and other STD testing       Herpes zoster    Affecting left buttock and posteriori thigh.  Vulva has no lesions but felt "tingly"  Over the last 2 days.  reperat valtrex and gabapentin refilled      Dysuria    POCT UA noted blood and protein.  Micro,  Culture and Ancillary probe for  Trichomonas repeating today.  Patient given rx fpr Macrobid for use if dysuria worsens over the weeken       Other Visit Diagnoses    History of trichomoniasis       Relevant Orders   Cervicovaginal ancillary only      I am having Alison Rodriguez start on doxycycline. I am also having her maintain her rosuvastatin, hydrocortisone, gabapentin, valACYclovir, and nitrofurantoin (macrocrystal-monohydrate).  Meds ordered this encounter  Medications  . doxycycline (VIBRA-TABS) 100 MG tablet    Sig: Take 1 tablet (100 mg total) by mouth 2 (two) times daily.    Dispense:  14 tablet    Refill:  0  . nitrofurantoin, macrocrystal-monohydrate, (MACROBID) 100 MG capsule    Sig: Take 1 capsule (100 mg total) by mouth 2 (two) times daily.    Dispense:  10 capsule    Refill:  0    There are no discontinued medications.  Follow-up: No follow-ups on file.   Crecencio Mc, MD

## 2018-05-10 NOTE — Assessment & Plan Note (Signed)
diangosed from urine probe June 6 and treated.  husband treated as well. Repeat testing today  Requested.  Husband tested negative for all other STDS. Patient encouraged to return for HIV and other STD testing

## 2018-05-10 NOTE — Assessment & Plan Note (Signed)
POCT UA noted blood and protein.  Micro,  Culture and Ancillary probe for  Trichomonas repeating today.  Patient given rx fpr Macrobid for use if dysuria worsens over the weeken

## 2018-05-10 NOTE — Assessment & Plan Note (Signed)
Affecting left buttock and posteriori thigh.  Vulva has no lesions but felt "tingly"  Over the last 2 days.  reperat valtrex and gabapentin refilled

## 2018-05-10 NOTE — Assessment & Plan Note (Signed)
With dysuria today.  POCT not conclusive  .  Micr and culture ordered.  macrobid rx given for start if symptoms persist given weekend status.

## 2018-05-10 NOTE — Patient Instructions (Signed)
Start the doxycycline  if you develop a skin infection or a boil.  Start the macrobid if you develop symptoms of a UTI   Ok to continue gabapentin  If needed for pain   Ok to  [employer] any  New rx for valtrex unless you get new blisters    Only pregnant women,  People with no history of chicken pox,  And people who are immunocompromomised (AIDS,  Chemotherapy for Cancer) should avoid people with active cases of shingles (blisters present)

## 2018-05-11 LAB — URINE CULTURE
MICRO NUMBER:: 90745150
SPECIMEN QUALITY: ADEQUATE

## 2018-05-13 ENCOUNTER — Encounter (INDEPENDENT_AMBULATORY_CARE_PROVIDER_SITE_OTHER): Payer: 59 | Admitting: Vascular Surgery

## 2018-06-24 ENCOUNTER — Other Ambulatory Visit: Payer: Self-pay | Admitting: Internal Medicine

## 2018-06-24 DIAGNOSIS — Z1231 Encounter for screening mammogram for malignant neoplasm of breast: Secondary | ICD-10-CM

## 2018-07-18 ENCOUNTER — Ambulatory Visit
Admission: RE | Admit: 2018-07-18 | Discharge: 2018-07-18 | Disposition: A | Discharge status: Home / Self Care | Payer: 59 | Source: Ambulatory Visit | Attending: Internal Medicine | Admitting: Internal Medicine

## 2018-07-18 DIAGNOSIS — Z1231 Encounter for screening mammogram for malignant neoplasm of breast: Secondary | ICD-10-CM

## 2018-09-12 ENCOUNTER — Encounter: Payer: Self-pay | Admitting: Internal Medicine

## 2018-09-12 ENCOUNTER — Ambulatory Visit (INDEPENDENT_AMBULATORY_CARE_PROVIDER_SITE_OTHER): Payer: 59

## 2018-09-12 ENCOUNTER — Ambulatory Visit (INDEPENDENT_AMBULATORY_CARE_PROVIDER_SITE_OTHER): Payer: 59 | Admitting: Internal Medicine

## 2018-09-12 VITALS — BP 126/76 | HR 75 | Temp 98.1°F | Resp 18 | Wt 160.2 lb

## 2018-09-12 DIAGNOSIS — Z0000 Encounter for general adult medical examination without abnormal findings: Secondary | ICD-10-CM | POA: Diagnosis not present

## 2018-09-12 DIAGNOSIS — M19012 Primary osteoarthritis, left shoulder: Secondary | ICD-10-CM | POA: Diagnosis not present

## 2018-09-12 DIAGNOSIS — M25512 Pain in left shoulder: Secondary | ICD-10-CM

## 2018-09-12 DIAGNOSIS — E7800 Pure hypercholesterolemia, unspecified: Secondary | ICD-10-CM | POA: Diagnosis not present

## 2018-09-12 LAB — LIPID PANEL
CHOL/HDL RATIO: 3
CHOLESTEROL: 166 mg/dL (ref 0–200)
HDL: 51.3 mg/dL (ref >39.00)
LDL CALC: 102 mg/dL — AB (ref 0–99)
NonHDL: 114.92
Triglycerides: 65 mg/dL (ref 0.0–149.0)
VLDL: 13 mg/dL (ref 0.0–40.0)

## 2018-09-12 LAB — HEPATIC FUNCTION PANEL
ALT: 16 U/L (ref 0–35)
AST: 13 U/L (ref 0–37)
Albumin: 4.2 g/dL (ref 3.5–5.2)
Alkaline Phosphatase: 58 U/L (ref 39–117)
BILIRUBIN DIRECT: 0 mg/dL (ref 0.0–0.3)
BILIRUBIN TOTAL: 0.3 mg/dL (ref 0.2–1.2)
Total Protein: 7.7 g/dL (ref 6.0–8.3)

## 2018-09-12 LAB — BASIC METABOLIC PANEL
BUN: 12 mg/dL (ref 6–23)
CALCIUM: 9.5 mg/dL (ref 8.4–10.5)
CO2: 28 mEq/L (ref 19–32)
Chloride: 106 mEq/L (ref 96–112)
Creatinine, Ser: 0.67 mg/dL (ref 0.40–1.20)
GFR: 117.58 mL/min (ref >60.00)
Glucose, Bld: 93 mg/dL (ref 70–99)
Potassium: 4.2 mEq/L (ref 3.5–5.1)
SODIUM: 141 meq/L (ref 135–145)

## 2018-09-12 LAB — TSH: TSH: 1.43 u[IU]/mL (ref 0.35–4.50)

## 2018-09-12 MED ORDER — HYDROCORTISONE ACETATE 25 MG RE SUPP: 25.0000 mg | Freq: Two times a day (BID) | RECTAL | 0 refills | Status: DC

## 2018-09-12 NOTE — Assessment & Plan Note (Addendum)
Physical today 09/12/18.  PAP 09/11/17 - negative with negative HPV.  Mammogram 07/18/18 - Birads I.  Colonoscopy 08/11/09 - internal hemorrhoids otherwise normal.  Stool cards given.

## 2018-09-12 NOTE — Progress Notes (Signed)
Patient ID: Alison Rodriguez, female   DOB: 06-30-1961, 57 y.o.   MRN: [id number]   Subjective:    Patient ID: Alison Rodriguez, female    DOB: [birth year], 57 y.o.   MRN: [id number]  HPI  Patient here for her physical exam.  She reports she is doing well.  Feels good.  Does report over the last month - left shoulder pain.  Hurts worse when reaches posteriorly.  Limited rom.  No known injury.  No chest pain.  No sob.  No acid reflux.  No abdominal pain.  Bowels moving.      Past Medical History:  Diagnosis Date  . Blood in stool    H/O  . Hyperlipidemia   . Hypertension   . Thyroid disease    Past Surgical History:  Procedure Laterality Date  . UTERINE FIBROID EMBOLIZATION     Family History  Problem Relation Age of Onset  . Diabetes Mother   . Diabetes Maternal Grandmother   . Arthritis Maternal Grandfather   . Diabetes Maternal Grandfather   . Breast cancer Unknown        second cousins/aunt   Social History   Socioeconomic History  . Marital status: Married    Spouse name: Not on file  . Number of children: Not on file  . Years of education: Not on file  . Highest education level: Not on file  Occupational History  . Not on file  Social Needs  . Financial resource strain: Not on file  . Food insecurity:    Worry: Not on file    Inability: Not on file  . Transportation needs:    Medical: Not on file    Non-medical: Not on file  Tobacco Use  . Smoking status: Never Smoker  . Smokeless tobacco: Never Used  Substance and Sexual Activity  . Alcohol use: No    Alcohol/week: 0.0 standard drinks  . Drug use: No  . Sexual activity: Not on file  Lifestyle  . Physical activity:    Days per week: Not on file    Minutes per session: Not on file  . Stress: Not on file  Relationships  . Social connections:    Talks on phone: Not on file    Gets together: Not on file    Attends religious service: Not on file    Active member of club or organization: Not on file   Attends meetings of clubs or organizations: Not on file    Relationship status: Not on file  Other Topics Concern  . Not on file  Social History Narrative  . Not on file    Outpatient Encounter Medications as of 09/12/2018  Medication Sig  . hydrocortisone (ANUSOL-HC) 25 MG suppository Place 1 suppository (25 mg total) rectally 2 (two) times daily.  . rosuvastatin (CRESTOR) 10 MG tablet TAKE ONE TABLET BY MOUTH ONE TIME DAILY  . [DISCONTINUED] doxycycline (VIBRA-TABS) 100 MG tablet Take 1 tablet (100 mg total) by mouth 2 (two) times daily.  . [DISCONTINUED] gabapentin (NEURONTIN) 100 MG capsule Take 1 capsule (100 mg total) by mouth 3 (three) times daily.  . [DISCONTINUED] hydrocortisone (ANUSOL-HC) 25 MG suppository Place 1 suppository (25 mg total) rectally 2 (two) times daily.  . [DISCONTINUED] nitrofurantoin, macrocrystal-monohydrate, (MACROBID) 100 MG capsule Take 1 capsule (100 mg total) by mouth 2 (two) times daily.  . [DISCONTINUED] valACYclovir (VALTREX) 1000 MG tablet Take 1 tablet (1,000 mg total) by mouth 3 (three) times daily.   No facility-administered encounter medications  on file as of 09/12/2018.     Review of Systems  Constitutional: Negative for appetite change and unexpected weight change.  HENT: Negative for congestion and sinus pressure.   Eyes: Negative for pain and visual disturbance.  Respiratory: Negative for cough, chest tightness and shortness of breath.   Cardiovascular: Negative for chest pain, palpitations and leg swelling.  Gastrointestinal: Negative for abdominal pain, diarrhea, nausea and vomiting.  Genitourinary: Negative for difficulty urinating and dysuria.  Musculoskeletal: Negative for joint swelling and myalgias.       Left shoulder pain as outlined.    Skin: Negative for color change and rash.  Neurological: Negative for dizziness, light-headedness and headaches.  Hematological: Negative for adenopathy. Does not bruise/bleed easily.    Psychiatric/Behavioral: Negative for agitation and dysphoric mood.       Objective:    Physical Exam  Constitutional: She is oriented to person, place, and time. She appears well-developed and well-nourished. No distress.  HENT:  Nose: Nose normal.  Mouth/Throat: Oropharynx is clear and moist.  Eyes: Right eye exhibits no discharge. Left eye exhibits no discharge. No scleral icterus.  Neck: Neck supple. No thyromegaly present.  Cardiovascular: Normal rate and regular rhythm.  Pulmonary/Chest: Breath sounds normal. No accessory muscle usage. No tachypnea. No respiratory distress. She has no decreased breath sounds. She has no wheezes. She has no rhonchi. Right breast exhibits no inverted nipple, no mass, no nipple discharge and no tenderness (no axillary adenopathy). Left breast exhibits no inverted nipple, no mass, no nipple discharge and no tenderness (no axilarry adenopathy).  Abdominal: Soft. Bowel sounds are normal. There is no tenderness.  Musculoskeletal: She exhibits no edema or tenderness.  Lymphadenopathy:    She has no cervical adenopathy.  Neurological: She is alert and oriented to person, place, and time.  Skin: No rash noted. No erythema.  Psychiatric: She has a normal mood and affect. Her behavior is normal.    BP 126/76 (BP Location: Right Arm)   Pulse 75   Temp 98.1 F (36.7 C) (Oral)   Resp 18   Wt 160 lb 3.2 oz (72.7 kg)   LMP 06/22/2015 (Approximate)   SpO2 98%   BMI 32.36 kg/m  Wt Readings from Last 3 Encounters:  09/12/18 160 lb 3.2 oz (72.7 kg)  05/10/18 157 lb 9.6 oz (71.5 kg)  05/02/18 157 lb (71.2 kg)     Lab Results  Component Value Date   WBC 5.1 03/13/2018   HGB 14.3 03/13/2018   HCT 43.5 03/13/2018   PLT 291.0 03/13/2018   GLUCOSE 93 09/12/2018   CHOL 166 09/12/2018   TRIG 65.0 09/12/2018   HDL 51.30 09/12/2018   LDLCALC 102 (H) 09/12/2018   ALT 16 09/12/2018   AST 13 09/12/2018   NA 141 09/12/2018   K 4.2 09/12/2018   CL 106  09/12/2018   CREATININE 0.67 09/12/2018   BUN 12 09/12/2018   CO2 28 09/12/2018   TSH 1.43 09/12/2018    Mm 3d Screen Breast Bilateral  Result Date: 07/18/2018 CLINICAL DATA:  Screening. EXAM: DIGITAL SCREENING BILATERAL MAMMOGRAM WITH TOMO AND CAD COMPARISON:  Previous exam(s). ACR Breast Density Category c: The breast tissue is heterogeneously dense, which may obscure small masses. FINDINGS: There are no findings suspicious for malignancy. Images were processed with CAD. IMPRESSION: No mammographic evidence of malignancy. A result letter of this screening mammogram will be mailed directly to the patient. RECOMMENDATION: Screening mammogram in one year. (Code:SM-B-01Y) BI-RADS CATEGORY  1: Negative. Electronically Signed  By: Lajean Manes M.D.   On: 07/18/2018 12:03       Assessment & Plan:   Problem List Items Addressed This Visit    Health care maintenance    Physical today 09/12/18.  PAP 09/11/17 - negative with negative HPV.  Mammogram 07/18/18 - Birads I.  Colonoscopy 08/11/09 - internal hemorrhoids otherwise normal.  Stool cards given.        Hypercholesterolemia    On crestor.  Low cholesterol diet and exercise.  Follow lipid panel and liver function tests.        Relevant Orders   Hepatic function panel (Completed)   Lipid panel (Completed)   TSH (Completed)   Basic metabolic panel (Completed)    Other Visit Diagnoses    Routine general medical examination at a health care facility    -  Primary   Left shoulder pain, unspecified chronicity       Persistent pain.  Check xray.  May need therapy and/or referra to ortho.     Relevant Orders   DG Shoulder Left (Completed)       Einar Pheasant, MD

## 2018-09-12 NOTE — Assessment & Plan Note (Signed)
On crestor.  Low cholesterol diet and exercise.  Follow lipid panel and liver function tests.   

## 2018-09-13 ENCOUNTER — Encounter: Payer: Self-pay | Admitting: Internal Medicine

## 2018-09-14 ENCOUNTER — Other Ambulatory Visit: Payer: Self-pay | Admitting: Internal Medicine

## 2018-09-14 DIAGNOSIS — M25512 Pain in left shoulder: Secondary | ICD-10-CM

## 2018-09-14 NOTE — Progress Notes (Signed)
Order placed for referral to physical therapy.  

## 2018-09-15 ENCOUNTER — Encounter: Payer: Self-pay | Admitting: Internal Medicine

## 2018-10-08 ENCOUNTER — Ambulatory Visit: Payer: 59 | Attending: Internal Medicine | Admitting: Physical Therapy

## 2018-10-08 ENCOUNTER — Other Ambulatory Visit: Payer: Self-pay

## 2018-10-08 ENCOUNTER — Encounter: Payer: Self-pay | Admitting: Physical Therapy

## 2018-10-08 DIAGNOSIS — M25512 Pain in left shoulder: Secondary | ICD-10-CM | POA: Diagnosis not present

## 2018-10-08 DIAGNOSIS — G8929 Other chronic pain: Secondary | ICD-10-CM | POA: Diagnosis not present

## 2018-10-08 DIAGNOSIS — M542 Cervicalgia: Secondary | ICD-10-CM

## 2018-10-08 DIAGNOSIS — M6281 Muscle weakness (generalized): Secondary | ICD-10-CM | POA: Diagnosis present

## 2018-10-08 NOTE — Patient Instructions (Signed)
Home Exercise Program  Created by Rosita Kea, PT, DPT Nov 19th, 2019 View at "[email or web address]" using code: [id number]  Posture Correction with Lumbar Roll Sit in firm chair with hips as far back as possible and feet flat on floor. Place roll or rolled towel behind lower back and sit back so back is supported by roll. you can use a paper towel roll or search for "McKenzie style lumbar roll" on your favorite online supplier. YOUR HOME PROGRAM Cervical Retraction/Extension in Sitting - MDT With good sitting posture, tuck chin back as far as possible without lifting off of bottom line. Then tilt head backward (away from vertical line)as far as you can go. Gently reverse to starting position, repeat. Repeat 13 Times Hold 1 Second Complete 1 Set Perform 4 Time(s) a Day YOUR HOME PROGRAM ELASTIC BAND ROWS Holding elastic band with both hands, draw back the band as you bend your elbows. Keep your elbows near the side of your body. Repeat 10 Times Hold 1 Second Complete 3 Sets Perform 2 Time(s) a Day YOUR HOME PROGRAM Bilateral Shoulder External Rotation Standing or sitting with upright posture, hold the middle of a piece of theraband with both hands. With your elbows at your side and bent at 90 degree angle, pull your hands outward and hold for 3 seconds. You should feel you shoulders roll backward and your chest move forward. Repeat 10 Times Hold 1 Second Complete 3 Sets Perform 2 Time(s) a Day

## 2018-10-08 NOTE — Therapy (Signed)
[facility] [address], [place], [address] Phone: [phone]   Fax:  [phone]  Physical Therapy Evaluation  Patient Details  Name: Alison Rodriguez MRN: [id number] Date of Birth: February 04, 1961 Referring Provider (PT): Einar Pheasant, MD   Encounter Date: 10/08/2018  PT End of Session - 10/08/18 1329    Visit Number  1    Number of Visits  13    Date for PT Re-Evaluation  11/19/18    Authorization Type  [employer]    Authorization Time Period  Current cert period: 56/31/4970 - 11/19/2018 (last PN: IE 10/08/2018)    Authorization - Visit Number  1    Authorization - Number of Visits  10    PT Start Time  0910    PT Stop Time  1010    PT Time Calculation (min)  60 min    Activity Tolerance  Patient tolerated treatment well;Patient limited by pain    Behavior During Therapy  [facility] for tasks assessed/performed       Past Medical History:  Diagnosis Date  . Blood in stool    H/O  . Hyperlipidemia   . Hypertension   . Thyroid disease     Past Surgical History:  Procedure Laterality Date  . UTERINE FIBROID EMBOLIZATION     SUBJECTIVE: HISTORY:  Patient is a 57 y.o. female who presents to outpatient physical therapy with a referral for left shoulder pain. This patient's chief complaints consist of pain, leading to the following functional deficits: difficulty with ADLs, IADLs, dressing, reaching, working out. Relevant past medical history and comorbidities include obesity, headaches Imaging: recent radiographs in physician's office, pt states it showed osteoarthritis.  Response to previously administered skilled services: none  Patient states condition started about a month when patient realized she could not put her coat on because of difficulty reaching behind back. Pt cannot recall an injury.  She was in [place] for a week for vacation a week ago and she did not have difficulty with it while in [place].  Currently does not do any physical activity. Would like to start yoga at first of the year. Was using some weights before she had shoulder pain. 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 Hobbies: shopping, working out 3 days per week, 45 min per day (currently unable due to shoulder pain) PLOF: full time work and all usual activities unlimited.  Current Level of Function: difficulty dressing (putting on jacket/clothing, reaching is difficult, wakes her at night, driving/trying to put on seatbelt, turning quickly to the left, difficulty looking left, unable to workout.   SPINAL HISTORY Denies history of neck pain.   PAIN Location: left superior anterior shoulder, seems to also move sometimes to right shoulder.  Nature: "shock" "nagging" "irritable" "bothersome" "throbbing" Pain Scale:  Patient reports current pain as 5/10, at best 0/10 (in [place]), at worst 7/10, and during functional activity 7/10. Paresthesia: denies. Aggravating factors: cool air, reaching behind back, reaching back, reaching behind head, reaching up, cold pack. Easing factors: Being on vacation in [place] (80 degrees), Advil (mild effect), unsure. 24-hour pattern: worse at night when sleeping (wakes at night but can get back to sleep with different position).    There were no vitals filed for this visit.   Subjective Assessment - 10/08/18 1325    Subjective  Patient states condition started about a month when patient realized  she could not put her coat on because of difficulty reaching behind back. Pt cannot recall an injury.  She was in [place] for a week for vacation a week ago and she did not have difficulty with it while in [place]. Currently does not do any physical activity. Would like to start yoga at first of the year. Was using some weights before she had shoulder pain. 2-3x/week 30-45 min    Pertinent History  Patient  is a 57 y.o. female who presents to outpatient physical therapy with a referral for left shoulder pain. This patient's chief complaints consist of pain, leading to the following functional deficits: difficulty with ADLs, IADLs, dressing, reaching, working out. Relevant past medical history and comorbidities include obesity, headaches Imaging: recent radiographs in physician's office, pt states it showed osteoarthritis.     Diagnostic tests  recent radiographs in physician's office, pt states it showed osteoarthritis.    Patient Stated Goals  return to PLOF without pain. Start going to Yoga Jan 1    Currently in Pain?  Yes    Pain Score  5     Pain Location  Shoulder    Pain Orientation  Left;Other (Comment)    Pain Descriptors / Indicators  Throbbing;Nagging;Other (Comment)   "shock" "nagging" "irritable" "bothersome" "throbbing"   Pain Type  Chronic pain    Pain Radiating Towards  reports minimal radiation.     Pain Onset  More than a month ago    Pain Frequency  Constant    Aggravating Factors   cool air, reaching behind back, reaching back, reaching behind head, reaching up, cold pack.    Pain Relieving Factors  Being on vacation in [place] (80 degrees), Advil (mild effect), unsure    Effect of Pain on Daily Activities  difficulty dressing (putting on jacket/clothing, reaching is difficult, wakes her at night, driving/trying to put on seatbelt, turning quickly to the left, difficulty looking left.          [facility] PT Assessment - 10/08/18 0001      Assessment   Medical Diagnosis  Left shoulder pain, unspecified chronicity    Referring Provider (PT)  Einar Pheasant, MD    Onset Date/Surgical Date  09/10/18    Hand Dominance  Right    Next MD Visit  6 months    Prior Therapy  none      Precautions   Precautions  None      Restrictions   Weight Bearing Restrictions  No    Other Position/Activity Restrictions  no      Balance Screen   Has the patient fallen in the past 6 months  Yes    no injuries that she knows of; slippery floor, shoe broke   How many times?  1    Has the patient had a decrease in activity level because of a fear of falling?   Yes    Is the patient reluctant to leave their home because of a fear of falling?   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    Leisure  shopping, working out 3 days per week, 45 min per day (currently unable due to shoulder pain)  Cognition   Overall Cognitive Status  Within Functional Limits for tasks assessed      Observation/Other Assessments   Observations  see note from 10/08/2018 for latest objective information    Focus on Therapeutic Outcomes (FOTO)   55    Other Surveys   Other Surveys    Quick DASH   15.6%       OBJECTIVE: OBSERVATION/INSPECTION: Patient presents with forward head, rounded shoulders, obesity  NEUROLOGICAL: Dermatomes: WNL BUE to light touch. Myotomes: WNL  SPINE MOTION Cervical Spine AROM:  *Indicates pain - Flexion: = WNL - Extension: = 75% - Rotation: R= 100%, L = 75% concordant pain. - Side Flexion: R= 100%, L = 25% concordant pain.  PERIPHERAL JOINT MOTION (AROM/PROM in degrees):  *Indicates pain Shoulder  - Flexion: R = 160, L = 130 * ERP. - Extension: R = 60, L = 50*  ERP. - Abduction: R = 165, L = 132* ERP. - External rotation: R = 100 , L = 90* ERP. - Internal rotation: R = T8, L = L3 * ERP. Elbow and Wrists grossly WFL  STRENGTH:  *Indicates pain Shoulder  - Flexion: R = 4/5, L = 4/5.  - Abduction: R = 5/5, L = 5/5. - External rotation: R = 4+/5, L = 4/5*. - Internal rotation: R = 4+/5, L = 4/5*. Elbow - Flexion: R = 4+/5, L = 4/5 * with arm supinated (implicates biceps tendon) - Extension: R = 4+/5, L = 5/5. Grip strength:  R>L WNL  REPEATED MOTIONS TESTING: - Seated cervical retraction x 10: during = increasing pain; after = worse - Seated cervical retraction to extension x 10: during = decreasing; after = better  SPECIAL TESTS: Neers: R = positive mild, L = clearly positive. Yergason's: R = negative, L = negative. O'Brien: R = positive mild, L = clearly positive. Hawkins-Kennedy: R = positive mild, L = clearly positive. Cross Over: R = positive mild, L = positive. Axial cervical compression and distraction: negative  ACCESSORY MOTION:  - Testing deferred at this time.   PALPATION: - TTP lateral left shoulder.  Objective measurements completed on examination: See above findings.   TREATMENT:  Denies sensitivity to latex Denies spinal surgeries  Therapeutic exercise: to centralize symptoms and improve ROM and strength required for successful completion of functional activities.  - Seated cervical retraction to extension x 10: during = decreasing; after = better - trial and education about improved posture in sitting using lumbar roll - Standing rows with scapular retraction for improved postural and shoulder girdle strengthening and mobility. Required instruction for technique and cuing to retract, posteriorly tilt, and depress scapulae. X10. Green theraband.  - standing bilateral shoulder ER with palms up using green theraband. Cuing for posture and to keep elbows in and palms level like she is holding a tray of cookies she doesn't want to drop. X 10.   -Education on diagnosis, prognosis, POC, anatomy and physiology of current condition.  -Education on HEP including handout    HOME EXERCISE PROGRAM Access Code: [email or web address]  URL: [email or web address]  Date: 10/08/2018  Prepared by: Rosita Kea   Exercises  Seated Posture with Lumbar Roll  Seated Cervical Retraction and Extension - 10-15 reps - 1 second hold - 4-6x daily  Standing Row with Resistance - 10-15 reps - 1 second hold -  3 Sets - 2x daily - 7x weekly  Shoulder External Rotation and Scapular Retraction with Resistance - 10-15 reps - 1 second hold - 3  Sets - 2x daily - 7x weekly   Patient response to treatment:  Pt tolerated treatment well. Pt was able to complete all exercises with minimal to no lasting increase in pain or discomfort. She demonstrated improvement in concordant signs with cervical retraction/extension. Pt required cuing for proper technique and to facilitate improved neuromuscular control, strength, range of motion, and functional ability.   PT Education - 10/08/18 1329    Education Details  -Education on diagnosis, prognosis, POC, anatomy and physiology of current condition. -Education on HEP including handout     Person(s) Educated  Patient    Methods  Explanation;Demonstration;Tactile cues;Verbal cues;Handout    Comprehension  Verbalized understanding;Returned demonstration       PT Short Term Goals - 10/08/18 1333      PT SHORT TERM GOAL #1   Title  Be independent with home exercise program completed at least 3 times per week for self-management of symptoms.    Baseline  Initial HEP provided at initial eval (10/08/2018)    Time  2    Period  Weeks    Status  New    Target Date  10/22/18        PT Long Term Goals - 10/08/18 1334      PT LONG TERM GOAL #1   Title  Be independent with a long-term home exercise program for self-management of symptoms.     Baseline  Initial HEP provided at initial eval (10/08/2018);    Time  6    Period  Weeks    Status  New    Target Date  11/19/18      PT LONG TERM GOAL #2   Title  Reduce pain with functional activities to equal or less than 1/10 to allow patient to complete usual activities including ADLs, IADLs, and social engagement with less difficulty.     Baseline  during functional activity 7/10 (10/08/2018);    Time  6    Period  Weeks    Status  New    Target Date  11/19/18      PT LONG TERM GOAL #3   Title  Patient will  demonstrate improved ability to perform functional tasks as exhibited by at least 10 point improvement in FOTO score    Baseline  55 (10/08/2018);    Time  6    Period  Weeks    Status  New    Target Date  11/19/18      PT LONG TERM GOAL #4   Title  Complete community, work and/or recreational activities without limitation due to current condition.     Baseline  difficulty dressing (putting on jacket/clothing, reaching is difficult, wakes her at night, driving/trying to put on seatbelt, turning quickly to the left, difficulty looking left, unable to complete usual workout routing (10/08/2018);    Time  6    Period  Weeks    Status  New    Target Date  11/19/18      PT LONG TERM GOAL #5   Title  Have full cervical spine and left shoulder AROM with no increase in pain in all planes except intermittent end range discomfort to allow patient to complete valued activities with less difficulty.     Baseline  see objective exam: limits in cervical spine and left shoulder AROM with pain (10/08/2018);    Time  6    Period  Weeks    Status  New    Target Date  11/19/18  Plan - 10/08/18 1030    Clinical Impression Statement  Patient is a 57 y.o. female referred to outpatient physical therapy with a diagnosis of left shoulder pain who presents with signs and symptoms consistent with left shoulder pain vs cervical spine symptom referral to left shoulder.    History and Personal Factors relevant to plan of care:  obesity, headaches    Clinical Presentation  Evolving    Clinical Presentation due to:  Pt's sympotms fluctuate without explanation.     Clinical Decision Making  Low    Rehab Potential  Good    Clinical Impairments Affecting Rehab Potential  (+) desire to get better, prior level of activity; (-) concern for appearance.     PT Frequency  2x / week    PT Duration  6 weeks    PT Treatment/Interventions  ADLs/Self Care Home Management;Electrical  Stimulation;Cryotherapy;Moist Heat;Functional mobility training;Therapeutic activities;Therapeutic exercise;Neuromuscular re-education;Patient/family education;Manual techniques;Passive range of motion;Dry needling;Taping;Joint Manipulations;Spinal Manipulations;Other (comment)   joint mobilizations grades I-V   PT Next Visit Plan  Assess response to HEP and progress strengthening and specific exercises as appropriate.     PT Home Exercise Plan  [employer] Access Code: [email or web address] (for future visits, pt initially provided with [email or web address]" using code: [phone] due to [place] not working at initial eval.     Consulted and Agree with Plan of Care  Patient      ASSESSMENT:  Patient is a 57 y.o. female referred to outpatient physical therapy with a diagnosis of left shoulder pain who presents with signs and symptoms consistent with left shoulder pain vs cervical spine symptom referral to left shoulder.  Patient will benefit from skilled therapeutic intervention in order to improve the following deficits and impairments:  Decreased endurance, Decreased mobility, Hypomobility, Decreased range of motion, Impaired perceived functional ability, Obesity, Improper body mechanics, Decreased activity tolerance, Decreased strength, Impaired flexibility, Impaired UE functional use, Pain, Postural dysfunction  Visit Diagnosis: Chronic left shoulder pain  Cervicalgia  Muscle weakness (generalized)     Problem List Patient Active Problem List   Diagnosis Date Noted  . Herpes zoster 05/10/2018  . Urinary frequency 05/10/2018  . Trichomonas infection 05/10/2018  . Abnormal chest CT 02/07/2016  . Health care maintenance 01/25/2015  . Dysuria 07/19/2014  . BMI 32.0-32.9,adult 07/19/2014  . Hemorrhoids 11/30/2013  . Abnormal Pap smear of cervix 11/30/2013  . Chest tightness 11/30/2013  . Headache 11/26/2013  . Hypercholesterolemia 11/26/2013  . Blood pressure elevated without history of  HTN 11/26/2013    Nancy Nordmann, PT, DPT 10/08/2018, 1:38 PM  [facility] [address] [place], [address] Phone: [phone]   Fax:  [phone]  Name: Alison Rodriguez MRN: [id number] Date of Birth: [birth year]

## 2018-10-10 ENCOUNTER — Ambulatory Visit: Payer: 59 | Admitting: Physical Therapy

## 2018-10-10 ENCOUNTER — Encounter: Payer: Self-pay | Admitting: Physical Therapy

## 2018-10-10 DIAGNOSIS — G8929 Other chronic pain: Secondary | ICD-10-CM

## 2018-10-10 DIAGNOSIS — M25512 Pain in left shoulder: Principal | ICD-10-CM

## 2018-10-10 DIAGNOSIS — M542 Cervicalgia: Secondary | ICD-10-CM

## 2018-10-10 DIAGNOSIS — M6281 Muscle weakness (generalized): Secondary | ICD-10-CM

## 2018-10-10 NOTE — Therapy (Signed)
[facility] [address], [place], [address] Phone: [phone]   Fax:  [phone]  Physical Therapy Treatment  Patient Details  Name: Alison Rodriguez MRN: [id number] Date of Birth: August 24, 1961 Referring Provider (PT): Einar Pheasant, MD   Encounter Date: 10/10/2018  PT End of Session - 10/10/18 0944    Visit Number  2    Number of Visits  13    Date for PT Re-Evaluation  11/19/18    Authorization Type  [employer]    Authorization Time Period  Current cert period: 29/52/8413 - 11/19/2018 (last PN: IE 10/08/2018)    Authorization - Visit Number  2    Authorization - Number of Visits  10    PT Start Time  0945    PT Stop Time  1030    PT Time Calculation (min)  45 min    Activity Tolerance  Patient tolerated treatment well;Patient limited by pain    Behavior During Therapy  [facility] for tasks assessed/performed       Past Medical History:  Diagnosis Date  . Blood in stool    H/O  . Hyperlipidemia   . Hypertension   . Thyroid disease     Past Surgical History:  Procedure Laterality Date  . UTERINE FIBROID EMBOLIZATION      There were no vitals filed for this visit.  Subjective Assessment - 10/10/18 0947    Subjective  Patient reports she is feeling well this morning. No significant change in shoulder pain since initial eval. She has had some trouble getting all of her daily episodes of HEP in due to getting busy but otherwise is doing some of it.  did not wake up due to shoulder pain last night.     Pertinent History  Patient is a 57 y.o. female who presents to outpatient physical therapy with a referral for left shoulder pain. This patient's chief complaints consist of pain, leading to the following functional deficits: difficulty with ADLs, IADLs, dressing, reaching, working out. Relevant past medical history and comorbidities include obesity, headaches Imaging: recent radiographs in physician's office, pt  states it showed osteoarthritis.     Diagnostic tests  recent radiographs in physician's office, pt states it showed osteoarthritis.    Patient Stated Goals  return to PLOF without pain. Start going to Yoga Jan 1    Currently in Pain?  No/denies    Pain Onset  More than a month ago               PT Education - 10/10/18 0944    Education Details  exercise form/purpose    Person(s) Educated  Patient    Methods  Explanation;Demonstration;Tactile cues;Verbal cues    Comprehension  Verbalized understanding;Returned demonstration     TREATMENT:  Denies sensitivity to latex Denies spinal surgeries  Therapeutic exercise: to centralize symptoms and improve ROM and strength required for successful completion of functional activities.  - Upper body ergometer no added resistance forward encourage joint nutrition, warm tissue, induce analgesic effect of aerobic exercise, improve muscular strength and endurance,  and prepare for remainder of session. X 5 min during subjective exam.   - Seated cervical retraction to extension x 15, cuing for improved form.  - wall slides flexion and abduction, 5 second hold, x 10 - Standing rows with scapular retraction for improved postural and shoulder girdle strengthening and mobility. Required instruction for technique and cuing to retract, posteriorly tilt, and depress scapulae. X30. Nyoka Cowden  theraband.  - standing bilateral shoulder ER with palms up using green theraband. Cuing for posture and to keep elbows in and palms level like she is holding a tray of cookies she doesn't want to drop. 1x20. 1x10 - Standing B SHLD extension with scapular retraction for improved postural and shoulder girdle strengthening and mobility. Required instruction for technique and cuing to retract, posteriorly tilt, and depress scapulae as well as keep elbows extended.  Red theraband (unable to maintain form with green theraband). X30. - prone scapular retraction T's, 3x10 -  attempted prone scapular retraction Ys but discontinued due to discomfort.  - 30 second pec stretch in doorway.   Manual therapy: to reduce pain and tissue tension, improve range of motion, neuromodulation, in order to promote improved ability to complete functional activities. - supine STM to bilateral upper trap, levator scap, posterior cervical spine muscles, left lateral deltoid.  - supine GH joint mobilizations, caudal glide in neutral, and abduction, grade II-III to reduce pain and improve motion.    HOME EXERCISE PROGRAM Access Code: [email or web address]  URL: [email or web address]  Date: 10/08/2018  Prepared by: Rosita Kea   Exercises   Seated Posture with Lumbar Roll   Seated Cervical Retraction and Extension - 10-15 reps - 1 second hold - 4-6x daily   Standing Row with Resistance - 10-15 reps - 1 second hold - 3 Sets - 2x daily - 7x weekly   Shoulder External Rotation and Scapular Retraction with Resistance - 10-15 reps - 1 second hold - 3 Sets - 2x daily - 7x weekly   Patient response to treatment:  Pt tolerated treatment well. Pt was able to complete all exercises with minimal to no lasting increase in pain or discomfort including progressions. She demonstrated improvement in concordant signs with cervical retraction/extension. Pt required cuing for proper technique and to facilitate improved neuromuscular control, strength, range of motion, and functional ability  PT Short Term Goals - 10/08/18 1333      PT SHORT TERM GOAL #1   Title  Be independent with home exercise program completed at least 3 times per week for self-management of symptoms.    Baseline  Initial HEP provided at initial eval (10/08/2018)    Time  2    Period  Weeks    Status  New    Target Date  10/22/18        PT Long Term Goals - 10/08/18 1334      PT LONG TERM GOAL #1   Title  Be independent with a long-term home exercise program for self-management of symptoms.     Baseline  Initial  HEP provided at initial eval (10/08/2018);    Time  6    Period  Weeks    Status  New    Target Date  11/19/18      PT LONG TERM GOAL #2   Title  Reduce pain with functional activities to equal or less than 1/10 to allow patient to complete usual activities including ADLs, IADLs, and social engagement with less difficulty.     Baseline  during functional activity 7/10 (10/08/2018);    Time  6    Period  Weeks    Status  New    Target Date  11/19/18      PT LONG TERM GOAL #3   Title  Patient will demonstrate improved ability to perform functional tasks as exhibited by at least 10 point improvement in FOTO score    Baseline  55 (10/08/2018);  Time  6    Period  Weeks    Status  New    Target Date  11/19/18      PT LONG TERM GOAL #4   Title  Complete community, work and/or recreational activities without limitation due to current condition.     Baseline  difficulty dressing (putting on jacket/clothing, reaching is difficult, wakes her at night, driving/trying to put on seatbelt, turning quickly to the left, difficulty looking left, unable to complete usual workout routing (10/08/2018);    Time  6    Period  Weeks    Status  New    Target Date  11/19/18      PT LONG TERM GOAL #5   Title  Have full cervical spine and left shoulder AROM with no increase in pain in all planes except intermittent end range discomfort to allow patient to complete valued activities with less difficulty.     Baseline  see objective exam: limits in cervical spine and left shoulder AROM with pain (10/08/2018);    Time  6    Period  Weeks    Status  New    Target Date  11/19/18            Plan - 10/10/18 1032    Clinical Impression Statement  Pt tolerated treatment well. Pt was able to complete all exercises with minimal to no lasting increase in pain or discomfort including progressions. She demonstrated improvement in concordant signs with cervical retraction/extension. Pt required cuing for  proper technique and to facilitate improved neuromuscular control, strength, range of motion, and functional ability    Rehab Potential  Good    Clinical Impairments Affecting Rehab Potential  (+) desire to get better, prior level of activity; (-) concern for appearance.     PT Frequency  2x / week    PT Duration  6 weeks    PT Treatment/Interventions  ADLs/Self Care Home Management;Electrical Stimulation;Cryotherapy;Moist Heat;Functional mobility training;Therapeutic activities;Therapeutic exercise;Neuromuscular re-education;Patient/family education;Manual techniques;Passive range of motion;Dry needling;Taping;Joint Manipulations;Spinal Manipulations;Other (comment)   joint mobilizations grades I-V   PT Next Visit Plan  Assess response to HEP and progress strengthening and specific exercises as appropriate.     PT Home Exercise Plan  [employer] Access Code: [email or web address] (for future visits, pt initially provided with [email or web address]" using code: [phone] due to [place] not working at initial eval.     Consulted and Agree with Plan of Care  Patient       Patient will benefit from skilled therapeutic intervention in order to improve the following deficits and impairments:  Decreased endurance, Decreased mobility, Hypomobility, Decreased range of motion, Impaired perceived functional ability, Obesity, Improper body mechanics, Decreased activity tolerance, Decreased strength, Impaired flexibility, Impaired UE functional use, Pain, Postural dysfunction  Visit Diagnosis: Chronic left shoulder pain  Cervicalgia  Muscle weakness (generalized)     Problem List Patient Active Problem List   Diagnosis Date Noted  . Herpes zoster 05/10/2018  . Urinary frequency 05/10/2018  . Trichomonas infection 05/10/2018  . Abnormal chest CT 02/07/2016  . Health care maintenance 01/25/2015  . Dysuria 07/19/2014  . BMI 32.0-32.9,adult 07/19/2014  . Hemorrhoids 11/30/2013  . Abnormal Pap smear of  cervix 11/30/2013  . Chest tightness 11/30/2013  . Headache 11/26/2013  . Hypercholesterolemia 11/26/2013  . Blood pressure elevated without history of HTN 11/26/2013    Nancy Nordmann, PT, DPT 10/10/2018, 10:33 AM  [facility] [address],  [place], [address] Phone: [phone]   Fax:  [phone]  Name: Maryalyce Sanjuan MRN: [id number] Date of Birth: 04-14-1961

## 2018-10-15 ENCOUNTER — Ambulatory Visit: Payer: 59 | Admitting: Physical Therapy

## 2018-10-15 ENCOUNTER — Encounter: Payer: Self-pay | Admitting: Physical Therapy

## 2018-10-15 DIAGNOSIS — M542 Cervicalgia: Secondary | ICD-10-CM

## 2018-10-15 DIAGNOSIS — M25512 Pain in left shoulder: Secondary | ICD-10-CM | POA: Diagnosis not present

## 2018-10-15 DIAGNOSIS — M6281 Muscle weakness (generalized): Secondary | ICD-10-CM

## 2018-10-15 DIAGNOSIS — G8929 Other chronic pain: Secondary | ICD-10-CM

## 2018-10-15 NOTE — Therapy (Signed)
[facility] [address] [place], [address] Phone: [phone]   Fax:  [phone]  Physical Therapy Treatment  Patient Details  Name: Alison Rodriguez MRN: [id number] Date of Birth: [birth year] Referring Provider (PT): Einar Pheasant, MD   Encounter Date: 10/15/2018  PT End of Session - 10/15/18 0912    Visit Number  3    Number of Visits  13    Date for PT Re-Evaluation  11/19/18    Authorization Type  [employer]    Authorization Time Period  Current cert period: 53/66/4403 - 11/19/2018 (last PN: IE 10/08/2018)    Authorization - Visit Number  3    Authorization - Number of Visits  10    PT Start Time  0905    PT Stop Time  0945    PT Time Calculation (min)  40 min    Activity Tolerance  Patient tolerated treatment well    Behavior During Therapy  [facility] for tasks assessed/performed       Past Medical History:  Diagnosis Date  . Blood in stool    H/O  . Hyperlipidemia   . Hypertension   . Thyroid disease     Past Surgical History:  Procedure Laterality Date  . UTERINE FIBROID EMBOLIZATION      There were no vitals filed for this visit.  Subjective Assessment - 10/15/18 0906    Subjective  Patient reports no pain upon arrival. She states she had increased pain a few hours after her last treatment session for about 10 minutes. On friday she was sitting on her way home and it was "throbbing" for about 5-10 min.  She continues to have short duration increased pain (up to 10 min) every so often. She tends to report sitting when hit happened. She states she is doing her HEP but all at once.     Pertinent History  Patient is a 57 y.o. female who presents to outpatient physical therapy with a referral for left shoulder pain. This patient's chief complaints consist of pain, leading to the following functional deficits: difficulty with ADLs, IADLs, dressing, reaching, working out. Relevant past medical history and  comorbidities include obesity, headaches Imaging: recent radiographs in physician's office, pt states it showed osteoarthritis.     Diagnostic tests  recent radiographs in physician's office, pt states it showed osteoarthritis.    Patient Stated Goals  return to PLOF without pain. Start going to Yoga Jan 1    Currently in Pain?  No/denies    Pain Onset  More than a month ago    Pain Frequency  Intermittent         PT Education - 10/15/18 0912    Education Details  exercise form/purpose    Person(s) Educated  Patient    Methods  Explanation;Demonstration;Verbal cues    Comprehension  Verbalized understanding;Returned demonstration       TREATMENT: Denies sensitivity to latex Denies hx of spinal surgeries  Therapeutic exercise:to centralize symptoms and improve ROM and strength required for successful completion of functional activities.  - Upper body ergometer no added resistance forwardto encourage joint nutrition, warm tissue, induce analgesic effect of aerobic exercise, improve muscular strength and endurance, and prepare for remainder of session. X 5 min during subjective exam.   - Seated cervical retraction to extension 2x 15, cuing for improved form.  -Standing rows with scapular retraction for improved postural and shoulder girdle strengthening and mobility. Required instruction for technique and cuing  to retract, posteriorly tilt, and depress scapulae.x15 at 10#, x15 at 15#, x 15 at 20# (beginning to fatigue).  - standing shoulder ER with towel roll under arm .  Cuing for posture and to keep elbows in and palms level like she is holding a tray of cookies she doesn't want to drop. L = 2x10 with yellow theraband (fatigue). R = 2x15 with green theraband (to fatigue).  -standing shoulder IR with towel roll under arm with cuing to keep body still and for posture and technique;  L = x23 at green theraband, x15 at blue theraband (fatigued); R = x15 at blue theraband, x15 at blue+red  theraband (reports lack of fatigue).    - seated scapular depression, to lat pull and back in a stepwise fashion x 10 to tolerance with heavy cuing to separate movements. Pt reported discomfort in left shoulder so stopped and discussed communication about pain. #20 - Standing B SHLD extension with scapular retraction for improved postural and shoulder girdle strengthening and mobility. Required instruction for technique and cuing to retract, posteriorly tilt, and depress scapulae as well as keep elbows extended.  Red theraband x10, green theraband x 10, blue theraband x 10..  .    HOME EXERCISE PROGRAM Access Code: [email or web address]  URL: [email or web address]  Date: 10/08/2018  Prepared by: Rosita Kea   Exercises   Seated Posture with Lumbar Roll   Seated Cervical Retraction and Extension - 10-15 reps - 1 second hold - 4-6x daily   Standing Row with Resistance - 10-15 reps - 1 second hold - 3 Sets - 2x daily - 7x weekly   Shoulder External Rotation and Scapular Retraction with Resistance - 10-15 reps - 1 second hold - 3 Sets - 2x daily - 7x weekly  Patient response to treatment:  Pt tolerated treatment well. Pt was able to complete all exercises with minimal to no lasting increase in pain or discomfort including progressions. Finding appropriate exercises load took adjustments over several sets for several of today's activities to find a load that fatigued pt by 15 reps, and that she could perform without unacceptable pain or break in form. Patient continues to require cuing for repeated cervical retraction to extension and encouragement to perform it interspersed throughout the day. She demonstrates less tolerance to strengthening exercises with left shoulder ER and overhead shoulder depression to lat pull on left side but did not have lasting increased pain.Pt required multimodal cuing for proper technique and to facilitate improved neuromuscular control, strength, range of  motion, and functional ability with improvements in form as a result. Patient is making progress towards goals at this point.   PT Short Term Goals - 10/08/18 1333      PT SHORT TERM GOAL #1   Title  Be independent with home exercise program completed at least 3 times per week for self-management of symptoms.    Baseline  Initial HEP provided at initial eval (10/08/2018)    Time  2    Period  Weeks    Status  New    Target Date  10/22/18        PT Long Term Goals - 10/08/18 1334      PT LONG TERM GOAL #1   Title  Be independent with a long-term home exercise program for self-management of symptoms.     Baseline  Initial HEP provided at initial eval (10/08/2018);    Time  6    Period  Weeks    Status  New  Target Date  11/19/18      PT LONG TERM GOAL #2   Title  Reduce pain with functional activities to equal or less than 1/10 to allow patient to complete usual activities including ADLs, IADLs, and social engagement with less difficulty.     Baseline  during functional activity 7/10 (10/08/2018);    Time  6    Period  Weeks    Status  New    Target Date  11/19/18      PT LONG TERM GOAL #3   Title  Patient will demonstrate improved ability to perform functional tasks as exhibited by at least 10 point improvement in FOTO score    Baseline  55 (10/08/2018);    Time  6    Period  Weeks    Status  New    Target Date  11/19/18      PT LONG TERM GOAL #4   Title  Complete community, work and/or recreational activities without limitation due to current condition.     Baseline  difficulty dressing (putting on jacket/clothing, reaching is difficult, wakes her at night, driving/trying to put on seatbelt, turning quickly to the left, difficulty looking left, unable to complete usual workout routing (10/08/2018);    Time  6    Period  Weeks    Status  New    Target Date  11/19/18      PT LONG TERM GOAL #5   Title  Have full cervical spine and left shoulder AROM with no increase  in pain in all planes except intermittent end range discomfort to allow patient to complete valued activities with less difficulty.     Baseline  see objective exam: limits in cervical spine and left shoulder AROM with pain (10/08/2018);    Time  6    Period  Weeks    Status  New    Target Date  11/19/18            Plan - 10/15/18 1036    Clinical Impression Statement  Pt tolerated treatment well. Pt was able to complete all exercises with minimal to no lasting increase in pain or discomfort including progressions. Finding appropriate exercises load took adjustments over several sets for several of today's activities to find a load that fatigued pt by 15 reps, and that she could perform without unacceptable pain or break in form. Patient continues to require cuing for repeated cervical retraction to extension and encouragement to perform it interspersed throughout the day. She demonstrates less tolerance to strengthening exercises with left shoulder ER and overhead shoulder depression to lat pull on left side but did not have lasting increased pain.Pt required multimodal cuing for proper technique and to facilitate improved neuromuscular control, strength, range of motion, and functional ability with improvements in form as a result. Patient is making progress towards goals at this point.     Rehab Potential  Good    Clinical Impairments Affecting Rehab Potential  (+) desire to get better, prior level of activity; (-) concern for appearance.     PT Frequency  2x / week    PT Duration  6 weeks    PT Treatment/Interventions  ADLs/Self Care Home Management;Electrical Stimulation;Cryotherapy;Moist Heat;Functional mobility training;Therapeutic activities;Therapeutic exercise;Neuromuscular re-education;Patient/family education;Manual techniques;Passive range of motion;Dry needling;Taping;Joint Manipulations;Spinal Manipulations;Other (comment)   joint mobilizations grades I-V   PT Next Visit Plan   progress strengthening and specific exercises as appropriate. continue to educate on proper form and provide interventions that encourage self-efficacy.  PT Home Exercise Plan  [employer] Access Code: [email or web address]     Consulted and Agree with Plan of Care  Patient       Patient will benefit from skilled therapeutic intervention in order to improve the following deficits and impairments:  Decreased endurance, Decreased mobility, Hypomobility, Decreased range of motion, Impaired perceived functional ability, Obesity, Improper body mechanics, Decreased activity tolerance, Decreased strength, Impaired flexibility, Impaired UE functional use, Pain, Postural dysfunction  Visit Diagnosis: Chronic left shoulder pain  Cervicalgia  Muscle weakness (generalized)     Problem List Patient Active Problem List   Diagnosis Date Noted  . Herpes zoster 05/10/2018  . Urinary frequency 05/10/2018  . Trichomonas infection 05/10/2018  . Abnormal chest CT 02/07/2016  . Health care maintenance 01/25/2015  . Dysuria 07/19/2014  . BMI 32.0-32.9,adult 07/19/2014  . Hemorrhoids 11/30/2013  . Abnormal Pap smear of cervix 11/30/2013  . Chest tightness 11/30/2013  . Headache 11/26/2013  . Hypercholesterolemia 11/26/2013  . Blood pressure elevated without history of HTN 11/26/2013    Nancy Nordmann, PT, DPT 10/15/2018, 10:38 AM  [facility] [address] [place], [address] Phone: [phone]   Fax:  [phone]  Name: Alison Rodriguez MRN: [id number] Date of Birth: [birth year]

## 2018-10-22 ENCOUNTER — Ambulatory Visit: Payer: 59 | Attending: Internal Medicine | Admitting: Physical Therapy

## 2018-10-22 DIAGNOSIS — M6281 Muscle weakness (generalized): Secondary | ICD-10-CM

## 2018-10-22 DIAGNOSIS — G8929 Other chronic pain: Secondary | ICD-10-CM | POA: Insufficient documentation

## 2018-10-22 DIAGNOSIS — M25512 Pain in left shoulder: Secondary | ICD-10-CM | POA: Insufficient documentation

## 2018-10-22 DIAGNOSIS — M542 Cervicalgia: Secondary | ICD-10-CM

## 2018-10-22 NOTE — Therapy (Signed)
[facility] [address], [place], [address] Phone: [phone]   Fax:  [phone]  Physical Therapy Treatment  Patient Details  Name: Alison Rodriguez MRN: [id number] Date of Birth: 01/13/1961 Referring Provider (PT): Einar Pheasant, MD   Encounter Date: 10/22/2018  PT End of Session - 10/22/18 0926    Visit Number  4    Number of Visits  13    Date for PT Re-Evaluation  11/19/18    Authorization Type  [employer]    Authorization Time Period  Current cert period: 59/74/1638 - 11/19/2018 (last PN: IE 10/08/2018)    Authorization - Visit Number  4    Authorization - Number of Visits  10    PT Start Time  0840    PT Stop Time  0925    PT Time Calculation (min)  45 min    Activity Tolerance  Patient tolerated treatment well    Behavior During Therapy  [facility] for tasks assessed/performed       Past Medical History:  Diagnosis Date  . Blood in stool    H/O  . Hyperlipidemia   . Hypertension   . Thyroid disease     Past Surgical History:  Procedure Laterality Date  . UTERINE FIBROID EMBOLIZATION      There were no vitals filed for this visit.  Subjective Assessment - 10/22/18 0840    Subjective  Patient states that PT must be working because she feeling as much pain. Patient noticed some tingling but is noticing it whenever the temperature is lower. Patient still notices limitations with L IR during dressing activities.     Pertinent History  Patient is a 57 y.o. female who presents to outpatient physical therapy with a referral for left shoulder pain. This patient's chief complaints consist of pain, leading to the following functional deficits: difficulty with ADLs, IADLs, dressing, reaching, working out. Relevant past medical history and comorbidities include obesity, headaches Imaging: recent radiographs in physician's office, pt states it showed osteoarthritis.     Diagnostic tests  recent radiographs in  physician's office, pt states it showed osteoarthritis.    Patient Stated Goals  return to PLOF without pain. Start going to Yoga Jan 1    Currently in Pain?  No/denies    Pain Onset  More than a month ago      TREATMENT Therapeutic exercise: to centralize symptoms and improve ROM and strength required for successful completion of functional activities.  - Upper body ergometer no added resistance forward to encourage joint nutrition, warm tissue, induce analgesic effect of aerobic exercise, improve muscular strength and endurance, and prepare for remainder of session. X 5 min during subjective exam.   - Seated cervical retraction to extension 2x 15, cuing for improved form.  - Standing rows with scapular retraction for improved postural and shoulder girdle strengthening and mobility. Required instruction for technique and cuing to retract, posteri orly tilt, and depress scapulae. x15 at 15#, x10 at 20#, x5 at 25# (beginning to fatigue).  - standing shoulder ER with towel roll under arm .  Cuing for posture and to keep elbows in and palms level like she is holding a tray of cookies she doesn't want to drop. L = 2x10 with red theraband, x5 yellow theraband (fatigue). R = 2x15 with green theraband (to fatigue).  -standing shoulder IR with towel roll under arm with cuing to keep body still and for posture and technique;  L = x30  at green theraband (fatigued); R = x30 at blue theraband (reports lack of fatigue).    - seated scapular depression, to lat pull and back in a stepwise fashion x 10 to tolerance with heavy cuing to initiate with scapular depression #10. Patient had noted difficulty coordinating the movement pattern so we transitioned to prone position unweighted and isolated movement to shoulder elevation and depression without accessory movements from elbow and thoracolumbar junction. - Standing B SHLD extension with scapular retraction for improved postural and shoulder girdle strengthening and  mobility. Required instruction for technique and cuing to retract, posteriorly tilt, and depress scapulae as well as keep elbows extended.  Red theraband x10, green theraband x 20 .   PT Short Term Goals - 10/08/18 1333      PT SHORT TERM GOAL #1   Title  Be independent with home exercise program completed at least 3 times per week for self-management of symptoms.    Baseline  Initial HEP provided at initial eval (10/08/2018)    Time  2    Period  Weeks    Status  New    Target Date  10/22/18        PT Long Term Goals - 10/08/18 1334      PT LONG TERM GOAL #1   Title  Be independent with a long-term home exercise program for self-management of symptoms.     Baseline  Initial HEP provided at initial eval (10/08/2018);    Time  6    Period  Weeks    Status  New    Target Date  11/19/18      PT LONG TERM GOAL #2   Title  Reduce pain with functional activities to equal or less than 1/10 to allow patient to complete usual activities including ADLs, IADLs, and social engagement with less difficulty.     Baseline  during functional activity 7/10 (10/08/2018);    Time  6    Period  Weeks    Status  New    Target Date  11/19/18      PT LONG TERM GOAL #3   Title  Patient will demonstrate improved ability to perform functional tasks as exhibited by at least 10 point improvement in FOTO score    Baseline  55 (10/08/2018);    Time  6    Period  Weeks    Status  New    Target Date  11/19/18      PT LONG TERM GOAL #4   Title  Complete community, work and/or recreational activities without limitation due to current condition.     Baseline  difficulty dressing (putting on jacket/clothing, reaching is difficult, wakes her at night, driving/trying to put on seatbelt, turning quickly to the left, difficulty looking left, unable to complete usual workout routing (10/08/2018);    Time  6    Period  Weeks    Status  New    Target Date  11/19/18      PT LONG TERM GOAL #5   Title  Have full  cervical spine and left shoulder AROM with no increase in pain in all planes except intermittent end range discomfort to allow patient to complete valued activities with less difficulty.     Baseline  see objective exam: limits in cervical spine and left shoulder AROM with pain (10/08/2018);    Time  6    Period  Weeks    Status  New    Target Date  11/19/18  Plan - 10/22/18 0927    Clinical Impression Statement  Patient presents to clinic with maintained improvements in pain and is amenable to therapy. Patient demonstrated increased awareness of body position and movement patterns during all UE exercises, but still requires frequent verbal cues to maintain form. Additionally, patient demonstrates excellent motivation to the point that she sometimes pushes through pain and requires verbal cues to modify or discontinue exercise. Patient will continue to benefit from skilled therapeutic intervention to address deficits in ROM and strength in order to return to PLOF.    Rehab Potential  Good    Clinical Impairments Affecting Rehab Potential  (+) desire to get better, prior level of activity; (-) concern for appearance.     PT Frequency  2x / week    PT Duration  6 weeks    PT Treatment/Interventions  ADLs/Self Care Home Management;Electrical Stimulation;Cryotherapy;Moist Heat;Functional mobility training;Therapeutic activities;Therapeutic exercise;Neuromuscular re-education;Patient/family education;Manual techniques;Passive range of motion;Dry needling;Taping;Joint Manipulations;Spinal Manipulations;Other (comment)   joint mobilizations grades I-V   PT Next Visit Plan  progress strengthening and specific exercises as appropriate. continue to educate on proper form and provide interventions that encourage self-efficacy.     PT Home Exercise Plan  [employer] Access Code: [email or web address]     Consulted and Agree with Plan of Care  Patient       Patient will benefit from skilled therapeutic  intervention in order to improve the following deficits and impairments:  Decreased endurance, Decreased mobility, Hypomobility, Decreased range of motion, Impaired perceived functional ability, Obesity, Improper body mechanics, Decreased activity tolerance, Decreased strength, Impaired flexibility, Impaired UE functional use, Pain, Postural dysfunction  Visit Diagnosis: Chronic left shoulder pain  Cervicalgia  Muscle weakness (generalized)     Problem List Patient Active Problem List   Diagnosis Date Noted  . Herpes zoster 05/10/2018  . Urinary frequency 05/10/2018  . Trichomonas infection 05/10/2018  . Abnormal chest CT 02/07/2016  . Health care maintenance 01/25/2015  . Dysuria 07/19/2014  . BMI 32.0-32.9,adult 07/19/2014  . Hemorrhoids 11/30/2013  . Abnormal Pap smear of cervix 11/30/2013  . Chest tightness 11/30/2013  . Headache 11/26/2013  . Hypercholesterolemia 11/26/2013  . Blood pressure elevated without history of HTN 11/26/2013   Myles Gip PT, DPT [phone] 10/22/2018, 9:33 AM   [facility] [address] [place], [address] Phone: [phone]   Fax:  [phone]  Name: Jamilah Jean MRN: [id number] Date of Birth: 06/07/1961

## 2018-10-29 ENCOUNTER — Ambulatory Visit: Payer: 59

## 2018-10-29 DIAGNOSIS — M25512 Pain in left shoulder: Secondary | ICD-10-CM | POA: Diagnosis not present

## 2018-10-29 DIAGNOSIS — M542 Cervicalgia: Secondary | ICD-10-CM

## 2018-10-29 DIAGNOSIS — M6281 Muscle weakness (generalized): Secondary | ICD-10-CM

## 2018-10-29 DIAGNOSIS — G8929 Other chronic pain: Secondary | ICD-10-CM

## 2018-10-29 NOTE — Therapy (Signed)
[facility] [address] [place], [address] Phone: [phone]   Fax:  [phone]  Physical Therapy Treatment  Patient Details  Name: Alison Rodriguez MRN: [id number] Date of Birth: [birth year] Referring Provider (PT): Einar Pheasant, MD   Encounter Date: 10/29/2018  PT End of Session - 10/29/18 1051    Visit Number  5    Number of Visits  13    Date for PT Re-Evaluation  11/19/18    Authorization Type  [employer]    Authorization Time Period  Current cert period: 40/08/2724 - 11/19/2018 (last PN: IE 10/08/2018)    Authorization - Visit Number  5    Authorization - Number of Visits  10    PT Start Time  0815    PT Stop Time  0900    PT Time Calculation (min)  45 min    Activity Tolerance  Patient tolerated treatment well    Behavior During Therapy  [facility] for tasks assessed/performed       Past Medical History:  Diagnosis Date  . Blood in stool    H/O  . Hyperlipidemia   . Hypertension   . Thyroid disease     Past Surgical History:  Procedure Laterality Date  . UTERINE FIBROID EMBOLIZATION      There were no vitals filed for this visit.  Subjective Assessment - 10/29/18 1047    Subjective  Patient reports no increase in pain since the previous session. Patient states she's been improving overall.     Pertinent History  Patient is a 57 y.o. female who presents to outpatient physical therapy with a referral for left shoulder pain. This patient's chief complaints consist of pain, leading to the following functional deficits: difficulty with ADLs, IADLs, dressing, reaching, working out. Relevant past medical history and comorbidities include obesity, headaches Imaging: recent radiographs in physician's office, pt states it showed osteoarthritis.     Diagnostic tests  recent radiographs in physician's office, pt states it showed osteoarthritis.    Patient Stated Goals  return to PLOF without pain. Start  going to Yoga Jan 1    Currently in Pain?  No/denies    Pain Onset  More than a month ago       TREATMENT Therapeutic Exercise Shoulder scaption overhead - x 20 2# Standing shoulder ER against wall with UE abducted to 90 degrees - x 20 ER with RTB around wrists facing the wall - x 20 Shoulder flexion with YTB around wrists - x 10 Straight arm push downs with 17# at [place] - x 20  Standing scapular retractions at [place] - x x 20 17# Body blade large IR/ER with elbow flexion - 2 x 45sec Patient reports increased fatigue at the end of the session  PT Education - 10/29/18 1050    Education Details  form/technique with exercise; shoulder scaption in standing with 2# weight, shoulder ER against wall added to HEP    Person(s) Educated  Patient    Methods  Explanation;Demonstration    Comprehension  Verbalized understanding;Returned demonstration       PT Short Term Goals - 10/08/18 1333      PT SHORT TERM GOAL #1   Title  Be independent with home exercise program completed at least 3 times per week for self-management of symptoms.    Baseline  Initial HEP provided at initial eval (10/08/2018)    Time  2    Period  Weeks    Status  New    Target Date  10/22/18        PT Long Term Goals - 10/08/18 1334      PT LONG TERM GOAL #1   Title  Be independent with a long-term home exercise program for self-management of symptoms.     Baseline  Initial HEP provided at initial eval (10/08/2018);    Time  6    Period  Weeks    Status  New    Target Date  11/19/18      PT LONG TERM GOAL #2   Title  Reduce pain with functional activities to equal or less than 1/10 to allow patient to complete usual activities including ADLs, IADLs, and social engagement with less difficulty.     Baseline  during functional activity 7/10 (10/08/2018);    Time  6    Period  Weeks    Status  New    Target Date  11/19/18      PT LONG TERM GOAL #3   Title  Patient will demonstrate improved ability to  perform functional tasks as exhibited by at least 10 point improvement in FOTO score    Baseline  55 (10/08/2018);    Time  6    Period  Weeks    Status  New    Target Date  11/19/18      PT LONG TERM GOAL #4   Title  Complete community, work and/or recreational activities without limitation due to current condition.     Baseline  difficulty dressing (putting on jacket/clothing, reaching is difficult, wakes her at night, driving/trying to put on seatbelt, turning quickly to the left, difficulty looking left, unable to complete usual workout routing (10/08/2018);    Time  6    Period  Weeks    Status  New    Target Date  11/19/18      PT LONG TERM GOAL #5   Title  Have full cervical spine and left shoulder AROM with no increase in pain in all planes except intermittent end range discomfort to allow patient to complete valued activities with less difficulty.     Baseline  see objective exam: limits in cervical spine and left shoulder AROM with pain (10/08/2018);    Time  6    Period  Weeks    Status  New    Target Date  11/19/18            Plan - 10/29/18 1051    Clinical Impression Statement  Paitent demonstrates decreased shoulder ER on the L UE versus the R and decreased strength with flexion and ER. Patient demonstrates improved carryover with pain and will continue to benefit from further skilled therapy to return to functional activities and return to prior level of function.     Rehab Potential  Good    Clinical Impairments Affecting Rehab Potential  (+) desire to get better, prior level of activity; (-) concern for appearance.     PT Frequency  2x / week    PT Duration  6 weeks    PT Treatment/Interventions  ADLs/Self Care Home Management;Electrical Stimulation;Cryotherapy;Moist Heat;Functional mobility training;Therapeutic activities;Therapeutic exercise;Neuromuscular re-education;Patient/family education;Manual techniques;Passive range of motion;Dry needling;Taping;Joint  Manipulations;Spinal Manipulations;Other (comment)   joint mobilizations grades I-V   PT Next Visit Plan  progress strengthening and specific exercises as appropriate. continue to educate on proper form and provide interventions that encourage self-efficacy.     PT Home Exercise Plan  [employer] Access Code: [email or web address]  Consulted and Agree with Plan of Care  Patient       Patient will benefit from skilled therapeutic intervention in order to improve the following deficits and impairments:  Decreased endurance, Decreased mobility, Hypomobility, Decreased range of motion, Impaired perceived functional ability, Obesity, Improper body mechanics, Decreased activity tolerance, Decreased strength, Impaired flexibility, Impaired UE functional use, Pain, Postural dysfunction  Visit Diagnosis: Chronic left shoulder pain  Cervicalgia  Muscle weakness (generalized)     Problem List Patient Active Problem List   Diagnosis Date Noted  . Herpes zoster 05/10/2018  . Urinary frequency 05/10/2018  . Trichomonas infection 05/10/2018  . Abnormal chest CT 02/07/2016  . Health care maintenance 01/25/2015  . Dysuria 07/19/2014  . BMI 32.0-32.9,adult 07/19/2014  . Hemorrhoids 11/30/2013  . Abnormal Pap smear of cervix 11/30/2013  . Chest tightness 11/30/2013  . Headache 11/26/2013  . Hypercholesterolemia 11/26/2013  . Blood pressure elevated without history of HTN 11/26/2013    Blythe Stanford, PT DPT 10/29/2018, 10:55 AM  [facility] [address], [place], [address] Phone: [phone]   Fax:  [phone]  Name: Alison Rodriguez MRN: [id number] Date of Birth: [birth year]

## 2018-10-31 ENCOUNTER — Ambulatory Visit: Payer: 59

## 2018-10-31 DIAGNOSIS — M25512 Pain in left shoulder: Principal | ICD-10-CM

## 2018-10-31 DIAGNOSIS — G8929 Other chronic pain: Secondary | ICD-10-CM

## 2018-10-31 DIAGNOSIS — M542 Cervicalgia: Secondary | ICD-10-CM

## 2018-10-31 DIAGNOSIS — M6281 Muscle weakness (generalized): Secondary | ICD-10-CM

## 2018-10-31 NOTE — Therapy (Signed)
[facility] [address] [place], [address] Phone: [phone]   Fax:  [phone]  Physical Therapy Treatment  Patient Details  Name: Alison Rodriguez MRN: [id number] Date of Birth: 1961-10-02 Referring Provider (PT): Einar Pheasant, MD   Encounter Date: 10/31/2018  PT End of Session - 10/31/18 0830    Visit Number  6    Number of Visits  13    Date for PT Re-Evaluation  11/19/18    Authorization Type  [employer]    Authorization Time Period  Current cert period: 32/99/2426 - 11/19/2018 (last PN: IE 10/08/2018)    Authorization - Visit Number  6    Authorization - Number of Visits  10    PT Start Time  0815    PT Stop Time  0900    PT Time Calculation (min)  45 min    Activity Tolerance  Patient tolerated treatment well    Behavior During Therapy  [facility] for tasks assessed/performed       Past Medical History:  Diagnosis Date  . Blood in stool    H/O  . Hyperlipidemia   . Hypertension   . Thyroid disease     Past Surgical History:  Procedure Laterality Date  . UTERINE FIBROID EMBOLIZATION      There were no vitals filed for this visit.  Subjective Assessment - 10/31/18 0825    Subjective  Patient reports no increase in symptoms after the previous session and states her shoulder has not been feeling bad.     Pertinent History  Patient is a 57 y.o. female who presents to outpatient physical therapy with a referral for left shoulder pain. This patient's chief complaints consist of pain, leading to the following functional deficits: difficulty with ADLs, IADLs, dressing, reaching, working out. Relevant past medical history and comorbidities include obesity, headaches Imaging: recent radiographs in physician's office, pt states it showed osteoarthritis.     Diagnostic tests  recent radiographs in physician's office, pt states it showed osteoarthritis.    Patient Stated Goals  return to PLOF without pain.  Start going to Yoga Jan 1    Currently in Pain?  No/denies    Pain Onset  More than a month ago       TREATMENT Therapeutic Exercise Straight arm push downs with 15# at [place] - x 20, x20 20# Shoulder ER in standing - x 20 at wall [place] stretch in corner - 2 x 20 ER with RTB around wrists facing the wall - x 20 Shoulder scaption overhead - x 20 3# Push-ups on table - x 20 TRX scapular retraction - x 20  Pull ups on total gym level - x 10   Patient reports increased fatigue at the end of the session   PT Education - 10/31/18 0830    Education Details  form/technique with exercise    Person(s) Educated  Patient    Methods  Explanation;Demonstration    Comprehension  Verbalized understanding;Returned demonstration       PT Short Term Goals - 10/08/18 1333      PT SHORT TERM GOAL #1   Title  Be independent with home exercise program completed at least 3 times per week for self-management of symptoms.    Baseline  Initial HEP provided at initial eval (10/08/2018)    Time  2    Period  Weeks    Status  New    Target Date  10/22/18  PT Long Term Goals - 10/08/18 1334      PT LONG TERM GOAL #1   Title  Be independent with a long-term home exercise program for self-management of symptoms.     Baseline  Initial HEP provided at initial eval (10/08/2018);    Time  6    Period  Weeks    Status  New    Target Date  11/19/18      PT LONG TERM GOAL #2   Title  Reduce pain with functional activities to equal or less than 1/10 to allow patient to complete usual activities including ADLs, IADLs, and social engagement with less difficulty.     Baseline  during functional activity 7/10 (10/08/2018);    Time  6    Period  Weeks    Status  New    Target Date  11/19/18      PT LONG TERM GOAL #3   Title  Patient will demonstrate improved ability to perform functional tasks as exhibited by at least 10 point improvement in FOTO score    Baseline  55 (10/08/2018);    Time  6     Period  Weeks    Status  New    Target Date  11/19/18      PT LONG TERM GOAL #4   Title  Complete community, work and/or recreational activities without limitation due to current condition.     Baseline  difficulty dressing (putting on jacket/clothing, reaching is difficult, wakes her at night, driving/trying to put on seatbelt, turning quickly to the left, difficulty looking left, unable to complete usual workout routing (10/08/2018);    Time  6    Period  Weeks    Status  New    Target Date  11/19/18      PT LONG TERM GOAL #5   Title  Have full cervical spine and left shoulder AROM with no increase in pain in all planes except intermittent end range discomfort to allow patient to complete valued activities with less difficulty.     Baseline  see objective exam: limits in cervical spine and left shoulder AROM with pain (10/08/2018);    Time  6    Period  Weeks    Status  New    Target Date  11/19/18            Plan - 10/31/18 9833    Clinical Impression Statement  Continued to address decreased ER in and shoulder extension with exercise as patient demosntrates difficulty with performing overhead exercises. Patient demonstrates improvement with ER compared to the previous session. Patient demosntrates increased fatigue at the end of the session and will benefit from further skilled therapy.     Rehab Potential  Good    Clinical Impairments Affecting Rehab Potential  (+) desire to get better, prior level of activity; (-) concern for appearance.     PT Frequency  2x / week    PT Duration  6 weeks    PT Treatment/Interventions  ADLs/Self Care Home Management;Electrical Stimulation;Cryotherapy;Moist Heat;Functional mobility training;Therapeutic activities;Therapeutic exercise;Neuromuscular re-education;Patient/family education;Manual techniques;Passive range of motion;Dry needling;Taping;Joint Manipulations;Spinal Manipulations;Other (comment)   joint mobilizations grades I-V   PT Next  Visit Plan  progress strengthening and specific exercises as appropriate. continue to educate on proper form and provide interventions that encourage self-efficacy.     PT Home Exercise Plan  [employer] Access Code: [email or web address]     Consulted and Agree with Plan of Care  Patient  Patient will benefit from skilled therapeutic intervention in order to improve the following deficits and impairments:  Decreased endurance, Decreased mobility, Hypomobility, Decreased range of motion, Impaired perceived functional ability, Obesity, Improper body mechanics, Decreased activity tolerance, Decreased strength, Impaired flexibility, Impaired UE functional use, Pain, Postural dysfunction  Visit Diagnosis: Chronic left shoulder pain  Cervicalgia  Muscle weakness (generalized)     Problem List Patient Active Problem List   Diagnosis Date Noted  . Herpes zoster 05/10/2018  . Urinary frequency 05/10/2018  . Trichomonas infection 05/10/2018  . Abnormal chest CT 02/07/2016  . Health care maintenance 01/25/2015  . Dysuria 07/19/2014  . BMI 32.0-32.9,adult 07/19/2014  . Hemorrhoids 11/30/2013  . Abnormal Pap smear of cervix 11/30/2013  . Chest tightness 11/30/2013  . Headache 11/26/2013  . Hypercholesterolemia 11/26/2013  . Blood pressure elevated without history of HTN 11/26/2013    Blythe Stanford, PT DPT 10/31/2018, 8:53 AM  [facility] [address], [place], [address] Phone: [phone]   Fax:  [phone]  Name: Alison Rodriguez MRN: [id number] Date of Birth: 26-Jan-1961

## 2018-11-05 ENCOUNTER — Ambulatory Visit: Payer: 59

## 2018-11-07 ENCOUNTER — Ambulatory Visit: Payer: 59

## 2018-11-07 DIAGNOSIS — M6281 Muscle weakness (generalized): Secondary | ICD-10-CM

## 2018-11-07 DIAGNOSIS — G8929 Other chronic pain: Secondary | ICD-10-CM

## 2018-11-07 DIAGNOSIS — M25512 Pain in left shoulder: Secondary | ICD-10-CM | POA: Diagnosis not present

## 2018-11-07 DIAGNOSIS — M542 Cervicalgia: Secondary | ICD-10-CM

## 2018-11-07 NOTE — Therapy (Signed)
[facility] [address] [place], [address] Phone: [phone]   Fax:  [phone]  Physical Therapy Treatment  Patient Details  Name: Alison Rodriguez MRN: [id number] Date of Birth: 07/06/1961 Referring Provider (PT): Einar Pheasant, MD   Encounter Date: 11/07/2018  PT End of Session - 11/07/18 0915    Visit Number  7    Number of Visits  13    Date for PT Re-Evaluation  11/19/18    Authorization Type  [employer]    Authorization Time Period  Current cert period: 94/70/9628 - 11/19/2018 (last PN: IE 10/08/2018)    Authorization - Visit Number  7    Authorization - Number of Visits  10    PT Start Time  0815    PT Stop Time  0900    PT Time Calculation (min)  45 min    Activity Tolerance  Patient tolerated treatment well    Behavior During Therapy  [facility] for tasks assessed/performed       Past Medical History:  Diagnosis Date  . Blood in stool    H/O  . Hyperlipidemia   . Hypertension   . Thyroid disease     Past Surgical History:  Procedure Laterality Date  . UTERINE FIBROID EMBOLIZATION      There were no vitals filed for this visit.  Subjective Assessment - 11/07/18 0821    Subjective  Patient reports that she feels she is doing "better" with her ther ex this week as compared to last week.    Pertinent History  Patient is a 57 y.o. female who presents to outpatient physical therapy with a referral for left shoulder pain. This patient's chief complaints consist of pain, leading to the following functional deficits: difficulty with ADLs, IADLs, dressing, reaching, working out. Relevant past medical history and comorbidities include obesity, headaches Imaging: recent radiographs in physician's office, pt states it showed osteoarthritis.     Diagnostic tests  recent radiographs in physician's office, pt states it showed osteoarthritis.    Patient Stated Goals  return to PLOF without pain. Start going to  Yoga Jan 1    Pain Onset  More than a month ago         Patient reports improvement in shoulder pain since last visit.  Therapeutic Exercise  Standing  Shoulder IR/ER with shoulders abducted to 90 degrees  x20 Shoulder abduction/slides up wall to 90 degrees x20 Shoulder ER/horozontal abduction with blue theraband with forearms on wall x20 with manual cues for increased range Shoulder flexion pushing into yellow physioball against wall x20 Straight arm pushdowns 20 lbs  Overhead ball taps with 2Kg ball 30 sec Overhead circular motion alternating directions with 2 Kg ball 20 sec Archery with blue theraband pull/push x20 Seated high rows 25 lbs x20 Horizontal shoulder rows 25 lbs x20 Body blade lateral motion with arm adducted against towel 30  Body blade a-p motion with shoulder abducted to 160 degrees   Patient responded well to all there ex, reporting a max of /10 pain.  She requires min VC's for correction of form.  Pt will continue to benefit from continued skilled PT to focus on shoulder strength, ROM     PT Education - 11/07/18 0859    Education Details  exercise form/technique    Person(s) Educated  Patient    Methods  Explanation;Demonstration    Comprehension  Verbalized understanding;Returned demonstration       PT Short Term Goals -  10/08/18 1333      PT SHORT TERM GOAL #1   Title  Be independent with home exercise program completed at least 3 times per week for self-management of symptoms.    Baseline  Initial HEP provided at initial eval (10/08/2018)    Time  2    Period  Weeks    Status  New    Target Date  10/22/18        PT Long Term Goals - 10/08/18 1334      PT LONG TERM GOAL #1   Title  Be independent with a long-term home exercise program for self-management of symptoms.     Baseline  Initial HEP provided at initial eval (10/08/2018);    Time  6    Period  Weeks    Status  New    Target Date  11/19/18      PT LONG TERM GOAL #2   Title   Reduce pain with functional activities to equal or less than 1/10 to allow patient to complete usual activities including ADLs, IADLs, and social engagement with less difficulty.     Baseline  during functional activity 7/10 (10/08/2018);    Time  6    Period  Weeks    Status  New    Target Date  11/19/18      PT LONG TERM GOAL #3   Title  Patient will demonstrate improved ability to perform functional tasks as exhibited by at least 10 point improvement in FOTO score    Baseline  55 (10/08/2018);    Time  6    Period  Weeks    Status  New    Target Date  11/19/18      PT LONG TERM GOAL #4   Title  Complete community, work and/or recreational activities without limitation due to current condition.     Baseline  difficulty dressing (putting on jacket/clothing, reaching is difficult, wakes her at night, driving/trying to put on seatbelt, turning quickly to the left, difficulty looking left, unable to complete usual workout routing (10/08/2018);    Time  6    Period  Weeks    Status  New    Target Date  11/19/18      PT LONG TERM GOAL #5   Title  Have full cervical spine and left shoulder AROM with no increase in pain in all planes except intermittent end range discomfort to allow patient to complete valued activities with less difficulty.     Baseline  see objective exam: limits in cervical spine and left shoulder AROM with pain (10/08/2018);    Time  6    Period  Weeks    Status  New    Target Date  11/19/18            Plan - 11/07/18 0945    Clinical Impression Statement  Patient demonstrates improvement with ability to perform higher level exercises compared to the previous session indicating functional carryover between sessions. Although patient is improving, she continues to have limited end range shoulder flexion and will benefit from furhter skilled therapy to return to prior level of function.     Rehab Potential  Good    Clinical Impairments Affecting Rehab Potential   (+) desire to get better, prior level of activity; (-) concern for appearance.     PT Frequency  2x / week    PT Duration  6 weeks    PT Treatment/Interventions  ADLs/Self Care Home Management;Electrical Stimulation;Cryotherapy;Moist  Heat;Functional mobility training;Therapeutic activities;Therapeutic exercise;Neuromuscular re-education;Patient/family education;Manual techniques;Passive range of motion;Dry needling;Taping;Joint Manipulations;Spinal Manipulations;Other (comment)   joint mobilizations grades I-V   PT Next Visit Plan  progress strengthening and specific exercises as appropriate. continue to educate on proper form and provide interventions that encourage self-efficacy.     PT Home Exercise Plan  [employer] Access Code: [email or web address]     Consulted and Agree with Plan of Care  Patient       Patient will benefit from skilled therapeutic intervention in order to improve the following deficits and impairments:  Decreased endurance, Decreased mobility, Hypomobility, Decreased range of motion, Impaired perceived functional ability, Obesity, Improper body mechanics, Decreased activity tolerance, Decreased strength, Impaired flexibility, Impaired UE functional use, Pain, Postural dysfunction  Visit Diagnosis: Chronic left shoulder pain  Cervicalgia  Muscle weakness (generalized)     Problem List Patient Active Problem List   Diagnosis Date Noted  . Herpes zoster 05/10/2018  . Urinary frequency 05/10/2018  . Trichomonas infection 05/10/2018  . Abnormal chest CT 02/07/2016  . Health care maintenance 01/25/2015  . Dysuria 07/19/2014  . BMI 32.0-32.9,adult 07/19/2014  . Hemorrhoids 11/30/2013  . Abnormal Pap smear of cervix 11/30/2013  . Chest tightness 11/30/2013  . Headache 11/26/2013  . Hypercholesterolemia 11/26/2013  . Blood pressure elevated without history of HTN 11/26/2013    Blythe Stanford, PT DPT 11/07/2018, 9:48 AM  [place] [facility] [address] [place], [address] Phone: [phone]   Fax:  [phone]  Name: Alison Rodriguez MRN: [id number] Date of Birth: 09/03/1961

## 2018-11-12 ENCOUNTER — Ambulatory Visit: Payer: 59 | Admitting: Physical Therapy

## 2018-11-12 DIAGNOSIS — M25512 Pain in left shoulder: Secondary | ICD-10-CM | POA: Diagnosis not present

## 2018-11-12 DIAGNOSIS — M6281 Muscle weakness (generalized): Secondary | ICD-10-CM

## 2018-11-12 DIAGNOSIS — M542 Cervicalgia: Secondary | ICD-10-CM

## 2018-11-12 DIAGNOSIS — G8929 Other chronic pain: Secondary | ICD-10-CM

## 2018-11-12 NOTE — Therapy (Signed)
[facility] [address], [place], [address] Phone: [phone]   Fax:  [phone]  Physical Therapy Treatment  Patient Details  Name: Alison Rodriguez MRN: [id number] Date of Birth: 1961/08/30 Referring Provider (PT): Einar Pheasant, MD   Encounter Date: 11/12/2018  PT End of Session - 11/12/18 0826    Visit Number  8    Number of Visits  13    Date for PT Re-Evaluation  11/19/18    Authorization Type  [employer]    Authorization Time Period  Current cert period: 67/10/4579 - 11/19/2018 (last PN: IE 10/08/2018)    Authorization - Visit Number  8    Authorization - Number of Visits  10    PT Start Time  0815    PT Stop Time  9983    PT Time Calculation (min)  40 min    Activity Tolerance  Patient tolerated treatment well    Behavior During Therapy  [facility] for tasks assessed/performed       Past Medical History:  Diagnosis Date  . Blood in stool    H/O  . Hyperlipidemia   . Hypertension   . Thyroid disease     Past Surgical History:  Procedure Laterality Date  . UTERINE FIBROID EMBOLIZATION      There were no vitals filed for this visit.  Subjective Assessment - 11/12/18 0821    Subjective  Patient states that she continues to feel better, but is still not happy with her commitment to her HEP.  She is excited to start a yoga program in the new year, and is open to trying some yoga today with our session.    Pertinent History  Patient is a 57 y.o. female who presents to outpatient physical therapy with a referral for left shoulder pain. This patient's chief complaints consist of pain, leading to the following functional deficits: difficulty with ADLs, IADLs, dressing, reaching, working out. Relevant past medical history and comorbidities include obesity, headaches Imaging: recent radiographs in physician's office, pt states it showed osteoarthritis.     Diagnostic tests  recent radiographs in physician's  office, pt states it showed osteoarthritis.    Patient Stated Goals  return to PLOF without pain. Start going to Yoga Jan 1    Currently in Pain?  Yes    Pain Score  2     Pain Location  Shoulder    Pain Orientation  Left    Pain Descriptors / Indicators  Aching    Pain Onset  More than a month ago    Pain Frequency  Intermittent      TREATMENT  Therapeutic Exercise UBE x5 min (2.5 fwd/bwd) Yoga sequencing x20 min LUE Overhead ball taps with 2Kg ball 30 sec LUE Overhead circular motion alternating directions with 2 Kg ball 20 secx3 BUE Archery with blue theraband pull/push x20 LUE Body blade lateral motion with arm adducted against towel 30 sec LUE Body blade a-p motion with shoulder abducted to 120 degrees 30 sec    PT Education - 11/12/18 0825    Education Details  exercise technique, yoga activity modification    Person(s) Educated  Patient    Methods  Explanation;Demonstration    Comprehension  Verbalized understanding;Returned demonstration;Need further instruction       PT Short Term Goals - 10/08/18 1333      PT SHORT TERM GOAL #1   Title  Be independent with home exercise program completed at least 3 times  per week for self-management of symptoms.    Baseline  Initial HEP provided at initial eval (10/08/2018)    Time  2    Period  Weeks    Status  New    Target Date  10/22/18        PT Long Term Goals - 10/08/18 1334      PT LONG TERM GOAL #1   Title  Be independent with a long-term home exercise program for self-management of symptoms.     Baseline  Initial HEP provided at initial eval (10/08/2018);    Time  6    Period  Weeks    Status  New    Target Date  11/19/18      PT LONG TERM GOAL #2   Title  Reduce pain with functional activities to equal or less than 1/10 to allow patient to complete usual activities including ADLs, IADLs, and social engagement with less difficulty.     Baseline  during functional activity 7/10 (10/08/2018);    Time  6     Period  Weeks    Status  New    Target Date  11/19/18      PT LONG TERM GOAL #3   Title  Patient will demonstrate improved ability to perform functional tasks as exhibited by at least 10 point improvement in FOTO score    Baseline  55 (10/08/2018);    Time  6    Period  Weeks    Status  New    Target Date  11/19/18      PT LONG TERM GOAL #4   Title  Complete community, work and/or recreational activities without limitation due to current condition.     Baseline  difficulty dressing (putting on jacket/clothing, reaching is difficult, wakes her at night, driving/trying to put on seatbelt, turning quickly to the left, difficulty looking left, unable to complete usual workout routing (10/08/2018);    Time  6    Period  Weeks    Status  New    Target Date  11/19/18      PT LONG TERM GOAL #5   Title  Have full cervical spine and left shoulder AROM with no increase in pain in all planes except intermittent end range discomfort to allow patient to complete valued activities with less difficulty.     Baseline  see objective exam: limits in cervical spine and left shoulder AROM with pain (10/08/2018);    Time  6    Period  Weeks    Status  New    Target Date  11/19/18            Plan - 11/12/18 1026    Clinical Impression Statement  Patient demonstrates continued improvement in LUE strength and tolerance to increased demand with deficits remaining in coordination and motor control still present at end range L shoulder flexion. Patient will continue to benefit from skilled therapeutic intervention to address remaining deficits in order to return to PLOF and transition to independent maintenance.     Rehab Potential  Good    Clinical Impairments Affecting Rehab Potential  (+) desire to get better, prior level of activity; (-) concern for appearance.     PT Frequency  2x / week    PT Duration  6 weeks    PT Treatment/Interventions  ADLs/Self Care Home Management;Electrical  Stimulation;Cryotherapy;Moist Heat;Functional mobility training;Therapeutic activities;Therapeutic exercise;Neuromuscular re-education;Patient/family education;Manual techniques;Passive range of motion;Dry needling;Taping;Joint Manipulations;Spinal Manipulations;Other (comment)   joint mobilizations grades I-V  PT Next Visit Plan  progress strengthening and specific exercises as appropriate. continue to educate on proper form and provide interventions that encourage self-efficacy.     PT Home Exercise Plan  [employer] Access Code: [email or web address]     Consulted and Agree with Plan of Care  Patient       Patient will benefit from skilled therapeutic intervention in order to improve the following deficits and impairments:  Decreased endurance, Decreased mobility, Hypomobility, Decreased range of motion, Impaired perceived functional ability, Obesity, Improper body mechanics, Decreased activity tolerance, Decreased strength, Impaired flexibility, Impaired UE functional use, Pain, Postural dysfunction  Visit Diagnosis: Chronic left shoulder pain  Cervicalgia  Muscle weakness (generalized)     Problem List Patient Active Problem List   Diagnosis Date Noted  . Herpes zoster 05/10/2018  . Urinary frequency 05/10/2018  . Trichomonas infection 05/10/2018  . Abnormal chest CT 02/07/2016  . Health care maintenance 01/25/2015  . Dysuria 07/19/2014  . BMI 32.0-32.9,adult 07/19/2014  . Hemorrhoids 11/30/2013  . Abnormal Pap smear of cervix 11/30/2013  . Chest tightness 11/30/2013  . Headache 11/26/2013  . Hypercholesterolemia 11/26/2013  . Blood pressure elevated without history of HTN 11/26/2013   Myles Gip PT, DPT [phone] 11/12/2018, 10:34 AM  [place] [facility] [address], [place], [address] Phone: [phone]   Fax:  [phone]  Name: Alison Rodriguez MRN: [id number] Date of Birth: May 24, 1961

## 2018-11-19 ENCOUNTER — Ambulatory Visit: Payer: 59

## 2018-11-19 DIAGNOSIS — M542 Cervicalgia: Secondary | ICD-10-CM

## 2018-11-19 DIAGNOSIS — M6281 Muscle weakness (generalized): Secondary | ICD-10-CM

## 2018-11-19 DIAGNOSIS — M25512 Pain in left shoulder: Secondary | ICD-10-CM | POA: Diagnosis not present

## 2018-11-19 DIAGNOSIS — G8929 Other chronic pain: Secondary | ICD-10-CM

## 2018-11-19 NOTE — Therapy (Signed)
[facility] [address] [place], [address] Phone: [phone]   Fax:  [phone]  Physical Therapy Treatment  Patient Details  Name: Alison Rodriguez MRN: [id number] Date of Birth: [birth year] Referring Provider (PT): Einar Pheasant, MD   Encounter Date: 11/19/2018  PT End of Session - 11/19/18 0842    Visit Number  9    Number of Visits  13    Date for PT Re-Evaluation  11/19/18    Authorization Type  [employer]    Authorization Time Period  Current cert period: 95/63/8756 - 11/19/2018 (last PN: IE 10/08/2018)    Authorization - Visit Number  9    Authorization - Number of Visits  10    PT Start Time  0815    PT Stop Time  0900    PT Time Calculation (min)  45 min    Activity Tolerance  Patient tolerated treatment well    Behavior During Therapy  [facility] for tasks assessed/performed       Past Medical History:  Diagnosis Date  . Blood in stool    H/O  . Hyperlipidemia   . Hypertension   . Thyroid disease     Past Surgical History:  Procedure Laterality Date  . UTERINE FIBROID EMBOLIZATION      There were no vitals filed for this visit.  Subjective Assessment - 11/19/18 0832    Subjective  Patient states improvement overall but reports she "isn't quite there yet." Patient states she needs to starts exercising.     Pertinent History  Patient is a 57 y.o. female who presents to outpatient physical therapy with a referral for left shoulder pain. This patient's chief complaints consist of pain, leading to the following functional deficits: difficulty with ADLs, IADLs, dressing, reaching, working out. Relevant past medical history and comorbidities include obesity, headaches Imaging: recent radiographs in physician's office, pt states it showed osteoarthritis.     Diagnostic tests  recent radiographs in physician's office, pt states it showed osteoarthritis.    Patient Stated Goals  return to PLOF without  pain. Start going to Yoga Jan 1    Currently in Pain?  No/denies    Pain Onset  More than a month ago       Therapeutic Exercise   Standing Shoulder straight arm push down at [place] 20# -- x 25 Standing rows at [place] 20# -- x 25 Seated high rows 25# -- x20 Lat pull down 35# -- x 25 Shoulder flexion YTB around wrists for shoulder ER - 2 x 20 Shoulder ER with GTB at the wall - 3 x 15 Shoulder IR in standing with RTB - x 20 shoulder ab Shoulder ER in standing with GTB - x 20 shoulder abducted to 90 Shoulder extension with GTB - x20 Shoulder Serratus punches with GTB - x 20  Shoulder IR/ER with body blade - 4 x 20sec   Patient demonstrates increased fatigue at the end of the session   PT Education - 11/19/18 0841    Education Details  form/technique with exercise    Person(s) Educated  Patient    Methods  Explanation;Demonstration    Comprehension  Verbalized understanding;Returned demonstration       PT Short Term Goals - 10/08/18 1333      PT SHORT TERM GOAL #1   Title  Be independent with home exercise program completed at least 3 times per week for self-management of symptoms.    Baseline  Initial  HEP provided at initial eval (10/08/2018)    Time  2    Period  Weeks    Status  New    Target Date  10/22/18        PT Long Term Goals - 10/08/18 1334      PT LONG TERM GOAL #1   Title  Be independent with a long-term home exercise program for self-management of symptoms.     Baseline  Initial HEP provided at initial eval (10/08/2018);    Time  6    Period  Weeks    Status  New    Target Date  11/19/18      PT LONG TERM GOAL #2   Title  Reduce pain with functional activities to equal or less than 1/10 to allow patient to complete usual activities including ADLs, IADLs, and social engagement with less difficulty.     Baseline  during functional activity 7/10 (10/08/2018);    Time  6    Period  Weeks    Status  New    Target Date  11/19/18      PT LONG TERM GOAL  #3   Title  Patient will demonstrate improved ability to perform functional tasks as exhibited by at least 10 point improvement in FOTO score    Baseline  55 (10/08/2018);    Time  6    Period  Weeks    Status  New    Target Date  11/19/18      PT LONG TERM GOAL #4   Title  Complete community, work and/or recreational activities without limitation due to current condition.     Baseline  difficulty dressing (putting on jacket/clothing, reaching is difficult, wakes her at night, driving/trying to put on seatbelt, turning quickly to the left, difficulty looking left, unable to complete usual workout routing (10/08/2018);    Time  6    Period  Weeks    Status  New    Target Date  11/19/18      PT LONG TERM GOAL #5   Title  Have full cervical spine and left shoulder AROM with no increase in pain in all planes except intermittent end range discomfort to allow patient to complete valued activities with less difficulty.     Baseline  see objective exam: limits in cervical spine and left shoulder AROM with pain (10/08/2018);    Time  6    Period  Weeks    Status  New    Target Date  11/19/18            Plan - 11/19/18 0845    Clinical Impression Statement  Patient demonstrates improvement with ability to perform greater amount of resistance compared to previous sessions indicating functional carryover between sessions. Patient continues to demonstrate poor muscular endurance with activity and will benefit from further skilled therapy to return to prior level of function.    Rehab Potential  Good    Clinical Impairments Affecting Rehab Potential  (+) desire to get better, prior level of activity; (-) concern for appearance.     PT Frequency  2x / week    PT Duration  6 weeks    PT Treatment/Interventions  ADLs/Self Care Home Management;Electrical Stimulation;Cryotherapy;Moist Heat;Functional mobility training;Therapeutic activities;Therapeutic exercise;Neuromuscular  re-education;Patient/family education;Manual techniques;Passive range of motion;Dry needling;Taping;Joint Manipulations;Spinal Manipulations;Other (comment)   joint mobilizations grades I-V   PT Next Visit Plan  progress strengthening and specific exercises as appropriate. continue to educate on proper form and provide interventions  that encourage self-efficacy.     PT Home Exercise Plan  [employer] Access Code: [email or web address]     Consulted and Agree with Plan of Care  Patient       Patient will benefit from skilled therapeutic intervention in order to improve the following deficits and impairments:  Decreased endurance, Decreased mobility, Hypomobility, Decreased range of motion, Impaired perceived functional ability, Obesity, Improper body mechanics, Decreased activity tolerance, Decreased strength, Impaired flexibility, Impaired UE functional use, Pain, Postural dysfunction  Visit Diagnosis: Cervicalgia  Chronic left shoulder pain  Muscle weakness (generalized)     Problem List Patient Active Problem List   Diagnosis Date Noted  . Herpes zoster 05/10/2018  . Urinary frequency 05/10/2018  . Trichomonas infection 05/10/2018  . Abnormal chest CT 02/07/2016  . Health care maintenance 01/25/2015  . Dysuria 07/19/2014  . BMI 32.0-32.9,adult 07/19/2014  . Hemorrhoids 11/30/2013  . Abnormal Pap smear of cervix 11/30/2013  . Chest tightness 11/30/2013  . Headache 11/26/2013  . Hypercholesterolemia 11/26/2013  . Blood pressure elevated without history of HTN 11/26/2013    Blythe Stanford, PT DPT 11/19/2018, 9:02 AM  [place] [facility] [address] [place], [address] Phone: [phone]   Fax:  [phone]  Name: Alison Rodriguez MRN: [id number] Date of Birth: 1961-03-19

## 2018-11-21 ENCOUNTER — Ambulatory Visit: Payer: 59 | Attending: Internal Medicine

## 2018-11-21 DIAGNOSIS — M25512 Pain in left shoulder: Secondary | ICD-10-CM | POA: Diagnosis present

## 2018-11-21 DIAGNOSIS — G8929 Other chronic pain: Secondary | ICD-10-CM

## 2018-11-21 DIAGNOSIS — M6281 Muscle weakness (generalized): Secondary | ICD-10-CM

## 2018-11-21 DIAGNOSIS — M542 Cervicalgia: Secondary | ICD-10-CM | POA: Diagnosis not present

## 2018-11-21 NOTE — Addendum Note (Signed)
Addended by: Blain Pais on: 11/21/2018 08:23 AM   Modules accepted: Orders

## 2018-11-21 NOTE — Therapy (Signed)
[facility] [address], [place], [address] Phone: [phone]   Fax:  [phone]  Physical Therapy Treatment  Patient Details  Name: Alison Rodriguez MRN: [id number] Date of Birth: 09-Dec-1960 Referring Provider (PT): Einar Pheasant, MD   Encounter Date: 11/21/2018  PT End of Session - 11/21/18 0812    Visit Number  10    Number of Visits  21    Date for PT Re-Evaluation  12/19/18    Authorization Type  [employer]    Authorization Time Period  Current cert period: 01/65/5374 - 11/19/2018 (last PN: IE 10/08/2018)    Authorization - Visit Number  10    Authorization - Number of Visits  10    PT Start Time  0730    PT Stop Time  0815    PT Time Calculation (min)  45 min    Activity Tolerance  Patient tolerated treatment well    Behavior During Therapy  [facility] for tasks assessed/performed       Past Medical History:  Diagnosis Date  . Blood in stool    H/O  . Hyperlipidemia   . Hypertension   . Thyroid disease     Past Surgical History:  Procedure Laterality Date  . UTERINE FIBROID EMBOLIZATION      There were no vitals filed for this visit.  Subjective Assessment - 11/21/18 0741    Subjective  Patient reports no major changes sincee the previous session. Patient states she continues to see improvement.     Pertinent History  Patient is a 58 y.o. female who presents to outpatient physical therapy with a referral for left shoulder pain. This patient's chief complaints consist of pain, leading to the following functional deficits: difficulty with ADLs, IADLs, dressing, reaching, working out. Relevant past medical history and comorbidities include obesity, headaches Imaging: recent radiographs in physician's office, pt states it showed osteoarthritis.     Diagnostic tests  recent radiographs in physician's office, pt states it showed osteoarthritis.    Patient Stated Goals  return to PLOF without pain. Start  going to Yoga Jan 1    Currently in Pain?  No/denies    Pain Onset  More than a month ago         Therapeutic Exercise   Standing Shoulder straight arm push down at [facility] 25# -- 2 x 25 Standing rows at [facility] 25# -- 2 x 25 Standing shoulder abduction with yellow physioball - 2 x 40 B Shoulder ER with RTB at the wall - 2 x 15 Shoulder IR with OMEGA 10# - 2 x 15  Shoulder serratus punches with GTB - 2 x 20  Passes behind the back with 2# -- 2 x 20  Shoulder raises behind the back with 2# -- 2 x20    Patient demonstrates increased fatigue at the end of the session    PT Education - 11/21/18 0750    Education Details  form/technique with exercise    Person(s) Educated  Patient    Methods  Explanation;Demonstration    Comprehension  Verbalized understanding;Returned demonstration       PT Short Term Goals - 10/08/18 1333      PT SHORT TERM GOAL #1   Title  Be independent with home exercise program completed at least 3 times per week for self-management of symptoms.    Baseline  Initial HEP provided at initial eval (10/08/2018)    Time  2    Period  Weeks    Status  New    Target Date  10/22/18        PT Long Term Goals - 11/21/18 0755      PT LONG TERM GOAL #1   Title  Be independent with a long-term home exercise program for self-management of symptoms.     Baseline  Initial HEP provided at initial eval (10/08/2018); 11/21/2017: moderate cueing for completion    Time  6    Period  Weeks    Status  On-going      PT LONG TERM GOAL #2   Title  Reduce pain with functional activities to equal or less than 1/10 to allow patient to complete usual activities including ADLs, IADLs, and social engagement with less difficulty.     Baseline  during functional activity 7/10 (10/08/2018); 11/21/2017: 3/10    Time  6    Period  Weeks    Status  On-going      PT LONG TERM GOAL #3   Title  Patient will demonstrate improved ability to perform functional tasks as exhibited by at  least 10 point improvement in FOTO score    Baseline  55 (10/08/2018); 11/21/2018: 67    Time  6    Period  Weeks    Status  Achieved      PT LONG TERM GOAL #4   Title  Complete community, work and/or recreational activities without limitation due to current condition.     Baseline  difficulty dressing (putting on jacket/clothing, reaching is difficult, wakes her at night, driving/trying to put on seatbelt, turning quickly to the left, difficulty looking left, unable to complete usual workout routing (10/08/2018); 11/21/2018: Difficulty with doffing and donning jacket    Time  6    Period  Weeks    Status  On-going      PT LONG TERM GOAL #5   Title  Have full cervical spine and left shoulder AROM with no increase in pain in all planes except intermittent end range discomfort to allow patient to complete valued activities with less difficulty.     Baseline  see objective exam: limits in cervical spine and left shoulder AROM with pain (10/08/2018); 11/21/2018: No pain with AROM    Time  6    Period  Weeks    Status  Achieved            Plan - 11/21/18 6948    Clinical Impression Statement  Patient is making improvement overall towards long term goals with less pain with functional activities and greater scores on outcome measures. Although patient is making improvements, she continues to have difficulty with functional shoulder IR and doffing and donning a jacket as well as increased rest pain (worst pain) of 4/10 in the past week. Patient is making improvements but will benefit from further skilled therapy to return to prior level of function.     Rehab Potential  Good    Clinical Impairments Affecting Rehab Potential  (+) desire to get better, prior level of activity; (-) concern for appearance.     PT Frequency  2x / week    PT Duration  6 weeks    PT Treatment/Interventions  ADLs/Self Care Home Management;Electrical Stimulation;Cryotherapy;Moist Heat;Functional mobility  training;Therapeutic activities;Therapeutic exercise;Neuromuscular re-education;Patient/family education;Manual techniques;Passive range of motion;Dry needling;Taping;Joint Manipulations;Spinal Manipulations;Other (comment)   joint mobilizations grades I-V   PT Next Visit Plan  progress strengthening and specific exercises as appropriate. continue to educate on proper form and provide interventions  that encourage self-efficacy.     PT Home Exercise Plan  [employer] Access Code: [email or web address]     Consulted and Agree with Plan of Care  Patient       Patient will benefit from skilled therapeutic intervention in order to improve the following deficits and impairments:  Decreased endurance, Decreased mobility, Hypomobility, Decreased range of motion, Impaired perceived functional ability, Obesity, Improper body mechanics, Decreased activity tolerance, Decreased strength, Impaired flexibility, Impaired UE functional use, Pain, Postural dysfunction  Visit Diagnosis: Cervicalgia  Chronic left shoulder pain  Muscle weakness (generalized)     Problem List Patient Active Problem List   Diagnosis Date Noted  . Herpes zoster 05/10/2018  . Urinary frequency 05/10/2018  . Trichomonas infection 05/10/2018  . Abnormal chest CT 02/07/2016  . Health care maintenance 01/25/2015  . Dysuria 07/19/2014  . BMI 32.0-32.9,adult 07/19/2014  . Hemorrhoids 11/30/2013  . Abnormal Pap smear of cervix 11/30/2013  . Chest tightness 11/30/2013  . Headache 11/26/2013  . Hypercholesterolemia 11/26/2013  . Blood pressure elevated without history of HTN 11/26/2013    Blythe Stanford, PT DPT 11/21/2018, 8:21 AM  [facility] [address], [place], [address] Phone: [phone]   Fax:  [phone]  Name: Alison Rodriguez MRN: [id number] Date of Birth: 03/31/1961

## 2018-11-26 ENCOUNTER — Ambulatory Visit: Payer: 59

## 2018-11-26 DIAGNOSIS — M542 Cervicalgia: Secondary | ICD-10-CM | POA: Diagnosis not present

## 2018-11-26 DIAGNOSIS — G8929 Other chronic pain: Secondary | ICD-10-CM

## 2018-11-26 DIAGNOSIS — M25512 Pain in left shoulder: Secondary | ICD-10-CM

## 2018-11-26 DIAGNOSIS — M6281 Muscle weakness (generalized): Secondary | ICD-10-CM

## 2018-11-26 NOTE — Therapy (Signed)
[facility] [address] [place], [address] Phone: [phone]   Fax:  [phone]  Physical Therapy Treatment  Patient Details  Name: Alison Rodriguez MRN: [id number] Date of Birth: 06-12-1961 Referring Provider (PT): Einar Pheasant, MD   Encounter Date: 11/26/2018  PT End of Session - 11/26/18 0842    Visit Number  11    Number of Visits  21    Date for PT Re-Evaluation  12/19/18    Authorization Type  [employer]HC    Authorization Time Period  Current cert period: 86/76/1950 - 11/19/2018 (last PN: IE 10/08/2018)    PT Start Time  0815    PT Stop Time  0900    PT Time Calculation (min)  45 min    Activity Tolerance  Patient tolerated treatment well    Behavior During Therapy  [facility] for tasks assessed/performed       Past Medical History:  Diagnosis Date  . Blood in stool    H/O  . Hyperlipidemia   . Hypertension   . Thyroid disease     Past Surgical History:  Procedure Laterality Date  . UTERINE FIBROID EMBOLIZATION      There were no vitals filed for this visit.  Subjective Assessment - 11/26/18 0834    Subjective  Patient reports decreased ability to donn/doff a jacket due to lack of AROM. Patient reports overall her motion has been improving.     Pertinent History  Patient is a 58 y.o. female who presents to outpatient physical therapy with a referral for left shoulder pain. This patient's chief complaints consist of pain, leading to the following functional deficits: difficulty with ADLs, IADLs, dressing, reaching, working out. Relevant past medical history and comorbidities include obesity, headaches Imaging: recent radiographs in physician's office, pt states it showed osteoarthritis.     Diagnostic tests  recent radiographs in physician's office, pt states it showed osteoarthritis.    Patient Stated Goals  return to PLOF without pain. Start going to Yoga Jan 1    Currently in Pain?  No/denies     Pain Onset  More than a month ago       TREATMENT Therapeutic Exercise   Shoulder straight arm push down at [facility] 25# -- 2 x 25 Shoulder raises behind the back with 3# -- 2 x20  Passes behind the back with 3# -- 2 x 20  Shoulder IR behind back with towel - x 25 Shoulder IR behind back with 2 and 4# weight - x 25  Shoulder ER with YTB at the wall - 2 x 15 Standing rows at [facility] 25# -- 2 x 25 Standing shoulder abduction with yellow physioball - 2 x 40 B Shoulder IR with OMEGA 5# -  x 10 Shoulder IR with GTB --  2 x 20   Patient demonstrates increased fatigue at the end of the session    PT Education - 11/26/18 0839    Education Details  form/technique with exercise    Person(s) Educated  Patient    Methods  Explanation;Demonstration    Comprehension  Verbalized understanding;Returned demonstration       PT Short Term Goals - 10/08/18 1333      PT SHORT TERM GOAL #1   Title  Be independent with home exercise program completed at least 3 times per week for self-management of symptoms.    Baseline  Initial HEP provided at initial eval (10/08/2018)    Time  2  Period  Weeks    Status  New    Target Date  10/22/18        PT Long Term Goals - 11/21/18 0755      PT LONG TERM GOAL #1   Title  Be independent with a long-term home exercise program for self-management of symptoms.     Baseline  Initial HEP provided at initial eval (10/08/2018); 11/21/2017: moderate cueing for completion    Time  6    Period  Weeks    Status  On-going      PT LONG TERM GOAL #2   Title  Reduce pain with functional activities to equal or less than 1/10 to allow patient to complete usual activities including ADLs, IADLs, and social engagement with less difficulty.     Baseline  during functional activity 7/10 (10/08/2018); 11/21/2017: 3/10    Time  6    Period  Weeks    Status  On-going      PT LONG TERM GOAL #3   Title  Patient will demonstrate improved ability to perform functional tasks as  exhibited by at least 10 point improvement in FOTO score    Baseline  55 (10/08/2018); 11/21/2018: 67    Time  6    Period  Weeks    Status  Achieved      PT LONG TERM GOAL #4   Title  Complete community, work and/or recreational activities without limitation due to current condition.     Baseline  difficulty dressing (putting on jacket/clothing, reaching is difficult, wakes her at night, driving/trying to put on seatbelt, turning quickly to the left, difficulty looking left, unable to complete usual workout routing (10/08/2018); 11/21/2018: Difficulty with doffing and donning jacket    Time  6    Period  Weeks    Status  On-going      PT LONG TERM GOAL #5   Title  Have full cervical spine and left shoulder AROM with no increase in pain in all planes except intermittent end range discomfort to allow patient to complete valued activities with less difficulty.     Baseline  see objective exam: limits in cervical spine and left shoulder AROM with pain (10/08/2018); 11/21/2018: No pain with AROM    Time  6    Period  Weeks    Status  Achieved            Plan - 11/26/18 4235    Clinical Impression Statement  Patient continues to make improvement with greater ability to perform shoulder IR compard to the previous session indicating functional carryover between sessions. Conitnued to progress exercises in terms of resistance and intensity. Patient will benefit from further skilled therapy to return to prior level of function.    Rehab Potential  Good    Clinical Impairments Affecting Rehab Potential  (+) desire to get better, prior level of activity; (-) concern for appearance.     PT Frequency  2x / week    PT Duration  6 weeks    PT Treatment/Interventions  ADLs/Self Care Home Management;Electrical Stimulation;Cryotherapy;Moist Heat;Functional mobility training;Therapeutic activities;Therapeutic exercise;Neuromuscular re-education;Patient/family education;Manual techniques;Passive range of  motion;Dry needling;Taping;Joint Manipulations;Spinal Manipulations;Other (comment)   joint mobilizations grades I-V   PT Next Visit Plan  progress strengthening and specific exercises as appropriate. continue to educate on proper form and provide interventions that encourage self-efficacy.     PT Home Exercise Plan  [employer] Access Code: [email or web address]     Consulted and Agree with Plan of  Care  Patient       Patient will benefit from skilled therapeutic intervention in order to improve the following deficits and impairments:  Decreased endurance, Decreased mobility, Hypomobility, Decreased range of motion, Impaired perceived functional ability, Obesity, Improper body mechanics, Decreased activity tolerance, Decreased strength, Impaired flexibility, Impaired UE functional use, Pain, Postural dysfunction  Visit Diagnosis: Cervicalgia  Chronic left shoulder pain  Muscle weakness (generalized)     Problem List Patient Active Problem List   Diagnosis Date Noted  . Herpes zoster 05/10/2018  . Urinary frequency 05/10/2018  . Trichomonas infection 05/10/2018  . Abnormal chest CT 02/07/2016  . Health care maintenance 01/25/2015  . Dysuria 07/19/2014  . BMI 32.0-32.9,adult 07/19/2014  . Hemorrhoids 11/30/2013  . Abnormal Pap smear of cervix 11/30/2013  . Chest tightness 11/30/2013  . Headache 11/26/2013  . Hypercholesterolemia 11/26/2013  . Blood pressure elevated without history of HTN 11/26/2013    Blythe Stanford 11/26/2018, 9:01 AM  [facility] [address], [place], [address] Phone: [phone]   Fax:  [phone]  Name: Alison Rodriguez MRN: [id number] Date of Birth: 1961/06/06

## 2018-11-28 ENCOUNTER — Ambulatory Visit: Payer: 59

## 2018-11-28 DIAGNOSIS — M6281 Muscle weakness (generalized): Secondary | ICD-10-CM

## 2018-11-28 DIAGNOSIS — M25512 Pain in left shoulder: Secondary | ICD-10-CM

## 2018-11-28 DIAGNOSIS — G8929 Other chronic pain: Secondary | ICD-10-CM

## 2018-11-28 DIAGNOSIS — M542 Cervicalgia: Secondary | ICD-10-CM | POA: Diagnosis not present

## 2018-11-28 NOTE — Therapy (Signed)
[facility] [address], [place], [address] Phone: [phone]   Fax:  [phone]  Physical Therapy Treatment  Patient Details  Name: Alison Rodriguez MRN: [id number] Date of Birth: 10-19-1961 Referring Provider (PT): Einar Pheasant, MD   Encounter Date: 11/28/2018  PT End of Session - 11/28/18 0831    Visit Number  12    Number of Visits  21    Date for PT Re-Evaluation  12/19/18    Authorization Type  UHC    PT Start Time  0815    PT Stop Time  0900    PT Time Calculation (min)  45 min    Activity Tolerance  Patient tolerated treatment well    Behavior During Therapy  [facility] for tasks assessed/performed       Past Medical History:  Diagnosis Date  . Blood in stool    H/O  . Hyperlipidemia   . Hypertension   . Thyroid disease     Past Surgical History:  Procedure Laterality Date  . UTERINE FIBROID EMBOLIZATION      There were no vitals filed for this visit.  Subjective Assessment - 11/28/18 0827    Subjective  Patient states she had not experienced any increase in pain after the previous session. Patient states she is improving overlal with therapy.     Pertinent History  Patient is a 58 y.o. female who presents to outpatient physical therapy with a referral for left shoulder pain. This patient's chief complaints consist of pain, leading to the following functional deficits: difficulty with ADLs, IADLs, dressing, reaching, working out. Relevant past medical history and comorbidities include obesity, headaches Imaging: recent radiographs in physician's office, pt states it showed osteoarthritis.     Diagnostic tests  recent radiographs in physician's office, pt states it showed osteoarthritis.    Patient Stated Goals  return to PLOF without pain. Start going to Yoga Jan 1    Currently in Pain?  No/denies    Pain Onset  More than a month ago       TREATMENT Therapeutic Exercise   Shoulder IR  behind back with towel - 3 x 25 Shoulder IR behind back with 4# weight - 3 x 25  Standing rows at [facility] 25# -- 2 x 25 Shoulder raises behind the back with 4# -- 2 x20  Passes behind the back with 4# -- 2 x 20  Lat downs in sitting - 2 x 25 35# Shoulder abduction with yellow physioball - 3# 2 x 20 Serratus punches in standing with GTB - 2 x 30  Shoulder ER with GTB at the wall - 2 x 20 Body blade in standing with black body blade -- 2 x 30secs Shoulder IR in standing with gray TB -- x 20    Performed greater amount of IR today to improve ability to donn/doff her jacket   PT Education - 11/28/18 0830    Education Details  form/technique with exercise    Person(s) Educated  Patient    Methods  Explanation;Demonstration    Comprehension  Verbalized understanding;Returned demonstration       PT Short Term Goals - 10/08/18 1333      PT SHORT TERM GOAL #1   Title  Be independent with home exercise program completed at least 3 times per week for self-management of symptoms.    Baseline  Initial HEP provided at initial eval (10/08/2018)    Time  2  Period  Weeks    Status  New    Target Date  10/22/18        PT Long Term Goals - 11/21/18 0755      PT LONG TERM GOAL #1   Title  Be independent with a long-term home exercise program for self-management of symptoms.     Baseline  Initial HEP provided at initial eval (10/08/2018); 11/21/2017: moderate cueing for completion    Time  6    Period  Weeks    Status  On-going      PT LONG TERM GOAL #2   Title  Reduce pain with functional activities to equal or less than 1/10 to allow patient to complete usual activities including ADLs, IADLs, and social engagement with less difficulty.     Baseline  during functional activity 7/10 (10/08/2018); 11/21/2017: 3/10    Time  6    Period  Weeks    Status  On-going      PT LONG TERM GOAL #3   Title  Patient will demonstrate improved ability to perform functional tasks as exhibited by at least  10 point improvement in FOTO score    Baseline  55 (10/08/2018); 11/21/2018: 67    Time  6    Period  Weeks    Status  Achieved      PT LONG TERM GOAL #4   Title  Complete community, work and/or recreational activities without limitation due to current condition.     Baseline  difficulty dressing (putting on jacket/clothing, reaching is difficult, wakes her at night, driving/trying to put on seatbelt, turning quickly to the left, difficulty looking left, unable to complete usual workout routing (10/08/2018); 11/21/2018: Difficulty with doffing and donning jacket    Time  6    Period  Weeks    Status  On-going      PT LONG TERM GOAL #5   Title  Have full cervical spine and left shoulder AROM with no increase in pain in all planes except intermittent end range discomfort to allow patient to complete valued activities with less difficulty.     Baseline  see objective exam: limits in cervical spine and left shoulder AROM with pain (10/08/2018); 11/21/2018: No pain with AROM    Time  6    Period  Weeks    Status  Achieved            Plan - 11/28/18 0836    Clinical Impression Statement  Focused on improving patient's IR today as she continues to have difficulty with reaching behind her back especially to donn/doff her her coat. Patient demosntrates improvement overall with strength and AROM however continues to have difficulties overall. Patient will benefit from further skilled therapy to return to prio rlevel of function.     Rehab Potential  Good    Clinical Impairments Affecting Rehab Potential  (+) desire to get better, prior level of activity; (-) concern for appearance.     PT Frequency  2x / week    PT Duration  6 weeks    PT Treatment/Interventions  ADLs/Self Care Home Management;Electrical Stimulation;Cryotherapy;Moist Heat;Functional mobility training;Therapeutic activities;Therapeutic exercise;Neuromuscular re-education;Patient/family education;Manual techniques;Passive range of  motion;Dry needling;Taping;Joint Manipulations;Spinal Manipulations;Other (comment)   joint mobilizations grades I-V   PT Next Visit Plan  progress strengthening and specific exercises as appropriate. continue to educate on proper form and provide interventions that encourage self-efficacy.     PT Home Exercise Plan  [employer] Access Code: [email or web address]  Consulted and Agree with Plan of Care  Patient       Patient will benefit from skilled therapeutic intervention in order to improve the following deficits and impairments:  Decreased endurance, Decreased mobility, Hypomobility, Decreased range of motion, Impaired perceived functional ability, Obesity, Improper body mechanics, Decreased activity tolerance, Decreased strength, Impaired flexibility, Impaired UE functional use, Pain, Postural dysfunction  Visit Diagnosis: Cervicalgia  Chronic left shoulder pain  Muscle weakness (generalized)     Problem List Patient Active Problem List   Diagnosis Date Noted  . Herpes zoster 05/10/2018  . Urinary frequency 05/10/2018  . Trichomonas infection 05/10/2018  . Abnormal chest CT 02/07/2016  . Health care maintenance 01/25/2015  . Dysuria 07/19/2014  . BMI 32.0-32.9,adult 07/19/2014  . Hemorrhoids 11/30/2013  . Abnormal Pap smear of cervix 11/30/2013  . Chest tightness 11/30/2013  . Headache 11/26/2013  . Hypercholesterolemia 11/26/2013  . Blood pressure elevated without history of HTN 11/26/2013    Blythe Stanford, PT DPT 11/28/2018, 8:43 AM  [facility] [address], [place], [address] Phone: [phone]   Fax:  [phone]  Name: Zaire Vanbuskirk MRN: [id number] Date of Birth: Apr 10, 1961

## 2018-12-03 ENCOUNTER — Ambulatory Visit: Payer: 59

## 2018-12-03 DIAGNOSIS — M25512 Pain in left shoulder: Principal | ICD-10-CM

## 2018-12-03 DIAGNOSIS — M6281 Muscle weakness (generalized): Secondary | ICD-10-CM

## 2018-12-03 DIAGNOSIS — G8929 Other chronic pain: Secondary | ICD-10-CM

## 2018-12-03 DIAGNOSIS — M542 Cervicalgia: Secondary | ICD-10-CM | POA: Diagnosis not present

## 2018-12-03 NOTE — Therapy (Signed)
[facility] [facility] [address], [place], [address] Phone: [phone]   Fax:  [phone]  Physical Therapy Treatment  Patient Details  Name: Alison Rodriguez MRN: [id number] Date of Birth: 02-27-1961 Referring Provider (PT): Einar Pheasant, MD   Encounter Date: 12/03/2018  PT End of Session - 12/03/18 0906    Visit Number  13    Number of Visits  21    Date for PT Re-Evaluation  12/19/18    Authorization Type  [employer]    PT Start Time  0815    PT Stop Time  0900    PT Time Calculation (min)  45 min    Activity Tolerance  Patient tolerated treatment well    Behavior During Therapy  [facility] for tasks assessed/performed       Past Medical History:  Diagnosis Date  . Blood in stool    H/O  . Hyperlipidemia   . Hypertension   . Thyroid disease     Past Surgical History:  Procedure Laterality Date  . UTERINE FIBROID EMBOLIZATION      There were no vitals filed for this visit.  Subjective Assessment - 12/03/18 0912    Subjective  Pt states no increase in pain in left shoulder. Pt states that PT and exercise continues to improve her shoulder pain and mobility.     Pertinent History  Patient is a 58 y.o. female who presents to outpatient physical therapy with a referral for left shoulder pain. This patient's chief complaints consist of pain, leading to the following functional deficits: difficulty with ADLs, IADLs, dressing, reaching, working out. Relevant past medical history and comorbidities include obesity, headaches Imaging: recent radiographs in physician's office, pt states it showed osteoarthritis.     Diagnostic tests  recent radiographs in physician's office, pt states it showed osteoarthritis.    Patient Stated Goals  return to PLOF without pain. Start going to Yoga Jan 1    Pain Onset  More than a month ago       TREATMENT Therapeutic Exercise MET in supine for shoulder IR: 3 min with patient is  supine Shoulder IR towel stretch B: 2x20 Shoulder IR in standing B THB Pearline Cables): 2x20  Shoulder ER in standing THB (Red): 2x20 Shoulder elevation in standing: 3 lbs, Shoulder raises behind back (left) (IR): 3 lbs, 2x20 Standing rows at OMEGA, 25lbs: 3x20  Incline Push-up + on wall: 3x15  Therapeutic exercises to improve shoulder IR and periscapular shoulder muscles to improve strength, mobility, and functional activities such as donning/doffing a coat.       PT Education - 12/03/18 0905    Education Details  Form/technique with exercise. Purpose of serratus anterior for shoulder motion.    Person(s) Educated  Patient    Methods  Explanation;Demonstration;Verbal cues    Comprehension  Verbalized understanding;Returned demonstration       PT Short Term Goals - 10/08/18 1333      PT SHORT TERM GOAL #1   Title  Be independent with home exercise program completed at least 3 times per week for self-management of symptoms.    Baseline  Initial HEP provided at initial eval (10/08/2018)    Time  2    Period  Weeks    Status  New    Target Date  10/22/18        PT Long Term Goals - 11/21/18 0755      PT LONG TERM GOAL #1   Title  Be independent with a long-term home exercise program for self-management of symptoms.     Baseline  Initial HEP provided at initial eval (10/08/2018); 11/21/2017: moderate cueing for completion    Time  6    Period  Weeks    Status  On-going      PT LONG TERM GOAL #2   Title  Reduce pain with functional activities to equal or less than 1/10 to allow patient to complete usual activities including ADLs, IADLs, and social engagement with less difficulty.     Baseline  during functional activity 7/10 (10/08/2018); 11/21/2017: 3/10    Time  6    Period  Weeks    Status  On-going      PT LONG TERM GOAL #3   Title  Patient will demonstrate improved ability to perform functional tasks as exhibited by at least 10 point improvement in FOTO score    Baseline  55  (10/08/2018); 11/21/2018: 67    Time  6    Period  Weeks    Status  Achieved      PT LONG TERM GOAL #4   Title  Complete community, work and/or recreational activities without limitation due to current condition.     Baseline  difficulty dressing (putting on jacket/clothing, reaching is difficult, wakes her at night, driving/trying to put on seatbelt, turning quickly to the left, difficulty looking left, unable to complete usual workout routing (10/08/2018); 11/21/2018: Difficulty with doffing and donning jacket    Time  6    Period  Weeks    Status  On-going      PT LONG TERM GOAL #5   Title  Have full cervical spine and left shoulder AROM with no increase in pain in all planes except intermittent end range discomfort to allow patient to complete valued activities with less difficulty.     Baseline  see objective exam: limits in cervical spine and left shoulder AROM with pain (10/08/2018); 11/21/2018: No pain with AROM    Time  6    Period  Weeks    Status  Achieved            Plan - 12/03/18 0908    Clinical Impression Statement  Focused again on pt's left shoulder IR. Pt continues to have trouble donning/doffing coat but states her motion has improved and PT has made a significant impact. Pt continues to improve with shoulde rmobility and strength but can continue to beenfit from skilled treatment to return to prior levels of activity and function.    Clinical Presentation  Stable    Rehab Potential  Good    Clinical Impairments Affecting Rehab Potential  (+) desire to get better, prior level of activity; (-) concern for appearance.     PT Frequency  2x / week    PT Duration  6 weeks    PT Treatment/Interventions  ADLs/Self Care Home Management;Electrical Stimulation;Cryotherapy;Moist Heat;Functional mobility training;Therapeutic activities;Therapeutic exercise;Neuromuscular re-education;Patient/family education;Manual techniques;Passive range of motion;Dry needling;Taping;Joint  Manipulations;Spinal Manipulations;Other (comment)   joint mobilizations grades I-V   PT Next Visit Plan  progress strengthening and specific exercises as appropriate. continue to educate on proper form and provide interventions that encourage self-efficacy.     PT Home Exercise Plan  [employer] Access Code: [email or web address]     Consulted and Agree with Plan of Care  Patient       Patient will benefit from skilled therapeutic intervention in order to improve the following deficits and impairments:  Decreased endurance, Decreased mobility,  Hypomobility, Decreased range of motion, Impaired perceived functional ability, Obesity, Improper body mechanics, Decreased activity tolerance, Decreased strength, Impaired flexibility, Impaired UE functional use, Pain, Postural dysfunction  Visit Diagnosis: Chronic left shoulder pain  Muscle weakness (generalized)     Problem List Patient Active Problem List   Diagnosis Date Noted  . Herpes zoster 05/10/2018  . Urinary frequency 05/10/2018  . Trichomonas infection 05/10/2018  . Abnormal chest CT 02/07/2016  . Health care maintenance 01/25/2015  . Dysuria 07/19/2014  . BMI 32.0-32.9,adult 07/19/2014  . Hemorrhoids 11/30/2013  . Abnormal Pap smear of cervix 11/30/2013  . Chest tightness 11/30/2013  . Headache 11/26/2013  . Hypercholesterolemia 11/26/2013  . Blood pressure elevated without history of HTN 11/26/2013    Rachael Fee, PT DPT 12/03/2018, 9:15 AM  [place] [address] [place], [address] Phone: [phone]   Fax:  [phone]  Name: Alison Rodriguez MRN: [id number] Date of Birth: [birth year]

## 2018-12-05 ENCOUNTER — Ambulatory Visit: Payer: 59

## 2018-12-05 DIAGNOSIS — M25512 Pain in left shoulder: Principal | ICD-10-CM

## 2018-12-05 DIAGNOSIS — M542 Cervicalgia: Secondary | ICD-10-CM | POA: Diagnosis not present

## 2018-12-05 DIAGNOSIS — G8929 Other chronic pain: Secondary | ICD-10-CM

## 2018-12-05 DIAGNOSIS — M6281 Muscle weakness (generalized): Secondary | ICD-10-CM

## 2018-12-05 NOTE — Therapy (Signed)
[facility] [address] [place], [address] Phone: [phone]   Fax:  [phone]  Physical Therapy Treatment  Patient Details  Name: Alison Rodriguez MRN: [id number] Date of Birth: Sep 21, 1961 Referring Provider (PT): Einar Pheasant, MD   Encounter Date: 12/05/2018  PT End of Session - 12/05/18 0854    Visit Number  14    Number of Visits  21    Date for PT Re-Evaluation  12/19/18    Authorization Type  UHC    PT Start Time  0801    PT Stop Time  0846    PT Time Calculation (min)  45 min    Activity Tolerance  Patient tolerated treatment well    Behavior During Therapy  [facility] for tasks assessed/performed       Past Medical History:  Diagnosis Date  . Blood in stool    H/O  . Hyperlipidemia   . Hypertension   . Thyroid disease     Past Surgical History:  Procedure Laterality Date  . UTERINE FIBROID EMBOLIZATION      There were no vitals filed for this visit.  Subjective Assessment - 12/05/18 0852    Subjective  No mention from pt of left shoulder pain. Patient reports she received her treadmill and have been performing her HEP at home.     Pertinent History  Patient is a 58 y.o. female who presents to outpatient physical therapy with a referral for left shoulder pain. This patient's chief complaints consist of pain, leading to the following functional deficits: difficulty with ADLs, IADLs, dressing, reaching, working out. Relevant past medical history and comorbidities include obesity, headaches Imaging: recent radiographs in physician's office, pt states it showed osteoarthritis.     Diagnostic tests  recent radiographs in physician's office, pt states it showed osteoarthritis.    Patient Stated Goals  return to PLOF without pain. Start going to Yoga Jan 1    Currently in Pain?  No/denies    Pain Onset  More than a month ago       TREATMENT Therapeutic Exercise  Supine Horizontal adduction  (Blocking scapula) MET:  3 min  Standing IR with shoulder and elbow at 90 degrees: 1 min  Standing TB (Grey) IR: 2x20   Standing TB (Blue) ER: 2x15  Standing Rows at [facility]: 2x20, 25 lbs  Standing DB shoulder raises behind back (IR): 2x20, 3 lbs  Standing B DB elevation: 1x25, 4 lbs  Standing straight arm pulldown OMEGA: 2x20, 20 lbs  Lat pulldown: 2x20, 35 lbs  Inclined push-up +: 1x20  Therapeutic Exercise to improve function of ADL's and strengthen shoulder musculature.   PT Education - 12/05/18 0854    Education Details  Form/technique with exercise.    Person(s) Educated  Patient    Methods  Tactile cues    Comprehension  Verbalized understanding;Returned demonstration       PT Short Term Goals - 10/08/18 1333      PT SHORT TERM GOAL #1   Title  Be independent with home exercise program completed at least 3 times per week for self-management of symptoms.    Baseline  Initial HEP provided at initial eval (10/08/2018)    Time  2    Period  Weeks    Status  New    Target Date  10/22/18        PT Long Term Goals - 12/05/18 0900      PT LONG TERM GOAL #  1   Title  Be independent with a long-term home exercise program for self-management of symptoms.     Baseline  Initial HEP provided at initial eval (10/08/2018); 11/21/2017: moderate cueing for completion; 12/05/2018: Independent with HEP    Time  6    Period  Weeks    Status  Achieved      PT LONG TERM GOAL #2   Title  Reduce pain with functional activities to equal or less than 1/10 to allow patient to complete usual activities including ADLs, IADLs, and social engagement with less difficulty.     Baseline  during functional activity 7/10 (10/08/2018); 11/21/2017: 3/10; 12/05/2018: Pain 1/10    Time  6    Period  Weeks    Status  Achieved      PT LONG TERM GOAL #3   Title  Patient will demonstrate improved ability to perform functional tasks as exhibited by at least 10 point improvement in FOTO score    Baseline  55  (10/08/2018); 11/21/2018: 67    Time  6    Period  Weeks    Status  Achieved      PT LONG TERM GOAL #4   Title  Complete community, work and/or recreational activities without limitation due to current condition.     Baseline  difficulty dressing (putting on jacket/clothing, reaching is difficult, wakes her at night, driving/trying to put on seatbelt, turning quickly to the left, difficulty looking left, unable to complete usual workout routing (10/08/2018); 11/21/2018: Difficulty with doffing and donning jacket; 12/05/2018: No issues with completing work or recreational activtiies.    Time  6    Period  Weeks    Status  Achieved      PT LONG TERM GOAL #5   Title  Have full cervical spine and left shoulder AROM with no increase in pain in all planes except intermittent end range discomfort to allow patient to complete valued activities with less difficulty.     Baseline  see objective exam: limits in cervical spine and left shoulder AROM with pain (10/08/2018); 11/21/2018: No pain with AROM    Time  6    Period  Weeks    Status  Achieved            Plan - 12/05/18 0903    Clinical Impression Statement  Pt has completed all of her goals: Pt has returned to recreational activities such as being independent resistance exercises at home and independent with HEP, Less than or equal to 1/10 pain  with functional acitvities, ADL's/IADL's, and no issues with donning/doffing clothes. Pt has also improved all resistance in exercises with little to no cueing. Patient is to be discharged from PT.   Rehab Potential  Good    Clinical Impairments Affecting Rehab Potential  (+) desire to get better, prior level of activity; (-) concern for appearance.     PT Frequency  2x / week    PT Duration  6 weeks    PT Treatment/Interventions  ADLs/Self Care Home Management;Electrical Stimulation;Cryotherapy;Moist Heat;Functional mobility training;Therapeutic activities;Therapeutic exercise;Neuromuscular  re-education;Patient/family education;Manual techniques;Passive range of motion;Dry needling;Taping;Joint Manipulations;Spinal Manipulations;Other (comment)   joint mobilizations grades I-V   PT Next Visit Plan  progress strengthening and specific exercises as appropriate. continue to educate on proper form and provide interventions that encourage self-efficacy.     PT Home Exercise Plan  [employer] Access Code: [email or web address]     Consulted and Agree with Plan of Care  Patient  Patient will benefit from skilled therapeutic intervention in order to improve the following deficits and impairments:  Decreased endurance, Decreased mobility, Hypomobility, Decreased range of motion, Impaired perceived functional ability, Obesity, Improper body mechanics, Decreased activity tolerance, Decreased strength, Impaired flexibility, Impaired UE functional use, Pain, Postural dysfunction  Visit Diagnosis: Chronic left shoulder pain  Muscle weakness (generalized)  Cervicalgia     Problem List Patient Active Problem List   Diagnosis Date Noted  . Herpes zoster 05/10/2018  . Urinary frequency 05/10/2018  . Trichomonas infection 05/10/2018  . Abnormal chest CT 02/07/2016  . Health care maintenance 01/25/2015  . Dysuria 07/19/2014  . BMI 32.0-32.9,adult 07/19/2014  . Hemorrhoids 11/30/2013  . Abnormal Pap smear of cervix 11/30/2013  . Chest tightness 11/30/2013  . Headache 11/26/2013  . Hypercholesterolemia 11/26/2013  . Blood pressure elevated without history of HTN 11/26/2013    Rachael Fee, SPT 12/05/2018, 9:19 AM  [place] [facility] [address] [place], [address] Phone: [phone]   Fax:  [phone]  Name: Alison Rodriguez MRN: [id number] Date of Birth: 1961/08/27

## 2019-03-13 ENCOUNTER — Other Ambulatory Visit: Payer: Self-pay | Admitting: Internal Medicine

## 2019-03-14 ENCOUNTER — Ambulatory Visit (INDEPENDENT_AMBULATORY_CARE_PROVIDER_SITE_OTHER): Payer: 59 | Admitting: Internal Medicine

## 2019-03-14 ENCOUNTER — Encounter: Payer: Self-pay | Admitting: Internal Medicine

## 2019-03-14 ENCOUNTER — Other Ambulatory Visit: Payer: Self-pay

## 2019-03-14 DIAGNOSIS — E7800 Pure hypercholesterolemia, unspecified: Secondary | ICD-10-CM | POA: Diagnosis not present

## 2019-03-14 DIAGNOSIS — J029 Acute pharyngitis, unspecified: Secondary | ICD-10-CM

## 2019-03-14 NOTE — Progress Notes (Signed)
Patient ID: Alison Rodriguez, female   DOB: July 17, 1961, 58 y.o.   MRN: [id number] Virtual Visit via video Note  This visit type was conducted due to national recommendations for restrictions regarding the COVID-19 pandemic (e.g. social distancing).  This format is felt to be most appropriate for this patient at this time.  All issues noted in this document were discussed and addressed.  No physical exam was performed (except for noted visual exam findings with Video Visits).   I connected with Alison Rodriguez on 03/14/19 at  8:00 AM EDT by a video enabled telemedicine application and verified that I am speaking with the correct person using two identifiers. Location patient: home Location provider: work Persons participating in the virtual visit: patient, provider  I discussed the limitations, risks, security and privacy concerns of performing an evaluation and management service by video and the availability of in person appointments.  The patient expressed understanding and agreed to proceed.   Reason for visit: scheduled follow up  HPI: She reports she is doing well.  Feels good.  Her sister had a co worker who was diagnosed with COVID.  She has quarantined herself at home.  She has not had any fever.  No congestion, chest congestion, cough or sob.  States this am she has noticed minimal sore throat.  Has been checking her temp.  No acid reflux.  No aching.  Does not feel bad.  No abdominal pain.  Bowels moving.  Handling stress.     ROS: See pertinent positives and negatives per HPI.  Past Medical History:  Diagnosis Date  . Blood in stool    H/O  . Hyperlipidemia   . Hypertension   . Thyroid disease     Past Surgical History:  Procedure Laterality Date  . UTERINE FIBROID EMBOLIZATION      Family History  Problem Relation Age of Onset  . Diabetes Mother   . Diabetes Maternal Grandmother   . Arthritis Maternal Grandfather   . Diabetes Maternal Grandfather   . Breast cancer  Other        second cousins/aunt    SOCIAL HX: reviewed.    Current Outpatient Medications:  .  hydrocortisone (ANUSOL-HC) 25 MG suppository, Place 1 suppository (25 mg total) rectally 2 (two) times daily., Disp: 14 suppository, Rfl: 0 .  rosuvastatin (CRESTOR) 10 MG tablet, TAKE ONE TABLET BY MOUTH ONE TIME DAILY, Disp: 30 tablet, Rfl: 11  EXAM:  VITALS per patient if applicable: weight 643 pounds.   GENERAL: alert, oriented, appears well and in no acute distress  HEENT: atraumatic, conjunttiva clear, no obvious abnormalities on inspection of external nose and ears  NECK: normal movements of the head and neck  LUNGS: on inspection no signs of respiratory distress, breathing rate appears normal, no obvious gross SOB, gasping or wheezing  CV: no obvious cyanosis  PSYCH/NEURO: pleasant and cooperative, no obvious depression or anxiety, speech and thought processing grossly intact  ASSESSMENT AND PLAN:  Discussed the following assessment and plan:  Sore throat - Just started this am.  No other symptoms.  Will monitor and give [place] update.  If any worsening problems, she will be evaluated.    Hypercholesterolemia - Plan: Lipid panel, Hepatic function panel, Basic metabolic panel  Hypercholesterolemia On crestor and doing well.  Follow lipid panel and liver function tests.  Low cholesterol diet and exercise.  States she plans to start weight watchers.      I discussed the assessment and treatment plan with the  patient. The patient was provided an opportunity to ask questions and all were answered. The patient agreed with the plan and demonstrated an understanding of the instructions.   The patient was advised to call back or seek an in-person evaluation if the symptoms worsen or if the condition fails to improve as anticipated.    Einar Pheasant, MD

## 2019-03-14 NOTE — Assessment & Plan Note (Signed)
On crestor and doing well.  Follow lipid panel and liver function tests.  Low cholesterol diet and exercise.  States she plans to start weight watchers.

## 2019-05-08 ENCOUNTER — Encounter: Payer: Self-pay | Admitting: Internal Medicine

## 2019-05-09 ENCOUNTER — Telehealth: Payer: Self-pay

## 2019-05-09 ENCOUNTER — Other Ambulatory Visit: Payer: Self-pay

## 2019-05-09 NOTE — Telephone Encounter (Signed)
Pt was transferred to [facility] for appt today with yeast infection; unfortunately we have no available appts left for today.pt said she would schedule appt on 05/12/19 at [facility] office; I warm transferred pt to [place] at [facility] to assist pt.

## 2019-05-09 NOTE — Telephone Encounter (Signed)
Patient scheduled for 05/12/19

## 2019-05-12 ENCOUNTER — Ambulatory Visit (INDEPENDENT_AMBULATORY_CARE_PROVIDER_SITE_OTHER): Payer: 59 | Admitting: Internal Medicine

## 2019-05-12 ENCOUNTER — Encounter: Payer: Self-pay | Admitting: Internal Medicine

## 2019-05-12 ENCOUNTER — Other Ambulatory Visit: Payer: Self-pay

## 2019-05-12 ENCOUNTER — Other Ambulatory Visit (HOSPITAL_COMMUNITY)
Admission: RE | Admit: 2019-05-12 | Discharge: 2019-05-12 | Disposition: A | Discharge status: Home / Self Care | Payer: 59 | Source: Ambulatory Visit | Attending: Internal Medicine | Admitting: Internal Medicine

## 2019-05-12 VITALS — BP 134/80 | HR 83 | Temp 98.3°F | Resp 16 | Wt 161.8 lb

## 2019-05-12 DIAGNOSIS — N760 Acute vaginitis: Secondary | ICD-10-CM | POA: Insufficient documentation

## 2019-05-12 DIAGNOSIS — R300 Dysuria: Secondary | ICD-10-CM

## 2019-05-12 LAB — POCT URINALYSIS DIPSTICK
Bilirubin, UA: NEGATIVE
Glucose, UA: POSITIVE — AB
Protein, UA: POSITIVE — AB
Spec Grav, UA: 1.02 (ref 1.010–1.025)
Urobilinogen, UA: 1 E.U./dL
pH, UA: 5.5 (ref 5.0–8.0)

## 2019-05-12 LAB — URINALYSIS, MICROSCOPIC ONLY

## 2019-05-12 MED ORDER — NITROFURANTOIN MONOHYD MACRO 100 MG PO CAPS: 100.0000 mg | ORAL_CAPSULE | Freq: Two times a day (BID) | ORAL | 0 refills | Status: DC

## 2019-05-12 NOTE — Patient Instructions (Signed)
Take a probiotic daily while you are on the antibiotics and for two weeks after completing the antibiotics.

## 2019-05-12 NOTE — Progress Notes (Signed)
Patient ID: Alison Rodriguez, female   DOB: [birth year], 58 y.o.   MRN: [id number]   Subjective:    Patient ID: Alison Rodriguez, female    DOB: [birth year], 29 y.o.   MRN: [id number]  HPI  Patient here as a work in appt with concerns regarding possible yeast infection.  She reports that symptoms started 10 days ago.  Noticed increased nocturia.  States has been going q 2 hours at night.  Some discomfort with urination.  No hematuria.  No fever.  Some vaginal irritations.  No discharge.  She states symptoms are similar to previous yeast infections.  No abdominal pain or back pain.  Eating and drinking well.     Past Medical History:  Diagnosis Date  . Blood in stool    H/O  . Hyperlipidemia   . Hypertension   . Thyroid disease    Past Surgical History:  Procedure Laterality Date  . UTERINE FIBROID EMBOLIZATION     Family History  Problem Relation Age of Onset  . Diabetes Mother   . Diabetes Maternal Grandmother   . Arthritis Maternal Grandfather   . Diabetes Maternal Grandfather   . Breast cancer Other        second cousins/aunt   Social History   Socioeconomic History  . Marital status: Married    Spouse name: Not on file  . Number of children: Not on file  . Years of education: Not on file  . Highest education level: Not on file  Occupational History  . Not on file  Social Needs  . Financial resource strain: Not on file  . Food insecurity    Worry: Not on file    Inability: Not on file  . Transportation needs    Medical: Not on file    Non-medical: Not on file  Tobacco Use  . Smoking status: Never Smoker  . Smokeless tobacco: Never Used  Substance and Sexual Activity  . Alcohol use: No    Alcohol/week: 0.0 standard drinks  . Drug use: No  . Sexual activity: Not on file  Lifestyle  . Physical activity    Days per week: Not on file    Minutes per session: Not on file  . Stress: Not on file  Relationships  . Social Herbalist on phone: Not on file     Gets together: Not on file    Attends religious service: Not on file    Active member of club or organization: Not on file    Attends meetings of clubs or organizations: Not on file    Relationship status: Not on file  Other Topics Concern  . Not on file  Social History Narrative  . Not on file    Outpatient Encounter Medications as of 05/12/2019  Medication Sig  . hydrocortisone (ANUSOL-HC) 25 MG suppository Place 1 suppository (25 mg total) rectally 2 (two) times daily.  . nitrofurantoin, macrocrystal-monohydrate, (MACROBID) 100 MG capsule Take 1 capsule (100 mg total) by mouth 2 (two) times daily.  . rosuvastatin (CRESTOR) 10 MG tablet TAKE ONE TABLET BY MOUTH ONE TIME DAILY   No facility-administered encounter medications on file as of 05/12/2019.     Review of Systems  Constitutional: Negative for appetite change, fever and unexpected weight change.  Respiratory: Negative for cough and shortness of breath.   Gastrointestinal: Negative for abdominal pain, diarrhea, nausea and vomiting.  Genitourinary: Positive for dysuria and frequency. Negative for vaginal discharge.       Some  vaginal irritation.   Musculoskeletal: Negative for back pain and myalgias.  Skin: Negative for color change and rash.  Psychiatric/Behavioral: Negative for agitation and dysphoric mood.       Objective:    Physical Exam Constitutional:      General: She is not in acute distress.    Appearance: Normal appearance.  Neck:     Thyroid: No thyromegaly.  Cardiovascular:     Rate and Rhythm: Normal rate and regular rhythm.  Pulmonary:     Effort: No respiratory distress.     Breath sounds: Normal breath sounds. No wheezing.  Abdominal:     General: Bowel sounds are normal.     Palpations: Abdomen is soft.     Tenderness: There is no abdominal tenderness.  Genitourinary:    Comments: Normal external genitalia.  Vaginal vault without lesions.  Discharge present could not appreciate any adnexal  tenderness.   Musculoskeletal:        General: No swelling or tenderness.     Comments: No CVA tenderness to palpation.    Skin:    Findings: No erythema or rash.  Neurological:     Mental Status: She is alert.  Psychiatric:        Mood and Affect: Mood normal.        Behavior: Behavior normal.     BP 134/80   Pulse 83   Temp 98.3 F (36.8 C) (Oral)   Resp 16   Wt 161 lb 12.8 oz (73.4 kg)   LMP 06/22/2015 (Approximate)   SpO2 97%   BMI 32.68 kg/m  Wt Readings from Last 3 Encounters:  05/12/19 161 lb 12.8 oz (73.4 kg)  09/12/18 160 lb 3.2 oz (72.7 kg)  05/10/18 157 lb 9.6 oz (71.5 kg)     Lab Results  Component Value Date   WBC 5.1 03/13/2018   HGB 14.3 03/13/2018   HCT 43.5 03/13/2018   PLT 291.0 03/13/2018   GLUCOSE 93 09/12/2018   CHOL 166 09/12/2018   TRIG 65.0 09/12/2018   HDL 51.30 09/12/2018   LDLCALC 102 (H) 09/12/2018   ALT 16 09/12/2018   AST 13 09/12/2018   NA 141 09/12/2018   K 4.2 09/12/2018   CL 106 09/12/2018   CREATININE 0.67 09/12/2018   BUN 12 09/12/2018   CO2 28 09/12/2018   TSH 1.43 09/12/2018    Mm 3d Screen Breast Bilateral  Result Date: 07/18/2018 CLINICAL DATA:  Screening. EXAM: DIGITAL SCREENING BILATERAL MAMMOGRAM WITH TOMO AND CAD COMPARISON:  Previous exam(s). ACR Breast Density Category c: The breast tissue is heterogeneously dense, which may obscure small masses. FINDINGS: There are no findings suspicious for malignancy. Images were processed with CAD. IMPRESSION: No mammographic evidence of malignancy. A result letter of this screening mammogram will be mailed directly to the patient. RECOMMENDATION: Screening mammogram in one year. (Code:SM-B-01Y) BI-RADS CATEGORY  1: Negative. Electronically Signed   By: Lajean Manes M.D.   On: 07/18/2018 12:03       Assessment & Plan:   Problem List Items Addressed This Visit    Dysuria - Primary    Symptoms as outlined.  Urine dip c/w uti.  Send culture.  Treat with macrobid.  Probiotic  as directed.  Follow culture.        Relevant Orders   POCT urinalysis dipstick (Completed)   Urine Culture   Urine Microscopic (Completed)   Vaginitis    Urinary symptoms as outlined.  Treat for probable uti.  Await culture results.  Some vaginal irritation.  Discharge present on exam.  KOH/Wet prep sent.  Await results prior to treatment.        Relevant Orders   Cervicovaginal ancillary only( [facility])       Einar Pheasant, MD

## 2019-05-12 NOTE — Assessment & Plan Note (Signed)
Urinary symptoms as outlined.  Treat for probable uti.  Await culture results.  Some vaginal irritation.  Discharge present on exam.  KOH/Wet prep sent.  Await results prior to treatment.

## 2019-05-12 NOTE — Assessment & Plan Note (Signed)
Symptoms as outlined.  Urine dip c/w uti.  Send culture.  Treat with macrobid.  Probiotic as directed.  Follow culture.

## 2019-05-13 LAB — URINE CULTURE
MICRO NUMBER:: 593312
SPECIMEN QUALITY:: ADEQUATE

## 2019-05-13 LAB — CERVICOVAGINAL ANCILLARY ONLY
Bacterial vaginitis: NEGATIVE
Candida vaginitis: NEGATIVE
Chlamydia: NEGATIVE
Neisseria Gonorrhea: NEGATIVE
Trichomonas: POSITIVE — AB

## 2019-05-14 ENCOUNTER — Other Ambulatory Visit: Payer: Self-pay

## 2019-05-14 MED ORDER — METRONIDAZOLE 500 MG PO TABS: 2000.0000 mg | ORAL_TABLET | Freq: Once | ORAL | 0 refills | Status: AC

## 2019-05-16 ENCOUNTER — Encounter: Payer: Self-pay | Admitting: Internal Medicine

## 2019-05-19 ENCOUNTER — Other Ambulatory Visit: Payer: Self-pay

## 2019-05-19 ENCOUNTER — Other Ambulatory Visit (INDEPENDENT_AMBULATORY_CARE_PROVIDER_SITE_OTHER): Payer: 59

## 2019-05-19 DIAGNOSIS — E7800 Pure hypercholesterolemia, unspecified: Secondary | ICD-10-CM

## 2019-05-19 LAB — HEPATIC FUNCTION PANEL
ALT: 17 U/L (ref 0–35)
AST: 14 U/L (ref 0–37)
Albumin: 3.8 g/dL (ref 3.5–5.2)
Alkaline Phosphatase: 51 U/L (ref 39–117)
Bilirubin, Direct: 0.1 mg/dL (ref 0.0–0.3)
Total Bilirubin: 0.4 mg/dL (ref 0.2–1.2)
Total Protein: 6.6 g/dL (ref 6.0–8.3)

## 2019-05-19 LAB — BASIC METABOLIC PANEL
BUN: 13 mg/dL (ref 6–23)
CO2: 24 mEq/L (ref 19–32)
Calcium: 9 mg/dL (ref 8.4–10.5)
Chloride: 109 mEq/L (ref 96–112)
Creatinine, Ser: 0.58 mg/dL (ref 0.40–1.20)
GFR: 130.34 mL/min (ref >60.00)
Glucose, Bld: 85 mg/dL (ref 70–99)
Potassium: 4 mEq/L (ref 3.5–5.1)
Sodium: 142 mEq/L (ref 135–145)

## 2019-05-19 LAB — LIPID PANEL
Cholesterol: 154 mg/dL (ref 0–200)
HDL: 46.2 mg/dL (ref >39.00)
LDL Cholesterol: 92 mg/dL (ref 0–99)
NonHDL: 107.99
Total CHOL/HDL Ratio: 3
Triglycerides: 81 mg/dL (ref 0.0–149.0)
VLDL: 16.2 mg/dL (ref 0.0–40.0)

## 2019-05-20 ENCOUNTER — Encounter: Payer: Self-pay | Admitting: Internal Medicine

## 2019-06-23 ENCOUNTER — Other Ambulatory Visit: Payer: Self-pay | Admitting: Internal Medicine

## 2019-06-23 DIAGNOSIS — Z1231 Encounter for screening mammogram for malignant neoplasm of breast: Secondary | ICD-10-CM

## 2019-08-07 ENCOUNTER — Ambulatory Visit
Admission: RE | Admit: 2019-08-07 | Discharge: 2019-08-07 | Disposition: A | Discharge status: Home / Self Care | Payer: 59 | Source: Ambulatory Visit | Attending: Internal Medicine | Admitting: Internal Medicine

## 2019-08-07 ENCOUNTER — Other Ambulatory Visit: Payer: Self-pay

## 2019-08-07 DIAGNOSIS — Z1231 Encounter for screening mammogram for malignant neoplasm of breast: Secondary | ICD-10-CM

## 2019-09-04 IMAGING — MG DIGITAL SCREENING BILATERAL MAMMOGRAM WITH TOMO AND CAD
8 series · 9 of 24 positions shown · non-contrast
Comparison: Previous exam(s).

CLINICAL DATA: Screening.

EXAM:
DIGITAL SCREENING BILATERAL MAMMOGRAM WITH TOMO AND CAD

[R MLO synth-2D]
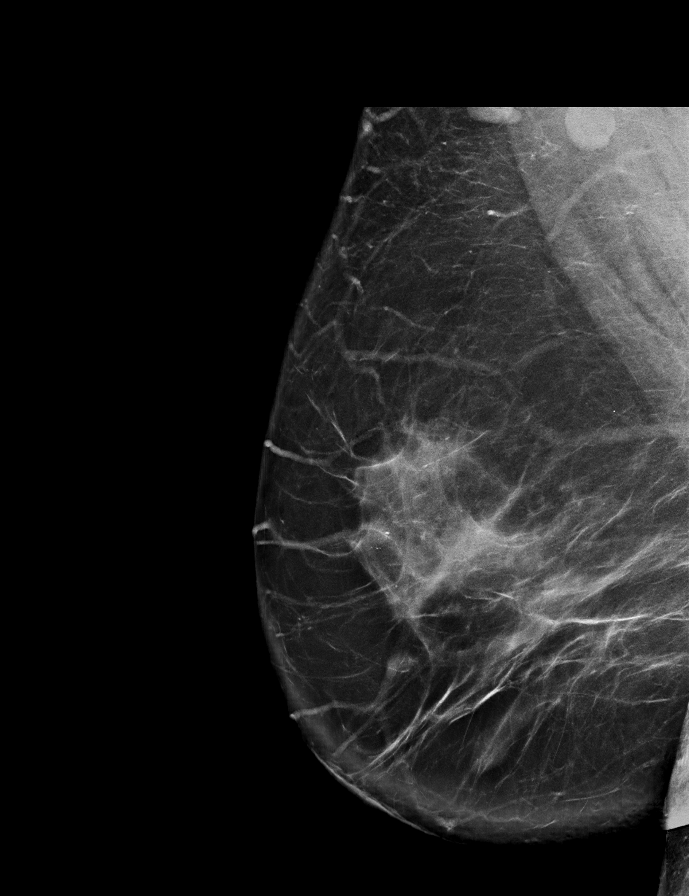

[R CC synth-2D]
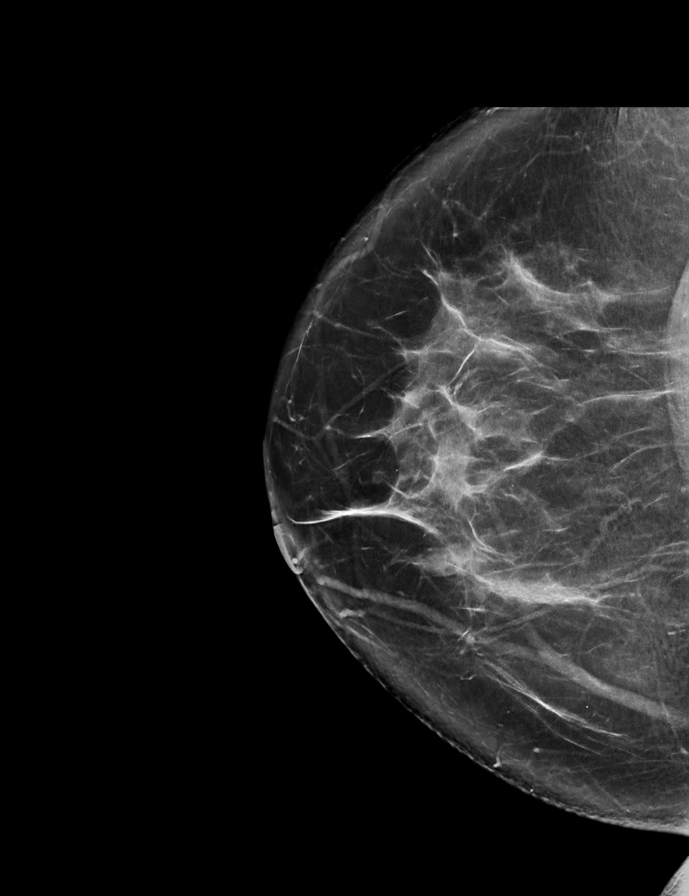

[L MLO synth-2D]
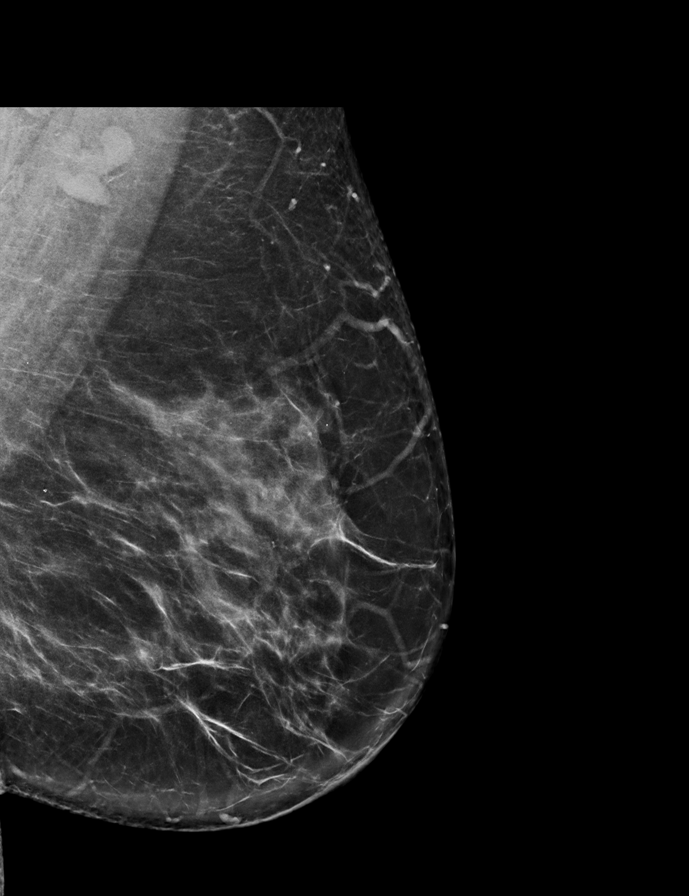

[L CC synth-2D]
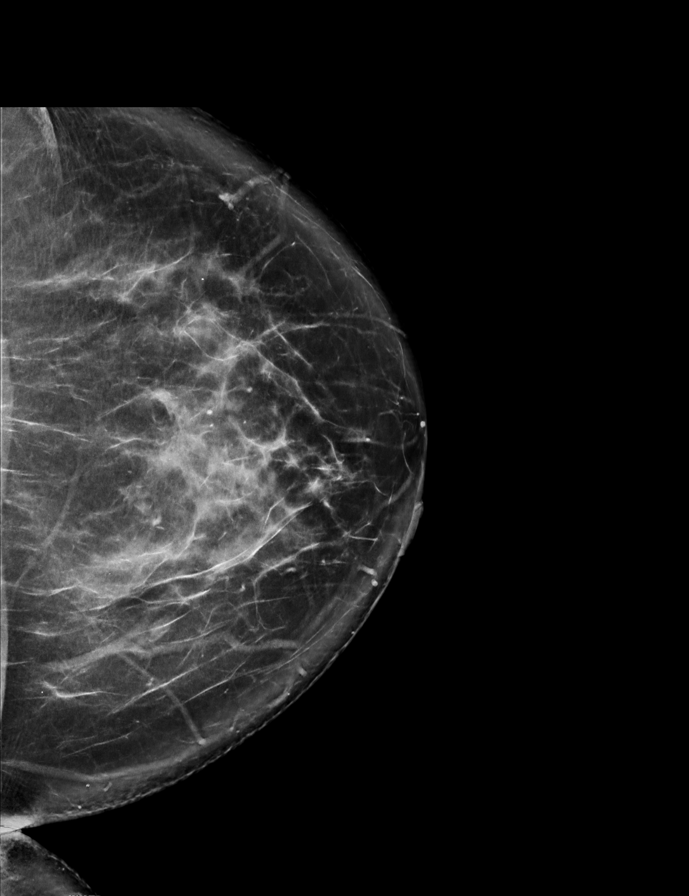

[L CC tomo · 2 of 90 frames shown]
[frame 29/90]
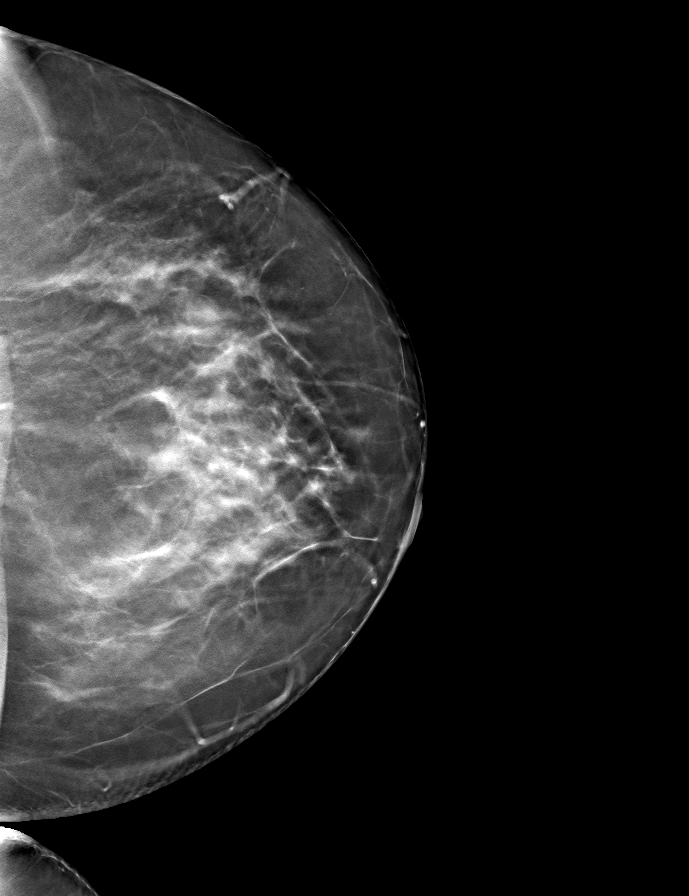
[frame 45/90]
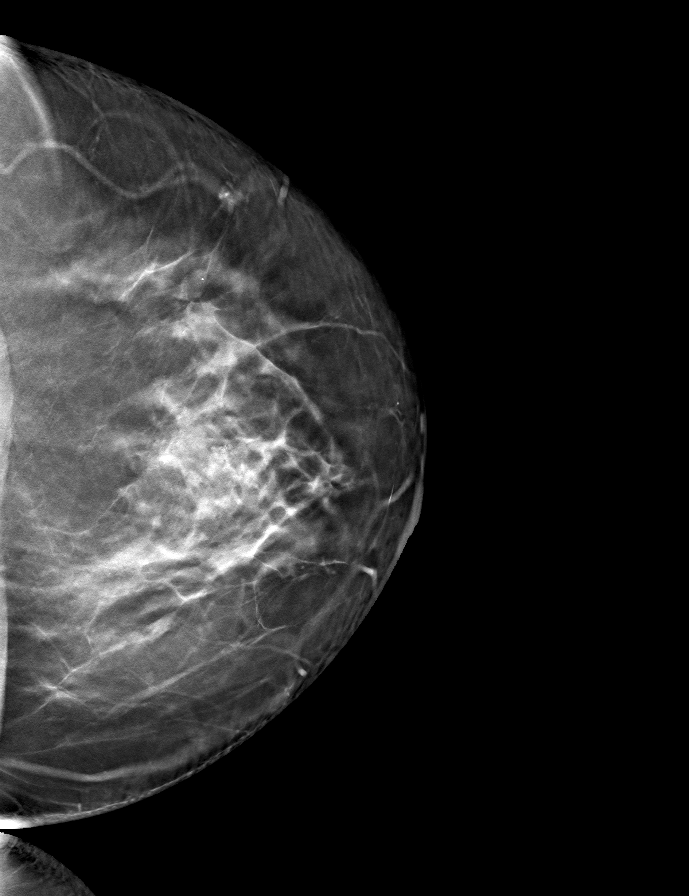

[L MLO tomo · tomo slice 45/88.0]
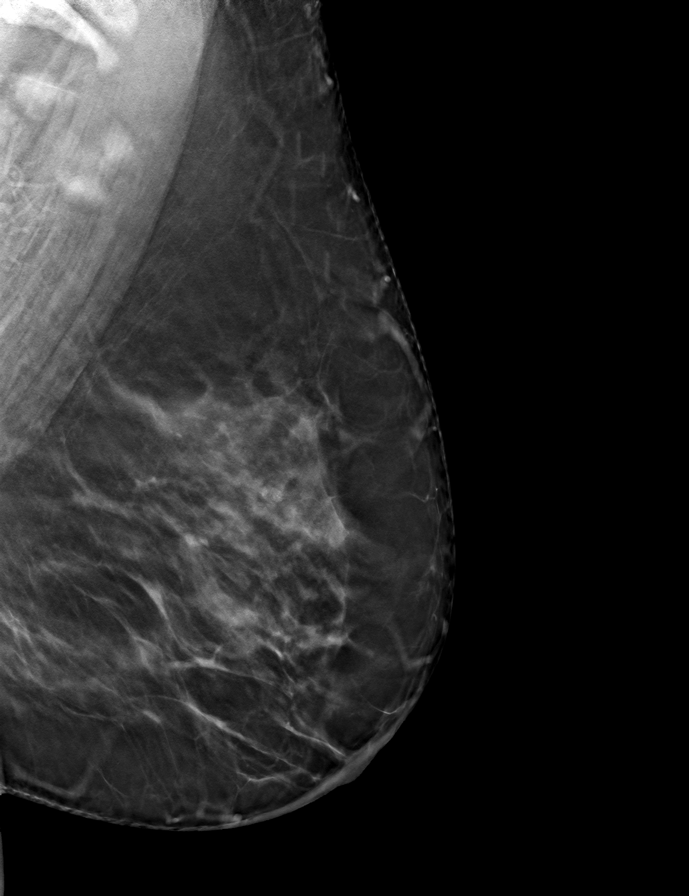

[R CC tomo · tomo slice 45/90.0]
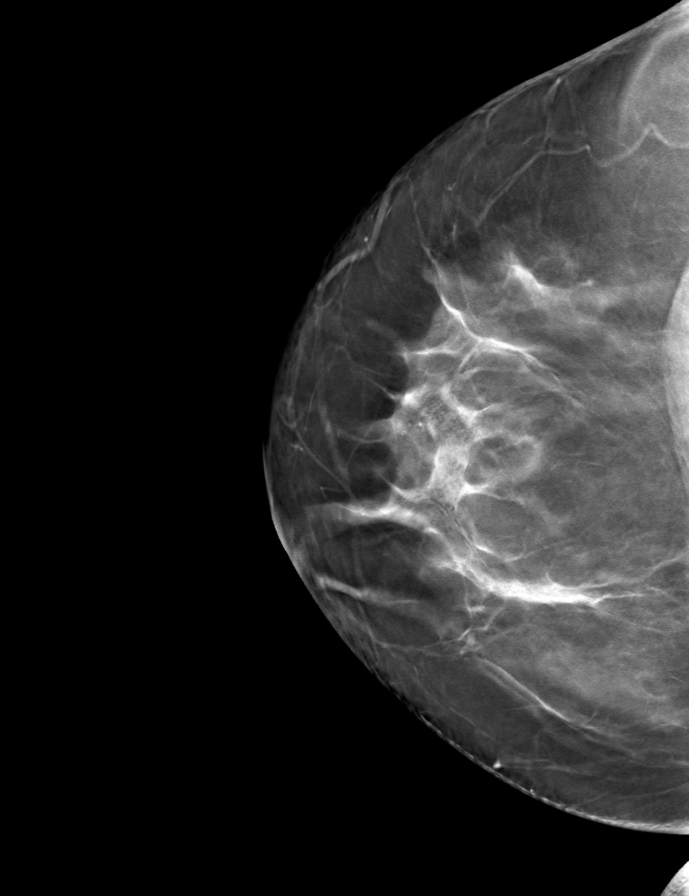

[R MLO tomo · tomo slice 48/95.0]
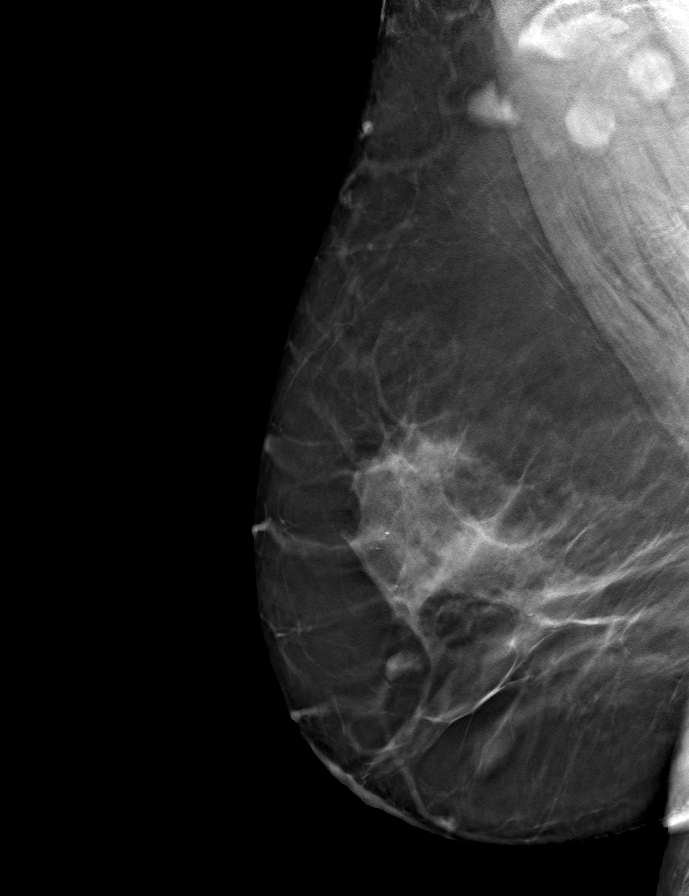

[9 of 24 positions shown; findings below may reference images not displayed]

ACR Breast Density Category c: The breast tissue is heterogeneously
dense, which may obscure small masses.
FINDINGS: There are no findings suspicious for malignancy. Images were
processed with CAD.
IMPRESSION: No mammographic evidence of malignancy. A result letter of this
screening mammogram will be mailed directly to the patient.

RECOMMENDATION:
Screening mammogram in one year. (Code:FT-U-LHB)

BI-RADS CATEGORY  1: Negative.

## 2019-09-15 ENCOUNTER — Other Ambulatory Visit: Payer: Self-pay

## 2019-09-17 ENCOUNTER — Other Ambulatory Visit: Payer: Self-pay

## 2019-09-17 ENCOUNTER — Ambulatory Visit (INDEPENDENT_AMBULATORY_CARE_PROVIDER_SITE_OTHER): Payer: 59 | Admitting: Internal Medicine

## 2019-09-17 ENCOUNTER — Encounter: Payer: Self-pay | Admitting: Internal Medicine

## 2019-09-17 VITALS — BP 124/70 | HR 71 | Temp 97.1°F | Resp 16 | Ht 59.0 in | Wt 160.0 lb

## 2019-09-17 DIAGNOSIS — N950 Postmenopausal bleeding: Secondary | ICD-10-CM | POA: Diagnosis not present

## 2019-09-17 DIAGNOSIS — Z0000 Encounter for general adult medical examination without abnormal findings: Secondary | ICD-10-CM

## 2019-09-17 DIAGNOSIS — R21 Rash and other nonspecific skin eruption: Secondary | ICD-10-CM | POA: Diagnosis not present

## 2019-09-17 DIAGNOSIS — Z1211 Encounter for screening for malignant neoplasm of colon: Secondary | ICD-10-CM | POA: Diagnosis not present

## 2019-09-17 DIAGNOSIS — E7800 Pure hypercholesterolemia, unspecified: Secondary | ICD-10-CM | POA: Diagnosis not present

## 2019-09-17 LAB — BASIC METABOLIC PANEL
BUN: 11 mg/dL (ref 6–23)
CO2: 28 mEq/L (ref 19–32)
Calcium: 9.3 mg/dL (ref 8.4–10.5)
Chloride: 105 mEq/L (ref 96–112)
Creatinine, Ser: 0.65 mg/dL (ref 0.40–1.20)
GFR: 114.14 mL/min (ref >60.00)
Glucose, Bld: 89 mg/dL (ref 70–99)
Potassium: 3.9 mEq/L (ref 3.5–5.1)
Sodium: 140 mEq/L (ref 135–145)

## 2019-09-17 LAB — LIPID PANEL
Cholesterol: 162 mg/dL (ref 0–200)
HDL: 51 mg/dL (ref >39.00)
LDL Cholesterol: 99 mg/dL (ref 0–99)
NonHDL: 111.43
Total CHOL/HDL Ratio: 3
Triglycerides: 60 mg/dL (ref 0.0–149.0)
VLDL: 12 mg/dL (ref 0.0–40.0)

## 2019-09-17 LAB — HEPATIC FUNCTION PANEL
ALT: 25 U/L (ref 0–35)
AST: 19 U/L (ref 0–37)
Albumin: 4.2 g/dL (ref 3.5–5.2)
Alkaline Phosphatase: 61 U/L (ref 39–117)
Bilirubin, Direct: 0.1 mg/dL (ref 0.0–0.3)
Total Bilirubin: 0.4 mg/dL (ref 0.2–1.2)
Total Protein: 7.5 g/dL (ref 6.0–8.3)

## 2019-09-17 LAB — CBC WITH DIFFERENTIAL/PLATELET
Basophils Absolute: 0.1 10*3/uL (ref 0.0–0.1)
Basophils Relative: 1.3 % (ref 0.0–3.0)
Eosinophils Absolute: 0.2 10*3/uL (ref 0.0–0.7)
Eosinophils Relative: 3.3 % (ref 0.0–5.0)
HCT: 42.1 % (ref 36.0–46.0)
Hemoglobin: 14.5 g/dL (ref 12.0–15.0)
Lymphocytes Relative: 26.1 % (ref 12.0–46.0)
Lymphs Abs: 1.7 10*3/uL (ref 0.7–4.0)
MCHC: 34.4 g/dL (ref 30.0–36.0)
MCV: 88.4 fl (ref 78.0–100.0)
Monocytes Absolute: 0.4 10*3/uL (ref 0.1–1.0)
Monocytes Relative: 6.3 % (ref 3.0–12.0)
Neutro Abs: 4 10*3/uL (ref 1.4–7.7)
Neutrophils Relative %: 63 % (ref 43.0–77.0)
Platelets: 292 10*3/uL (ref 150.0–400.0)
RBC: 4.76 Mil/uL (ref 3.87–5.11)
RDW: 13.8 % (ref 11.5–15.5)
WBC: 6.4 10*3/uL (ref 4.0–10.5)

## 2019-09-17 NOTE — Progress Notes (Signed)
Patient ID: Alison Rodriguez, female   DOB: 07-Jan-1961, 58 y.o.   MRN: [id number]   Subjective:    Patient ID: Alison Rodriguez, female    DOB: 1961/06/25, 58 y.o.   MRN: [id number]  HPI  Patient here for her physical exam.  She reports she is doing relatively well.  Had vaginal bleeding 2 weeks ago.  Had not had a period for two years prior to this bleeding.  Had some cramping prior to the bleeding.  No pain, cramping or bleeding since that episode.  Tries to stay active.  No chest pain.  No sob. No acid reflux. No abdominal pain.  Bowels moving.  Does report rash on her neck.  Has been using an otc cortisone cream.  Helping.  Better.  Persistent rash - forehead and face.  Discussed f/u with dermatology.  Also due colonoscopy.  Last 07/2009.  Appetite good.  No nausea or vomiting.     Past Medical History:  Diagnosis Date  . Blood in stool    H/O  . Hyperlipidemia   . Hypertension   . Thyroid disease    Past Surgical History:  Procedure Laterality Date  . UTERINE FIBROID EMBOLIZATION     Family History  Problem Relation Age of Onset  . Diabetes Mother   . Diabetes Maternal Grandmother   . Arthritis Maternal Grandfather   . Diabetes Maternal Grandfather   . Breast cancer Other        second cousins/aunt   Social History   Socioeconomic History  . Marital status: Married    Spouse name: Not on file  . Number of children: Not on file  . Years of education: Not on file  . Highest education level: Not on file  Occupational History  . Not on file  Social Needs  . Financial resource strain: Not on file  . Food insecurity    Worry: Not on file    Inability: Not on file  . Transportation needs    Medical: Not on file    Non-medical: Not on file  Tobacco Use  . Smoking status: Never Smoker  . Smokeless tobacco: Never Used  Substance and Sexual Activity  . Alcohol use: No    Alcohol/week: 0.0 standard drinks  . Drug use: No  . Sexual activity: Not on file  Lifestyle  .  Physical activity    Days per week: Not on file    Minutes per session: Not on file  . Stress: Not on file  Relationships  . Social Herbalist on phone: Not on file    Gets together: Not on file    Attends religious service: Not on file    Active member of club or organization: Not on file    Attends meetings of clubs or organizations: Not on file    Relationship status: Not on file  Other Topics Concern  . Not on file  Social History Narrative  . Not on file    Outpatient Encounter Medications as of 09/17/2019  Medication Sig  . hydrocortisone (ANUSOL-HC) 25 MG suppository Place 1 suppository (25 mg total) rectally 2 (two) times daily.  . nitrofurantoin, macrocrystal-monohydrate, (MACROBID) 100 MG capsule Take 1 capsule (100 mg total) by mouth 2 (two) times daily.  . rosuvastatin (CRESTOR) 10 MG tablet TAKE ONE TABLET BY MOUTH ONE TIME DAILY   No facility-administered encounter medications on file as of 09/17/2019.    Review of Systems  Constitutional: Negative for appetite change and unexpected  weight change.  HENT: Negative for congestion and sinus pressure.   Eyes: Negative for pain and visual disturbance.  Respiratory: Negative for cough, chest tightness and shortness of breath.   Cardiovascular: Negative for chest pain, palpitations and leg swelling.  Gastrointestinal: Negative for abdominal pain, diarrhea, nausea and vomiting.  Genitourinary: Positive for vaginal bleeding. Negative for difficulty urinating and dysuria.  Musculoskeletal: Negative for joint swelling and myalgias.  Skin: Negative for color change and rash.  Neurological: Negative for dizziness, light-headedness and headaches.  Hematological: Negative for adenopathy. Does not bruise/bleed easily.  Psychiatric/Behavioral: Negative for agitation and dysphoric mood.       Objective:    Physical Exam Constitutional:      General: She is not in acute distress.    Appearance: Normal appearance.  She is well-developed.  HENT:     Head: Normocephalic and atraumatic.     Right Ear: External ear normal.     Left Ear: External ear normal.  Eyes:     General: No scleral icterus.       Right eye: No discharge.        Left eye: No discharge.     Conjunctiva/sclera: Conjunctivae normal.  Neck:     Musculoskeletal: Neck supple. No muscular tenderness.     Thyroid: No thyromegaly.  Cardiovascular:     Rate and Rhythm: Normal rate and regular rhythm.  Pulmonary:     Effort: No tachypnea, accessory muscle usage or respiratory distress.     Breath sounds: Normal breath sounds. No decreased breath sounds or wheezing.  Chest:     Breasts:        Right: No inverted nipple, mass, nipple discharge or tenderness (no axillary adenopathy).        Left: No inverted nipple, mass, nipple discharge or tenderness (no axilarry adenopathy).  Abdominal:     General: Bowel sounds are normal.     Palpations: Abdomen is soft.     Tenderness: There is no abdominal tenderness.  Genitourinary:    Comments: Declined.  Wanted to wait for gyn exam to avoid two exams.  Musculoskeletal:        General: No swelling or tenderness.  Lymphadenopathy:     Cervical: No cervical adenopathy.  Skin:    Findings: No erythema or rash.  Neurological:     Mental Status: She is alert and oriented to person, place, and time.  Psychiatric:        Mood and Affect: Mood normal.        Behavior: Behavior normal.     BP 124/70   Pulse 71   Temp (!) 97.1 F (36.2 C)   Resp 16   Ht 4\' 11"  (1.499 m)   Wt 160 lb (72.6 kg)   LMP 06/22/2015 (Approximate)   SpO2 98%   BMI 32.32 kg/m  Wt Readings from Last 3 Encounters:  09/17/19 160 lb (72.6 kg)  05/12/19 161 lb 12.8 oz (73.4 kg)  09/12/18 160 lb 3.2 oz (72.7 kg)     Lab Results  Component Value Date   WBC 6.4 09/17/2019   HGB 14.5 09/17/2019   HCT 42.1 09/17/2019   PLT 292.0 09/17/2019   GLUCOSE 89 09/17/2019   CHOL 162 09/17/2019   TRIG 60.0 09/17/2019    HDL 51.00 09/17/2019   LDLCALC 99 09/17/2019   ALT 25 09/17/2019   AST 19 09/17/2019   NA 140 09/17/2019   K 3.9 09/17/2019   CL 105 09/17/2019   CREATININE 0.65  09/17/2019   BUN 11 09/17/2019   CO2 28 09/17/2019   TSH 1.53 09/17/2019    Mm 3d Screen Breast Bilateral  Result Date: 08/08/2019 CLINICAL DATA:  Screening. EXAM: DIGITAL SCREENING BILATERAL MAMMOGRAM WITH TOMO AND CAD COMPARISON:  Previous exam(s). ACR Breast Density Category c: The breast tissue is heterogeneously dense, which may obscure small masses. FINDINGS: There are no findings suspicious for malignancy. Images were processed with CAD. IMPRESSION: No mammographic evidence of malignancy. A result letter of this screening mammogram will be mailed directly to the patient. RECOMMENDATION: Screening mammogram in one year. (Code:SM-B-01Y) BI-RADS CATEGORY  1: Negative. Electronically Signed   By: Margarette Canada M.D.   On: 08/08/2019 09:15       Assessment & Plan:   Problem List Items Addressed This Visit    Health care maintenance    Physical today 09/17/19.  PAP 09/11/17 - negative with negative HPV.  Mammogram 08/08/19 - Birads I.  Colonoscopy 07/2009 - internal hemorrhoids.  Due f/u colonoscopy.  Agreeable for referral to GI.       Hypercholesterolemia - Primary    On crestor.  Low cholesterol diet and exercise.  Follow lipid panel and liver function tests.        Relevant Orders   CBC with Differential/Platelet (Completed)   Hepatic function panel (Completed)   Lipid panel (Completed)   TSH (Completed)   Basic metabolic panel (Completed)   Postmenopausal bleeding    Vaginal bleeding as outlined.  No period for two years and had vaginal bleeding two weeks ago. Discussed further w/up and evaluation.  Refer to gyn.        Relevant Orders   Ambulatory referral to Gynecology   Rash    Persistent rash on face/forehead.  Refer to dermatology.  Rash on neck improved with cortisone cream (otc).  Follow.        Relevant  Orders   Ambulatory referral to Dermatology    Other Visit Diagnoses    Colon cancer screening       Relevant Orders   Ambulatory referral to Gastroenterology       Einar Pheasant, MD

## 2019-09-17 NOTE — Assessment & Plan Note (Addendum)
Physical today 09/17/19.  PAP 09/11/17 - negative with negative HPV.  Mammogram 08/08/19 - Birads I.  Colonoscopy 07/2009 - internal hemorrhoids.  Due f/u colonoscopy.  Agreeable for referral to GI.

## 2019-09-18 LAB — TSH: TSH: 1.53 u[IU]/mL (ref 0.35–4.50)

## 2019-09-20 ENCOUNTER — Encounter: Payer: Self-pay | Admitting: Internal Medicine

## 2019-09-21 NOTE — Assessment & Plan Note (Signed)
Vaginal bleeding as outlined.  No period for two years and had vaginal bleeding two weeks ago. Discussed further w/up and evaluation.  Refer to gyn.

## 2019-09-21 NOTE — Assessment & Plan Note (Signed)
On crestor.  Low cholesterol diet and exercise.  Follow lipid panel and liver function tests.   

## 2019-09-21 NOTE — Assessment & Plan Note (Signed)
Persistent rash on face/forehead.  Refer to dermatology.  Rash on neck improved with cortisone cream (otc).  Follow.

## 2019-09-26 ENCOUNTER — Telehealth: Payer: Self-pay

## 2019-09-26 ENCOUNTER — Other Ambulatory Visit: Payer: Self-pay

## 2019-09-26 DIAGNOSIS — Z1211 Encounter for screening for malignant neoplasm of colon: Secondary | ICD-10-CM

## 2019-09-26 NOTE — Telephone Encounter (Signed)
Gastroenterology Pre-Procedure Review  Request Date: Thursday 11/19 Requesting Physician: Dr. Bonna Gains  PATIENT REVIEW QUESTIONS: The patient responded to the following health history questions as indicated:    1. Are you having any GI issues? no 2. Do you have a personal history of Polyps? no 3. Do you have a family history of Colon Cancer or Polyps? no 4. Diabetes Mellitus? no 5. Joint replacements in the past 12 months?no 6. Major health problems in the past 3 months?no 7. Any artificial heart valves, MVP, or defibrillator?no    MEDICATIONS & ALLERGIES:    Patient reports the following regarding taking any anticoagulation/antiplatelet therapy:   Plavix, Coumadin, Eliquis, Xarelto, Lovenox, Pradaxa, Brilinta, or Effient? no Aspirin? no  Patient confirms/reports the following medications:  Current Outpatient Medications  Medication Sig Dispense Refill  . hydrocortisone (ANUSOL-HC) 25 MG suppository Place 1 suppository (25 mg total) rectally 2 (two) times daily. 14 suppository 0  . nitrofurantoin, macrocrystal-monohydrate, (MACROBID) 100 MG capsule Take 1 capsule (100 mg total) by mouth 2 (two) times daily. 10 capsule 0  . rosuvastatin (CRESTOR) 10 MG tablet TAKE ONE TABLET BY MOUTH ONE TIME DAILY 30 tablet 11   No current facility-administered medications for this visit.     Patient confirms/reports the following allergies:  No Known Allergies  No orders of the defined types were placed in this encounter.   AUTHORIZATION INFORMATION Primary Insurance: 1D#: Group #:  Secondary Insurance: 1D#: Group #:  SCHEDULE INFORMATION: Date: Thursday 10/09/19 Time: Location:[facility]

## 2019-10-03 ENCOUNTER — Telehealth: Payer: Self-pay

## 2019-10-03 MED ORDER — NA SULFATE-K SULFATE-MG SULF 17.5-3.13-1.6 GM/177ML PO SOLN: 354.0000 mL | Freq: Once | ORAL | 0 refills | Status: AC

## 2019-10-03 NOTE — Telephone Encounter (Signed)
Called patient to confirm patient procedure appointment and to remind patient that they have to go for a COVID test on 10/06/2019.  Patient verbalized understanding Patient states she still needed the prep sent to the pharmacy. Sent this to the pharmacy

## 2019-10-06 ENCOUNTER — Other Ambulatory Visit
Admission: RE | Admit: 2019-10-06 | Discharge: 2019-10-06 | Disposition: A | Discharge status: Home / Self Care | Payer: 59 | Source: Ambulatory Visit | Attending: Gastroenterology | Admitting: Gastroenterology

## 2019-10-06 ENCOUNTER — Other Ambulatory Visit: Payer: Self-pay

## 2019-10-06 DIAGNOSIS — Z20828 Contact with and (suspected) exposure to other viral communicable diseases: Secondary | ICD-10-CM | POA: Insufficient documentation

## 2019-10-06 DIAGNOSIS — Z01812 Encounter for preprocedural laboratory examination: Secondary | ICD-10-CM | POA: Insufficient documentation

## 2019-10-06 LAB — SARS CORONAVIRUS 2 (TAT 6-24 HRS): SARS Coronavirus 2: NEGATIVE

## 2019-10-07 ENCOUNTER — Telehealth: Payer: Self-pay | Admitting: Gastroenterology

## 2019-10-07 ENCOUNTER — Encounter: Payer: Self-pay | Admitting: Internal Medicine

## 2019-10-07 NOTE — Telephone Encounter (Signed)
Pt left vm she is having a procedure on Thurday and would like to know if she can have uddles and noddles with Soy sauce

## 2019-10-08 ENCOUNTER — Telehealth: Payer: Self-pay

## 2019-10-08 NOTE — Telephone Encounter (Signed)
Patient has been advised if she ate her uddles and noodles yesterday that was fine, but today she should only eat clear liquids avoiding foods or beverages that contain red or purple food coloring.  Thanks [employer]

## 2019-10-08 NOTE — Telephone Encounter (Signed)
Patient is currently at the hospital experiencing chest pain.  Her colonoscopy has been canceled for tomorrow for this reason.  Trish in Endo has been informed.  Pt has been asked to call back when she is feeling better to reschedule.  No Cancellation Fee.  Thanks [employer]

## 2019-10-09 ENCOUNTER — Encounter: Admission: RE | Payer: Self-pay | Source: Home / Self Care

## 2019-10-09 ENCOUNTER — Ambulatory Visit: Admission: RE | Admit: 2019-10-09 | Payer: 59 | Source: Home / Self Care | Admitting: Gastroenterology

## 2019-10-09 SURGERY — COLONOSCOPY WITH PROPOFOL
Anesthesia: General

## 2019-10-09 MED ORDER — ZOLPIDEM TARTRATE 5 MG PO TABS
5.00 | ORAL_TABLET | ORAL | Status: DC
Start: ? — End: 2019-10-09

## 2019-10-09 MED ORDER — GENERIC EXTERNAL MEDICATION
30.00 | Status: DC
Start: ? — End: 2019-10-09

## 2019-10-09 MED ORDER — ROSUVASTATIN CALCIUM 5 MG PO TABS
10.00 | ORAL_TABLET | ORAL | Status: DC
Start: 2019-10-09 — End: 2019-10-09

## 2019-10-09 MED ORDER — MAGNESIUM HYDROXIDE 400 MG/5ML PO SUSP
30.00 | ORAL | Status: DC
Start: ? — End: 2019-10-09

## 2019-10-09 MED ORDER — ONDANSETRON 4 MG PO TBDP
4.00 | ORAL_TABLET | ORAL | Status: DC
Start: ? — End: 2019-10-09

## 2019-10-09 MED ORDER — NITROGLYCERIN 0.4 MG SL SUBL
0.40 | SUBLINGUAL_TABLET | SUBLINGUAL | Status: DC
Start: ? — End: 2019-10-09

## 2019-10-09 MED ORDER — FAMOTIDINE 20 MG PO TABS
20.00 | ORAL_TABLET | ORAL | Status: DC
Start: 2019-10-09 — End: 2019-10-09

## 2019-10-09 MED ORDER — ASPIRIN EC 325 MG PO TBEC
325.00 | DELAYED_RELEASE_TABLET | ORAL | Status: DC
Start: 2019-10-10 — End: 2019-10-09

## 2019-10-09 MED ORDER — ACETAMINOPHEN 325 MG PO TABS
650.00 | ORAL_TABLET | ORAL | Status: DC
Start: ? — End: 2019-10-09

## 2019-10-09 MED ORDER — NITROGLYCERIN 2 % TD OINT
0.50 | TOPICAL_OINTMENT | TRANSDERMAL | Status: DC
Start: 2019-10-09 — End: 2019-10-09

## 2019-10-09 MED ORDER — ALUM HYDROXIDE-MAG CARBONATE 95-358 MG/15ML PO SUSP
30.00 | ORAL | Status: DC
Start: 2019-10-09 — End: 2019-10-09

## 2019-10-30 IMAGING — DX DG SHOULDER 2+V*L*
3 series · 3 of 3 positions shown · non-contrast
Comparison: None.

CLINICAL DATA: Persistent left shoulder pain, no injury.

EXAM:
LEFT SHOULDER - 2+ VIEW

[shoulder (grashey) ap]
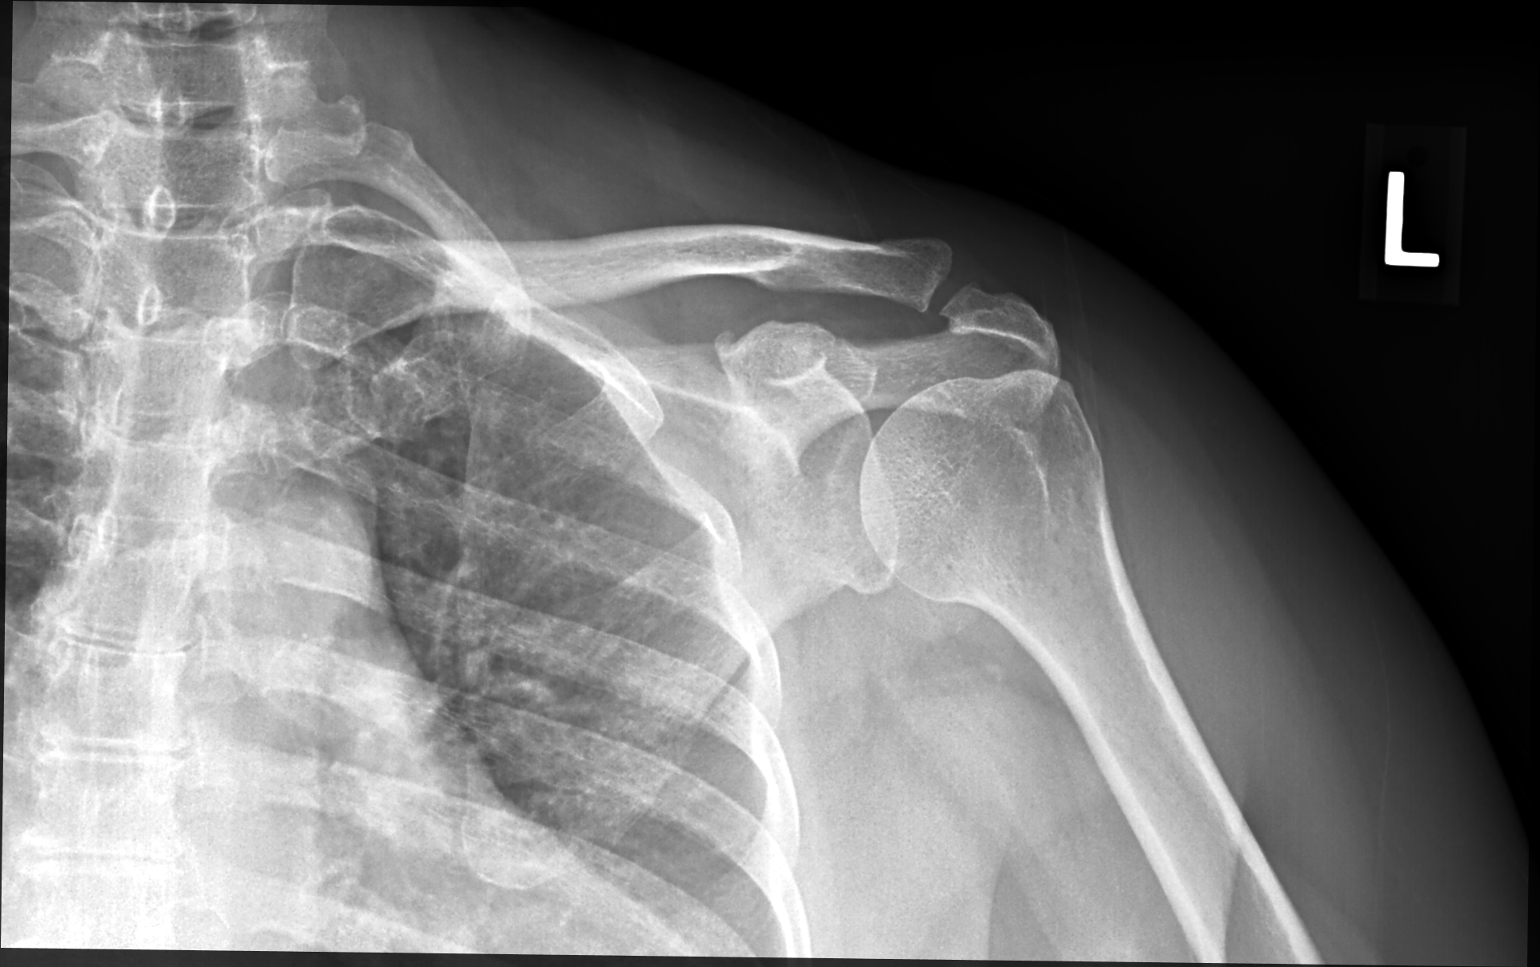

[shoulder y view]
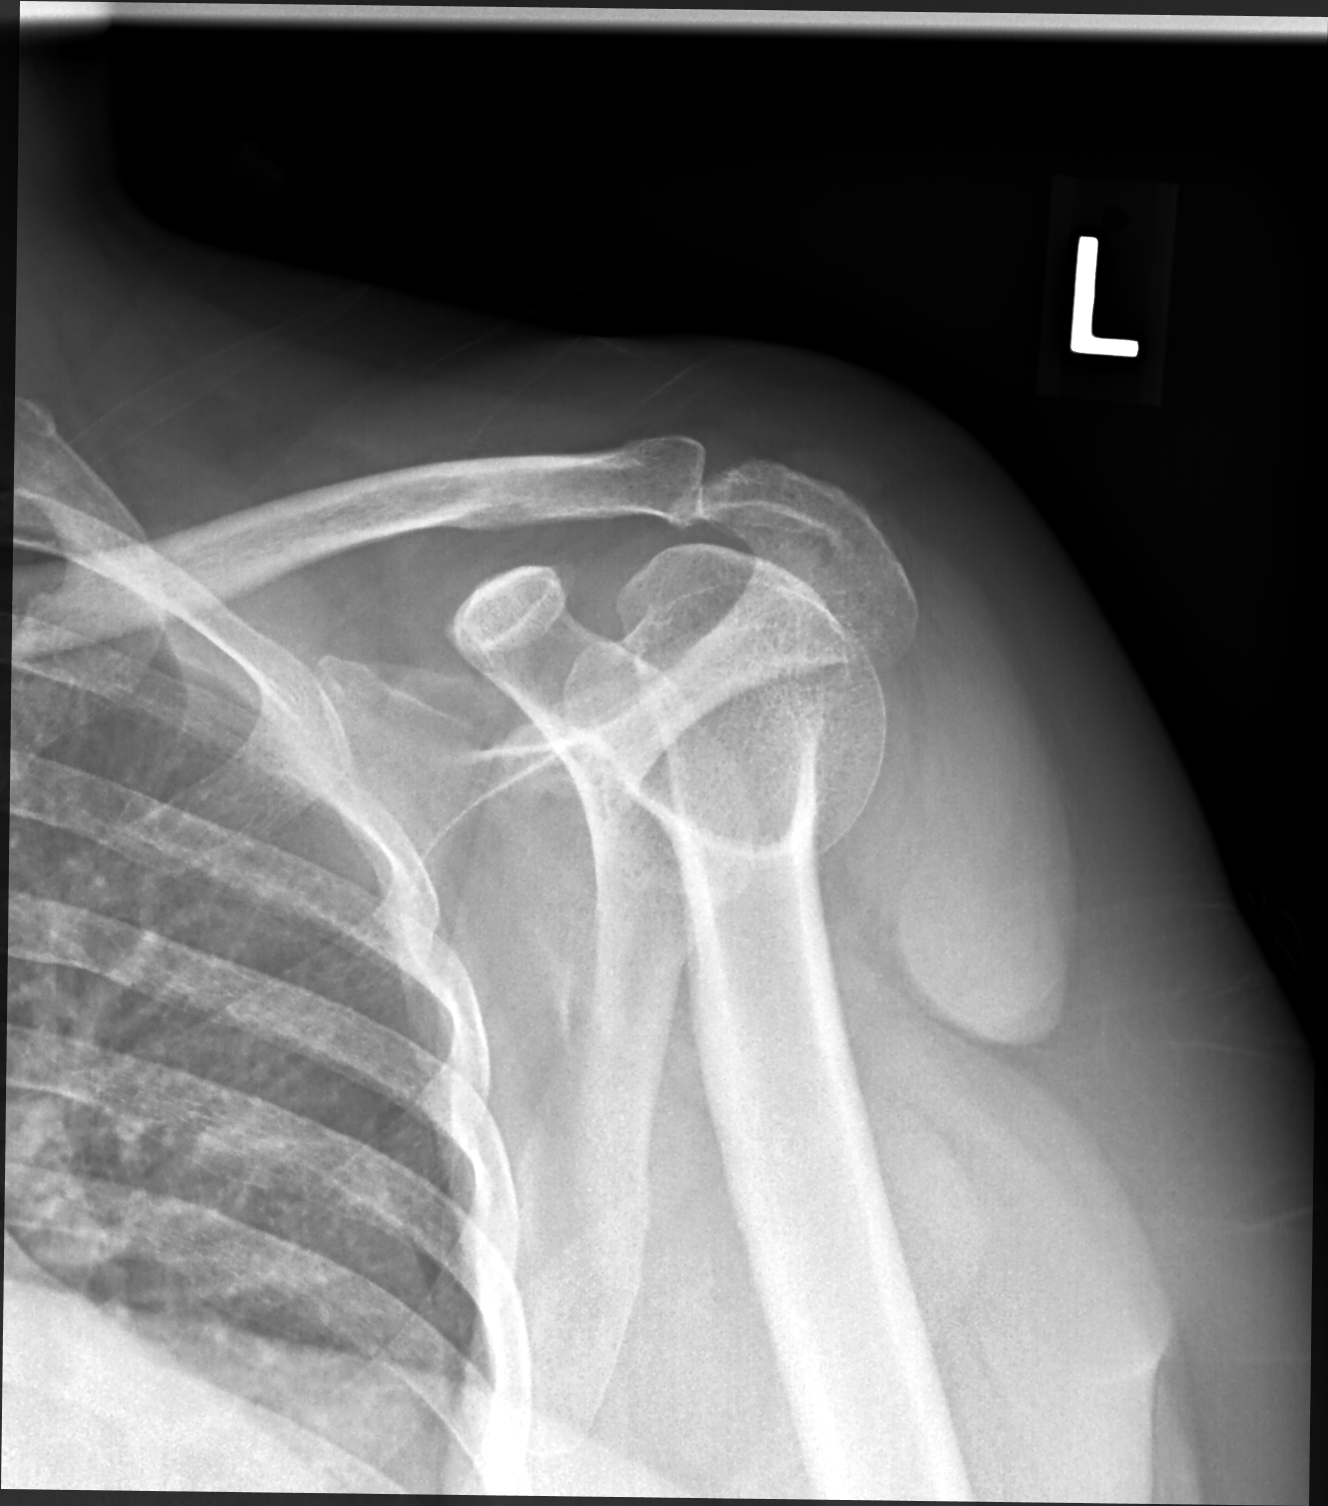

[shoulder axillary pa]
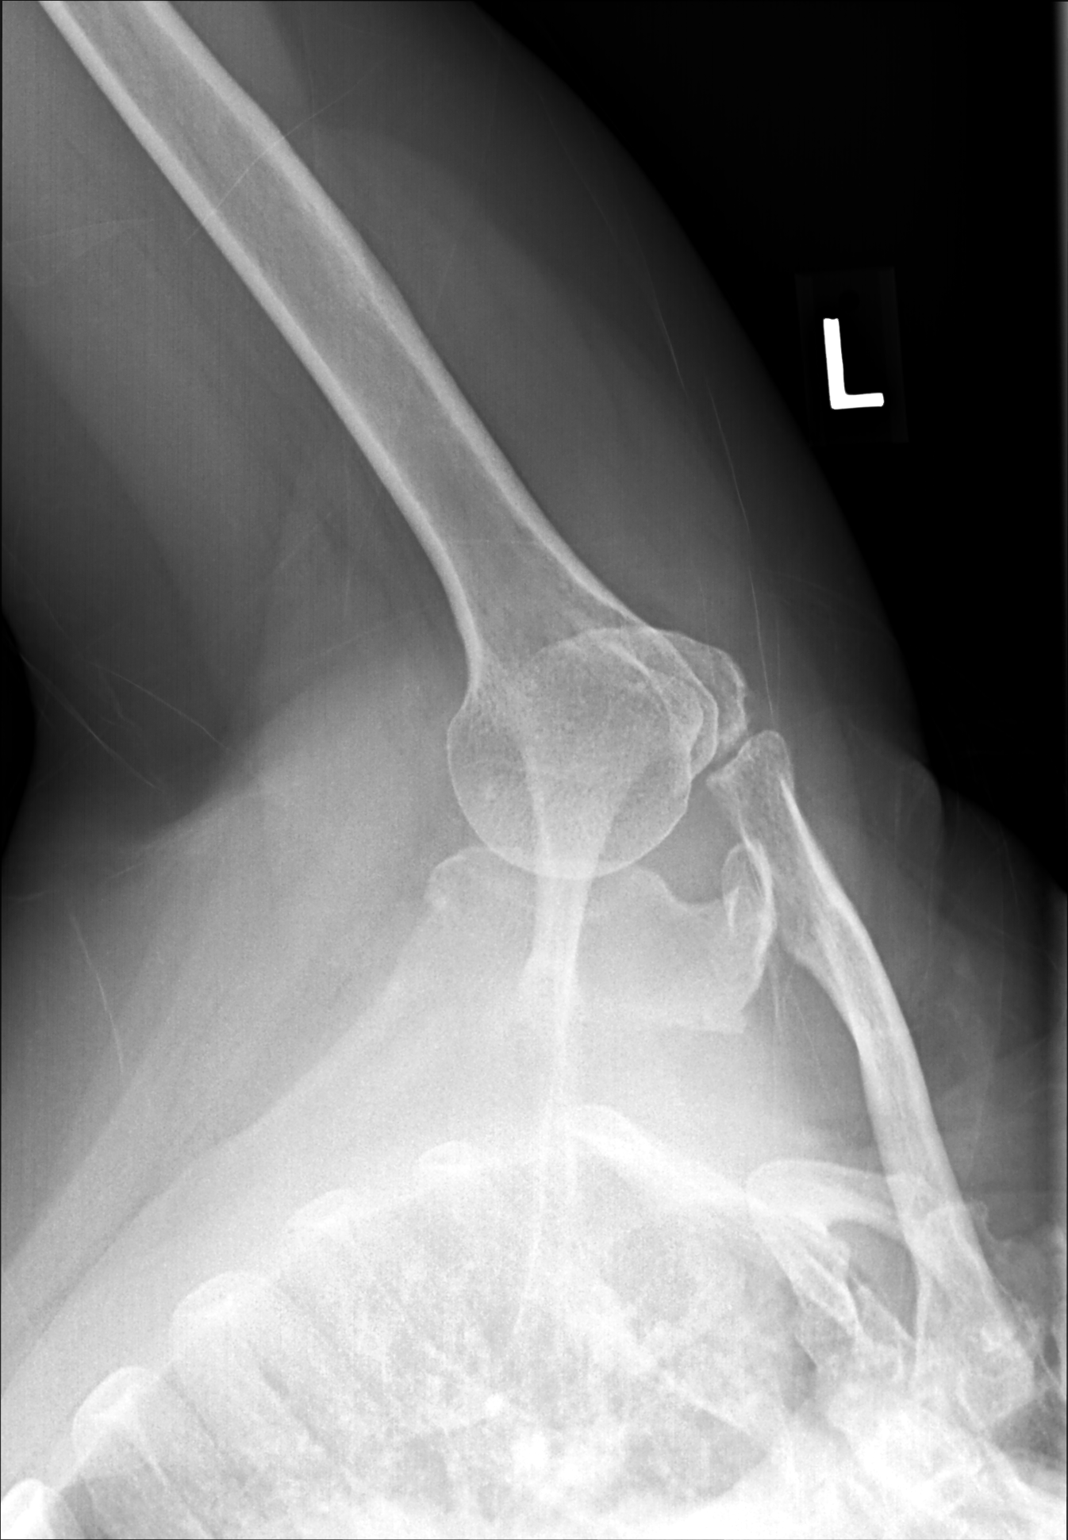

[3 of 3 positions shown; findings below may reference images not displayed]

FINDINGS: Degenerative changes in the acromioclavicular joint. Left shoulder
is otherwise unremarkable. Visualized portion of the left chest is
clear.
IMPRESSION: Left acromioclavicular joint osteoarthritis.

## 2020-02-09 ENCOUNTER — Encounter: Payer: Self-pay | Admitting: Internal Medicine

## 2020-03-16 ENCOUNTER — Encounter: Payer: Self-pay | Admitting: Internal Medicine

## 2020-03-16 ENCOUNTER — Other Ambulatory Visit: Payer: Self-pay | Admitting: Internal Medicine

## 2020-03-25 ENCOUNTER — Encounter: Payer: Self-pay | Admitting: Internal Medicine

## 2020-03-26 ENCOUNTER — Telehealth: Payer: Self-pay | Admitting: Internal Medicine

## 2020-03-26 ENCOUNTER — Other Ambulatory Visit: Payer: Self-pay

## 2020-03-26 ENCOUNTER — Other Ambulatory Visit (INDEPENDENT_AMBULATORY_CARE_PROVIDER_SITE_OTHER): Payer: 59

## 2020-03-26 ENCOUNTER — Telehealth: Payer: Self-pay

## 2020-03-26 DIAGNOSIS — R300 Dysuria: Secondary | ICD-10-CM

## 2020-03-26 LAB — POCT URINALYSIS DIPSTICK
Bilirubin, UA: NEGATIVE
Glucose, UA: NEGATIVE
Ketones, UA: NEGATIVE
Leukocytes, UA: NEGATIVE
Nitrite, UA: NEGATIVE
Protein, UA: NEGATIVE
Spec Grav, UA: 1.02 (ref 1.010–1.025)
Urobilinogen, UA: 0.2 E.U./dL
pH, UA: 5 (ref 5.0–8.0)

## 2020-03-26 LAB — URINALYSIS, MICROSCOPIC ONLY

## 2020-03-26 MED ORDER — NITROFURANTOIN MONOHYD MACRO 100 MG PO CAPS: 100.0000 mg | ORAL_CAPSULE | Freq: Two times a day (BID) | ORAL | 0 refills | Status: DC

## 2020-03-26 NOTE — Telephone Encounter (Signed)
Called patient. Noticed blood in urine x1 yesterday. Burning with urination is only sx noted today. Denies any other symptoms. Had macrobid left at home so started that yesterday. Has 2 pills left. Advised patient that she needs to come in for urine sample. Cannot write rx without urine. Pt is going to try to find transportation to come give urine today. Orders placed.

## 2020-03-26 NOTE — Telephone Encounter (Signed)
See other note

## 2020-03-26 NOTE — Telephone Encounter (Signed)
Pt called said  possible UTI  Her number to call is [phone] she said need medication

## 2020-03-26 NOTE — Telephone Encounter (Signed)
Future orders placed for pt to come in and leave urine sample. See my chart message.

## 2020-03-27 ENCOUNTER — Encounter: Payer: Self-pay | Admitting: Internal Medicine

## 2020-03-27 LAB — URINE CULTURE
MICRO NUMBER:: 10452690
Result:: NO GROWTH
SPECIMEN QUALITY:: ADEQUATE

## 2020-03-29 ENCOUNTER — Ambulatory Visit (INDEPENDENT_AMBULATORY_CARE_PROVIDER_SITE_OTHER): Payer: 59 | Admitting: Internal Medicine

## 2020-03-29 ENCOUNTER — Encounter: Payer: Self-pay | Admitting: Internal Medicine

## 2020-03-29 ENCOUNTER — Other Ambulatory Visit: Payer: Self-pay

## 2020-03-29 DIAGNOSIS — R350 Frequency of micturition: Secondary | ICD-10-CM | POA: Diagnosis not present

## 2020-03-29 DIAGNOSIS — E7800 Pure hypercholesterolemia, unspecified: Secondary | ICD-10-CM | POA: Diagnosis not present

## 2020-03-29 DIAGNOSIS — M79671 Pain in right foot: Secondary | ICD-10-CM | POA: Diagnosis not present

## 2020-03-29 DIAGNOSIS — M79601 Pain in right arm: Secondary | ICD-10-CM

## 2020-03-29 DIAGNOSIS — N950 Postmenopausal bleeding: Secondary | ICD-10-CM

## 2020-03-29 DIAGNOSIS — R319 Hematuria, unspecified: Secondary | ICD-10-CM

## 2020-03-29 DIAGNOSIS — Z1211 Encounter for screening for malignant neoplasm of colon: Secondary | ICD-10-CM

## 2020-03-29 LAB — LIPID PANEL
Cholesterol: 179 mg/dL (ref 0–200)
HDL: 51.4 mg/dL (ref >39.00)
LDL Cholesterol: 115 mg/dL — ABNORMAL HIGH (ref 0–99)
NonHDL: 127.33
Total CHOL/HDL Ratio: 3
Triglycerides: 63 mg/dL (ref 0.0–149.0)
VLDL: 12.6 mg/dL (ref 0.0–40.0)

## 2020-03-29 LAB — HEPATIC FUNCTION PANEL
ALT: 15 U/L (ref 0–35)
AST: 14 U/L (ref 0–37)
Albumin: 4.2 g/dL (ref 3.5–5.2)
Alkaline Phosphatase: 58 U/L (ref 39–117)
Bilirubin, Direct: 0.1 mg/dL (ref 0.0–0.3)
Total Bilirubin: 0.3 mg/dL (ref 0.2–1.2)
Total Protein: 7.6 g/dL (ref 6.0–8.3)

## 2020-03-29 LAB — BASIC METABOLIC PANEL
BUN: 13 mg/dL (ref 6–23)
CO2: 28 mEq/L (ref 19–32)
Calcium: 9.4 mg/dL (ref 8.4–10.5)
Chloride: 105 mEq/L (ref 96–112)
Creatinine, Ser: 0.62 mg/dL (ref 0.40–1.20)
GFR: 120.3 mL/min (ref >60.00)
Glucose, Bld: 93 mg/dL (ref 70–99)
Potassium: 4.1 mEq/L (ref 3.5–5.1)
Sodium: 138 mEq/L (ref 135–145)

## 2020-03-29 NOTE — Progress Notes (Signed)
Patient ID: Alison Rodriguez, female   DOB: 10/09/1961, 59 y.o.   MRN: [id number]   Subjective:    Patient ID: Alison Rodriguez, female    DOB: [birth year], 23 y.o.   MRN: [id number]  HPI This visit occurred during the SARS-CoV-2 public health emergency.  Safety protocols were in place, including screening questions prior to the visit, additional usage of staff PPE, and extensive cleaning of exam room while observing appropriate contact time as indicated for disinfecting solutions.  Patient here for a scheduled follow up.  She is here to f/u on her elevated cholesterol.  Recently also had dysuria and urgency with associated hematuria.  She started taking macrobid prior to urine culture.  Culture returned negative.  She reports symptoms have improved.  No significant dysuria now.  Minimal residual lower abdominal pressure, but much improved.  She did have some issues with hemorrhoidal bleeding - with UTI. (streaks of blood on tissue).  Denies any vaginal bleeding.  Was recently scheduled for colonoscopy.  Night before - developed chest tightness and was sob.  Evaluated in ER.  Stayed overnight.  Reports had negative stress test. Has not had any chest pain since.  Has started exercising - working out.  Has noticed some right heel - burning.  Has been present intermittently for two weeks.  No known injury or trauma.  Exercising - helps. Discussed supports.   Also noticed some right elbow/shoulder discomfort recently.  Better with stretches and exercise as well.  Eating.  No nausea or vomiting.  Discussed colonoscopy.  She wants to hold.    Past Medical History:  Diagnosis Date   Blood in stool    H/O   Hyperlipidemia    Hypertension    Thyroid disease    Past Surgical History:  Procedure Laterality Date   UTERINE FIBROID EMBOLIZATION     Family History  Problem Relation Age of Onset   Diabetes Mother    Diabetes Maternal Grandmother    Arthritis Maternal Grandfather    Diabetes Maternal  Grandfather    Breast cancer Other        second cousins/aunt   Social History   Socioeconomic History   Marital status: Married    Spouse name: Not on file   Number of children: Not on file   Years of education: Not on file   Highest education level: Not on file  Occupational History   Not on file  Tobacco Use   Smoking status: Never Smoker   Smokeless tobacco: Never Used  Substance and Sexual Activity   Alcohol use: No    Alcohol/week: 0.0 standard drinks   Drug use: No   Sexual activity: Not on file  Other Topics Concern   Not on file  Social History Narrative   Not on file   Social Determinants of Health   Financial Resource Strain:    Difficulty of Paying Living Expenses:   Food Insecurity:    Worried About Charity fundraiser in the Last Year:    Arboriculturist in the Last Year:   Transportation Needs:    Film/video editor (Medical):    Lack of Transportation (Non-Medical):   Physical Activity:    Days of Exercise per Week:    Minutes of Exercise per Session:   Stress:    Feeling of Stress :   Social Connections:    Frequency of Communication with Friends and Family:    Frequency of Social Gatherings with Friends and Family:  Attends Religious Services:    Active Member of Clubs or Organizations:    Attends Archivist Meetings:    Marital Status:     Outpatient Encounter Medications as of 03/29/2020  Medication Sig   hydrocortisone (ANUSOL-HC) 25 MG suppository Place 1 suppository (25 mg total) rectally 2 (two) times daily.   nitrofurantoin, macrocrystal-monohydrate, (MACROBID) 100 MG capsule Take 1 capsule (100 mg total) by mouth 2 (two) times daily.   rosuvastatin (CRESTOR) 10 MG tablet TAKE 1 TABLET BY MOUTH EVERY DAY   No facility-administered encounter medications on file as of 03/29/2020.    Review of Systems  Constitutional: Negative for appetite change and unexpected weight change.  HENT:  Negative for congestion and sinus pressure.   Respiratory: Negative for cough, chest tightness and shortness of breath.   Cardiovascular: Negative for chest pain, palpitations and leg swelling.  Gastrointestinal: Negative for diarrhea, nausea and vomiting.       Previous lower abdominal pressure.  Better now.    Genitourinary:       Dysuria as outlined.  Better.  Previous hematuria.  None now.  No vaginal itching or discharge.    Musculoskeletal: Negative for joint swelling and myalgias.  Skin: Negative for color change and rash.  Neurological: Negative for dizziness, light-headedness and headaches.  Psychiatric/Behavioral: Negative for agitation and dysphoric mood.       Objective:    Physical Exam Constitutional:      General: She is not in acute distress.    Appearance: Normal appearance.  HENT:     Head: Normocephalic and atraumatic.     Right Ear: External ear normal.     Left Ear: External ear normal.  Eyes:     General: No scleral icterus.       Right eye: No discharge.        Left eye: No discharge.     Conjunctiva/sclera: Conjunctivae normal.  Neck:     Thyroid: No thyromegaly.  Cardiovascular:     Rate and Rhythm: Normal rate and regular rhythm.  Pulmonary:     Effort: No respiratory distress.     Breath sounds: Normal breath sounds. No wheezing.  Abdominal:     General: Bowel sounds are normal.     Palpations: Abdomen is soft.     Comments: No significant tenderness to palpation.    Musculoskeletal:        General: No swelling or tenderness.     Cervical back: Neck supple. No tenderness.     Comments: No tenderness to palpation - heel.  Good rom - shoulder/arm.    Lymphadenopathy:     Cervical: No cervical adenopathy.  Skin:    Findings: No erythema or rash.  Neurological:     Mental Status: She is alert.  Psychiatric:        Mood and Affect: Mood normal.        Behavior: Behavior normal.     BP 118/70    Pulse 79    Temp 97.8 F (36.6 C)    Resp 16     Ht 4\' 11"  (1.499 m)    Wt 163 lb 3.2 oz (74 kg)    LMP 06/22/2015 (Approximate)    SpO2 98%    BMI 32.96 kg/m  Wt Readings from Last 3 Encounters:  03/29/20 163 lb 3.2 oz (74 kg)  09/17/19 160 lb (72.6 kg)  05/12/19 161 lb 12.8 oz (73.4 kg)     Lab Results  Component Value Date  WBC 6.4 09/17/2019   HGB 14.5 09/17/2019   HCT 42.1 09/17/2019   PLT 292.0 09/17/2019   GLUCOSE 93 03/29/2020   CHOL 179 03/29/2020   TRIG 63.0 03/29/2020   HDL 51.40 03/29/2020   LDLCALC 115 (H) 03/29/2020   ALT 15 03/29/2020   AST 14 03/29/2020   NA 138 03/29/2020   K 4.1 03/29/2020   CL 105 03/29/2020   CREATININE 0.62 03/29/2020   BUN 13 03/29/2020   CO2 28 03/29/2020   TSH 1.53 09/17/2019       Assessment & Plan:   Problem List Items Addressed This Visit    Colon cancer screening    Discussed colonoscopy.  She wants to hold right now.  Follow.  Will notify me when agreeable.        Hypercholesterolemia    On crestor.  Low cholesterol diet and exercise.  Follow lipid panel and liver function tests.        Relevant Orders   Hepatic function panel (Completed)   Lipid panel (Completed)   Basic metabolic panel (Completed)   Pain of right heel    No pain on palpation.  Discussed supports.  Follow.        Postmenopausal bleeding    Saw OB/Gyn.  S/p endometrial biopsy.  States ok.  Obtain pathology results.  No further vaginal bleeding reported.        Right arm pain    Good rom.  No pain today.  Wants to follow.        Urinary frequency    Recently had dysuria, frequency and urgency.  Started abx prior to obtaining urine.  Culture negative. Symptoms have improved significantly.  Will continue full course of abx.  Will need to recheck urinalysis in 2 weeks to confirm clear  (no blood, etc).         Other Visit Diagnoses    Hematuria, unspecified type       Relevant Orders   Urinalysis, Routine w reflex microscopic       Einar Pheasant, MD

## 2020-03-30 ENCOUNTER — Encounter: Payer: Self-pay | Admitting: Internal Medicine

## 2020-03-30 DIAGNOSIS — M79671 Pain in right foot: Secondary | ICD-10-CM | POA: Insufficient documentation

## 2020-03-30 DIAGNOSIS — Z1211 Encounter for screening for malignant neoplasm of colon: Secondary | ICD-10-CM | POA: Insufficient documentation

## 2020-03-30 DIAGNOSIS — M79601 Pain in right arm: Secondary | ICD-10-CM | POA: Insufficient documentation

## 2020-03-30 NOTE — Assessment & Plan Note (Signed)
No pain on palpation.  Discussed supports.  Follow.

## 2020-03-30 NOTE — Assessment & Plan Note (Signed)
Recently had dysuria, frequency and urgency.  Started abx prior to obtaining urine.  Culture negative. Symptoms have improved significantly.  Will continue full course of abx.  Will need to recheck urinalysis in 2 weeks to confirm clear  (no blood, etc).

## 2020-03-30 NOTE — Assessment & Plan Note (Signed)
Discussed colonoscopy.  She wants to hold right now.  Follow.  Will notify me when agreeable.

## 2020-03-30 NOTE — Assessment & Plan Note (Signed)
On crestor.  Low cholesterol diet and exercise.  Follow lipid panel and liver function tests.   

## 2020-03-30 NOTE — Assessment & Plan Note (Signed)
Good rom.  No pain today.  Wants to follow.

## 2020-03-30 NOTE — Assessment & Plan Note (Signed)
Saw OB/Gyn.  S/p endometrial biopsy.  States ok.  Obtain pathology results.  No further vaginal bleeding reported.

## 2020-04-05 ENCOUNTER — Other Ambulatory Visit (INDEPENDENT_AMBULATORY_CARE_PROVIDER_SITE_OTHER): Payer: 59

## 2020-04-05 ENCOUNTER — Other Ambulatory Visit: Payer: Self-pay

## 2020-04-05 DIAGNOSIS — R319 Hematuria, unspecified: Secondary | ICD-10-CM | POA: Diagnosis not present

## 2020-04-05 LAB — URINALYSIS, ROUTINE W REFLEX MICROSCOPIC
Bilirubin Urine: NEGATIVE
Ketones, ur: NEGATIVE
Leukocytes,Ua: NEGATIVE
Nitrite: NEGATIVE
Specific Gravity, Urine: >=1.03 — AB (ref 1.000–1.030)
Total Protein, Urine: NEGATIVE
Urine Glucose: NEGATIVE
Urobilinogen, UA: 0.2 (ref 0.0–1.0)
pH: 6 (ref 5.0–8.0)

## 2020-07-21 ENCOUNTER — Other Ambulatory Visit: Payer: Self-pay | Admitting: Internal Medicine

## 2020-07-21 DIAGNOSIS — Z1231 Encounter for screening mammogram for malignant neoplasm of breast: Secondary | ICD-10-CM

## 2020-08-05 ENCOUNTER — Ambulatory Visit: Payer: 59

## 2020-08-09 ENCOUNTER — Other Ambulatory Visit: Payer: Self-pay

## 2020-08-09 ENCOUNTER — Ambulatory Visit
Admission: RE | Admit: 2020-08-09 | Discharge: 2020-08-09 | Disposition: A | Discharge status: Home / Self Care | Payer: 59 | Source: Ambulatory Visit | Attending: Internal Medicine | Admitting: Internal Medicine

## 2020-08-09 DIAGNOSIS — Z1231 Encounter for screening mammogram for malignant neoplasm of breast: Secondary | ICD-10-CM

## 2020-10-04 ENCOUNTER — Other Ambulatory Visit (HOSPITAL_COMMUNITY)
Admission: RE | Admit: 2020-10-04 | Discharge: 2020-10-04 | Disposition: A | Discharge status: Home / Self Care | Payer: 59 | Source: Ambulatory Visit | Attending: Internal Medicine | Admitting: Internal Medicine

## 2020-10-04 ENCOUNTER — Encounter: Payer: Self-pay | Admitting: Internal Medicine

## 2020-10-04 ENCOUNTER — Ambulatory Visit (INDEPENDENT_AMBULATORY_CARE_PROVIDER_SITE_OTHER): Payer: 59 | Admitting: Internal Medicine

## 2020-10-04 ENCOUNTER — Other Ambulatory Visit: Payer: Self-pay

## 2020-10-04 VITALS — BP 128/76 | HR 70 | Temp 98.0°F | Resp 16 | Ht 59.0 in | Wt 167.0 lb

## 2020-10-04 DIAGNOSIS — Z124 Encounter for screening for malignant neoplasm of cervix: Secondary | ICD-10-CM

## 2020-10-04 DIAGNOSIS — R059 Cough, unspecified: Secondary | ICD-10-CM

## 2020-10-04 DIAGNOSIS — Z0000 Encounter for general adult medical examination without abnormal findings: Secondary | ICD-10-CM | POA: Diagnosis not present

## 2020-10-04 DIAGNOSIS — Z23 Encounter for immunization: Secondary | ICD-10-CM

## 2020-10-04 DIAGNOSIS — E7800 Pure hypercholesterolemia, unspecified: Secondary | ICD-10-CM

## 2020-10-04 MED ORDER — FAMOTIDINE 20 MG PO TABS
ORAL_TABLET | ORAL | 2 refills | Status: DC
Start: 2020-10-04 — End: 2020-12-28

## 2020-10-04 NOTE — Progress Notes (Signed)
Patient ID: Alison Rodriguez, female   DOB: April 30, 1961, 59 y.o.   MRN: [id number]   Subjective:    Patient ID: Alison Rodriguez, female    DOB: 1961/02/20, 59 y.o.   MRN: [id number]  HPI This visit occurred during the SARS-CoV-2 public health emergency.  Safety protocols were in place, including screening questions prior to the visit, additional usage of staff PPE, and extensive cleaning of exam room while observing appropriate contact time as indicated for disinfecting solutions.  Patient here for her physical exam.  She is doing well.  Feels good.  Trying to stay active.  No chest pain or sob.  She does report cough.  Started two weeks ago.  Occurs at night when she lies down. No fever.  No chest congestion or chest tightness.  Took pepcid last night.  No cough.  No abdominal pain.  Bowels moving.  Occasional problems with hemorrhoids.  Just irritation.  No rectal bleeding.  No vaginal bleeding.     Past Medical History:  Diagnosis Date  . Blood in stool    H/O  . Hyperlipidemia   . Hypertension   . Thyroid disease    Past Surgical History:  Procedure Laterality Date  . UTERINE FIBROID EMBOLIZATION     Family History  Problem Relation Age of Onset  . Diabetes Mother   . Diabetes Maternal Grandmother   . Arthritis Maternal Grandfather   . Diabetes Maternal Grandfather   . Breast cancer Other        second cousins/aunt   Social History   Socioeconomic History  . Marital status: Married    Spouse name: Not on file  . Number of children: Not on file  . Years of education: Not on file  . Highest education level: Not on file  Occupational History  . Not on file  Tobacco Use  . Smoking status: Never Smoker  . Smokeless tobacco: Never Used  Substance and Sexual Activity  . Alcohol use: No    Alcohol/week: 0.0 standard drinks  . Drug use: No  . Sexual activity: Not on file  Other Topics Concern  . Not on file  Social History Narrative  . Not on file   Social Determinants  of Health   Financial Resource Strain:   . Difficulty of Paying Living Expenses: Not on file  Food Insecurity:   . Worried About Charity fundraiser in the Last Year: Not on file  . Ran Out of Food in the Last Year: Not on file  Transportation Needs:   . Lack of Transportation (Medical): Not on file  . Lack of Transportation (Non-Medical): Not on file  Physical Activity:   . Days of Exercise per Week: Not on file  . Minutes of Exercise per Session: Not on file  Stress:   . Feeling of Stress : Not on file  Social Connections:   . Frequency of Communication with Friends and Family: Not on file  . Frequency of Social Gatherings with Friends and Family: Not on file  . Attends Religious Services: Not on file  . Active Member of Clubs or Organizations: Not on file  . Attends Archivist Meetings: Not on file  . Marital Status: Not on file    Outpatient Encounter Medications as of 10/04/2020  Medication Sig  . famotidine (PEPCID) 20 MG tablet Take one tablet daily 30 minutes before your evening meal  . [DISCONTINUED] famotidine (PEPCID) 20 MG tablet Take 20 mg by mouth daily as needed for  heartburn or indigestion.  . hydrocortisone (ANUSOL-HC) 25 MG suppository Place 1 suppository (25 mg total) rectally 2 (two) times daily.  . rosuvastatin (CRESTOR) 10 MG tablet TAKE 1 TABLET BY MOUTH EVERY DAY  . [DISCONTINUED] nitrofurantoin, macrocrystal-monohydrate, (MACROBID) 100 MG capsule Take 1 capsule (100 mg total) by mouth 2 (two) times daily.   No facility-administered encounter medications on file as of 10/04/2020.    Review of Systems  Constitutional: Negative for appetite change and unexpected weight change.  HENT: Negative for congestion, sinus pressure and sore throat.   Eyes: Negative for pain and visual disturbance.  Respiratory: Positive for cough. Negative for chest tightness and shortness of breath.   Cardiovascular: Negative for chest pain, palpitations and leg  swelling.  Gastrointestinal: Negative for abdominal pain, diarrhea, nausea and vomiting.  Genitourinary: Negative for difficulty urinating and dysuria.  Musculoskeletal: Negative for joint swelling and myalgias.  Skin: Negative for color change and rash.  Neurological: Negative for dizziness, light-headedness and headaches.  Hematological: Negative for adenopathy. Does not bruise/bleed easily.  Psychiatric/Behavioral: Negative for agitation and dysphoric mood.       Objective:    Physical Exam Vitals reviewed.  Constitutional:      General: She is not in acute distress.    Appearance: Normal appearance. She is well-developed.  HENT:     Head: Normocephalic and atraumatic.     Right Ear: External ear normal.     Left Ear: External ear normal.  Eyes:     General: No scleral icterus.       Right eye: No discharge.        Left eye: No discharge.     Conjunctiva/sclera: Conjunctivae normal.  Neck:     Thyroid: No thyromegaly.  Cardiovascular:     Rate and Rhythm: Normal rate and regular rhythm.  Pulmonary:     Effort: No tachypnea, accessory muscle usage or respiratory distress.     Breath sounds: Normal breath sounds. No decreased breath sounds or wheezing.  Chest:     Breasts:        Right: No inverted nipple, mass, nipple discharge or tenderness (no axillary adenopathy).        Left: No inverted nipple, mass, nipple discharge or tenderness (no axilarry adenopathy).  Abdominal:     General: Bowel sounds are normal.     Palpations: Abdomen is soft.     Tenderness: There is no abdominal tenderness.  Genitourinary:    Comments: Normal external genitalia.  Vaginal vault without lesions.  Cervix identified.  Pap smear performed.  Could not appreciate any adnexal masses or tenderness.   Musculoskeletal:        General: No swelling or tenderness.     Cervical back: Neck supple. No tenderness.  Lymphadenopathy:     Cervical: No cervical adenopathy.  Skin:    Findings: No  erythema or rash.  Neurological:     Mental Status: She is alert and oriented to person, place, and time.  Psychiatric:        Mood and Affect: Mood normal.        Behavior: Behavior normal.     BP 128/76   Pulse 70   Temp 98 F (36.7 C) (Oral)   Resp 16   Ht 4\' 11"  (1.499 m)   Wt 167 lb (75.8 kg)   LMP 06/22/2015 (Approximate)   SpO2 98%   BMI 33.73 kg/m  Wt Readings from Last 3 Encounters:  10/04/20 167 lb (75.8 kg)  03/29/20 163  lb 3.2 oz (74 kg)  09/17/19 160 lb (72.6 kg)     Lab Results  Component Value Date   WBC 6.0 10/06/2020   HGB 14.2 10/06/2020   HCT 43.5 10/06/2020   PLT 273.0 10/06/2020   GLUCOSE 84 10/07/2020   CHOL 154 10/06/2020   TRIG 72.0 10/06/2020   HDL 50.20 10/06/2020   LDLCALC 90 10/06/2020   ALT 17 10/06/2020   AST 15 10/06/2020   NA 141 10/07/2020   K 4.0 10/07/2020   CL 104 10/07/2020   CREATININE 0.65 10/07/2020   BUN 9 10/07/2020   CO2 25 10/07/2020   TSH 1.42 10/06/2020    MM 3D SCREEN BREAST BILATERAL  Result Date: 08/10/2020 CLINICAL DATA:  Screening. EXAM: DIGITAL SCREENING BILATERAL MAMMOGRAM WITH TOMO AND CAD COMPARISON:  Previous exam(s). ACR Breast Density Category c: The breast tissue is heterogeneously dense, which may obscure small masses. FINDINGS: There are no findings suspicious for malignancy. Images were processed with CAD. IMPRESSION: No mammographic evidence of malignancy. A result letter of this screening mammogram will be mailed directly to the patient. RECOMMENDATION: Screening mammogram in one year. (Code:SM-B-01Y) BI-RADS CATEGORY  1: Negative. Electronically Signed   By: Lajean Manes M.D.   On: 08/10/2020 14:44       Assessment & Plan:   Problem List Items Addressed This Visit    Hypercholesterolemia - Primary    On crestor.  Low cholesterol diet and exercise.  Follow lipid panel and liver function tests.        Relevant Orders   CBC with Differential/Platelet (Completed)   Hepatic function panel  (Completed)   Lipid panel (Completed)   TSH (Completed)   Health care maintenance    Physical today 10/04/20.  PAP 10/04/20.  Mammogram 08/10/20 - Birads I.  Colonoscopy - last recorded colonoscopy 2010.  Had discussed f/u colonoscopy last visit - see note.         Cough    Cough at night as outlined.  Treat with pepcid.  Follow.  Get her back in soon to reassess.  Notify me if persistent or change in symptoms.        Other Visit Diagnoses    Cervical cancer screening       Relevant Orders   Cytology - PAP( [place]) (Completed)   Need for immunization against influenza       Relevant Orders   Flu Vaccine QUAD 36+ mos IM (Completed)       Einar Pheasant, MD

## 2020-10-06 ENCOUNTER — Other Ambulatory Visit (INDEPENDENT_AMBULATORY_CARE_PROVIDER_SITE_OTHER): Payer: 59

## 2020-10-06 ENCOUNTER — Other Ambulatory Visit: Payer: Self-pay

## 2020-10-06 DIAGNOSIS — E7800 Pure hypercholesterolemia, unspecified: Secondary | ICD-10-CM | POA: Diagnosis not present

## 2020-10-06 LAB — HEPATIC FUNCTION PANEL
ALT: 17 U/L (ref 0–35)
AST: 15 U/L (ref 0–37)
Albumin: 4 g/dL (ref 3.5–5.2)
Alkaline Phosphatase: 59 U/L (ref 39–117)
Bilirubin, Direct: 0.1 mg/dL (ref 0.0–0.3)
Total Bilirubin: 0.4 mg/dL (ref 0.2–1.2)
Total Protein: 7.1 g/dL (ref 6.0–8.3)

## 2020-10-06 LAB — CBC WITH DIFFERENTIAL/PLATELET
Basophils Absolute: 0 10*3/uL (ref 0.0–0.1)
Basophils Relative: 0.8 % (ref 0.0–3.0)
Eosinophils Absolute: 0.2 10*3/uL (ref 0.0–0.7)
Eosinophils Relative: 2.6 % (ref 0.0–5.0)
HCT: 43.5 % (ref 36.0–46.0)
Hemoglobin: 14.2 g/dL (ref 12.0–15.0)
Lymphocytes Relative: 32.1 % (ref 12.0–46.0)
Lymphs Abs: 1.9 10*3/uL (ref 0.7–4.0)
MCHC: 32.7 g/dL (ref 30.0–36.0)
MCV: 87.2 fl (ref 78.0–100.0)
Monocytes Absolute: 0.6 10*3/uL (ref 0.1–1.0)
Monocytes Relative: 9.4 % (ref 3.0–12.0)
Neutro Abs: 3.3 10*3/uL (ref 1.4–7.7)
Neutrophils Relative %: 55.1 % (ref 43.0–77.0)
Platelets: 273 10*3/uL (ref 150.0–400.0)
RBC: 4.98 Mil/uL (ref 3.87–5.11)
RDW: 14.2 % (ref 11.5–15.5)
WBC: 6 10*3/uL (ref 4.0–10.5)

## 2020-10-06 LAB — LIPID PANEL
Cholesterol: 154 mg/dL (ref 0–200)
HDL: 50.2 mg/dL (ref >39.00)
LDL Cholesterol: 90 mg/dL (ref 0–99)
NonHDL: 104.13
Total CHOL/HDL Ratio: 3
Triglycerides: 72 mg/dL (ref 0.0–149.0)
VLDL: 14.4 mg/dL (ref 0.0–40.0)

## 2020-10-06 LAB — TSH: TSH: 1.42 u[IU]/mL (ref 0.35–4.50)

## 2020-10-07 ENCOUNTER — Other Ambulatory Visit (INDEPENDENT_AMBULATORY_CARE_PROVIDER_SITE_OTHER): Payer: 59

## 2020-10-07 ENCOUNTER — Other Ambulatory Visit: Payer: Self-pay | Admitting: Internal Medicine

## 2020-10-07 DIAGNOSIS — E7800 Pure hypercholesterolemia, unspecified: Secondary | ICD-10-CM | POA: Diagnosis not present

## 2020-10-07 LAB — CYTOLOGY - PAP
Comment: NEGATIVE
Diagnosis: NEGATIVE
High risk HPV: NEGATIVE

## 2020-10-07 LAB — BASIC METABOLIC PANEL
BUN: 9 mg/dL (ref 6–23)
CO2: 25 mEq/L (ref 19–32)
Calcium: 9.3 mg/dL (ref 8.4–10.5)
Chloride: 104 mEq/L (ref 96–112)
Creatinine, Ser: 0.65 mg/dL (ref 0.40–1.20)
GFR: 98.07 mL/min (ref >60.00)
Glucose, Bld: 84 mg/dL (ref 70–99)
Potassium: 4 mEq/L (ref 3.5–5.1)
Sodium: 141 mEq/L (ref 135–145)

## 2020-10-07 NOTE — Progress Notes (Signed)
Order placed for add on met b

## 2020-10-10 ENCOUNTER — Encounter: Payer: Self-pay | Admitting: Internal Medicine

## 2020-10-10 DIAGNOSIS — R059 Cough, unspecified: Secondary | ICD-10-CM | POA: Insufficient documentation

## 2020-10-10 NOTE — Assessment & Plan Note (Signed)
Cough at night as outlined.  Treat with pepcid.  Follow.  Get her back in soon to reassess.  Notify me if persistent or change in symptoms.

## 2020-10-10 NOTE — Assessment & Plan Note (Addendum)
Physical today 10/04/20.  PAP 10/04/20.  Mammogram 08/10/20 - Birads I.  Colonoscopy - last recorded colonoscopy 2010.  Had discussed f/u colonoscopy last visit - see note.

## 2020-10-10 NOTE — Assessment & Plan Note (Signed)
On crestor.  Low cholesterol diet and exercise.  Follow lipid panel and liver function tests.   

## 2020-10-15 ENCOUNTER — Encounter: Payer: Self-pay | Admitting: Internal Medicine

## 2020-11-15 ENCOUNTER — Encounter: Payer: Self-pay | Admitting: Internal Medicine

## 2020-12-28 ENCOUNTER — Ambulatory Visit (INDEPENDENT_AMBULATORY_CARE_PROVIDER_SITE_OTHER): Payer: 59 | Admitting: Internal Medicine

## 2020-12-28 ENCOUNTER — Other Ambulatory Visit: Payer: Self-pay

## 2020-12-28 ENCOUNTER — Encounter: Payer: Self-pay | Admitting: Internal Medicine

## 2020-12-28 DIAGNOSIS — R059 Cough, unspecified: Secondary | ICD-10-CM | POA: Diagnosis not present

## 2020-12-28 DIAGNOSIS — E7800 Pure hypercholesterolemia, unspecified: Secondary | ICD-10-CM

## 2020-12-28 DIAGNOSIS — Z6834 Body mass index (BMI) 34.0-34.9, adult: Secondary | ICD-10-CM

## 2020-12-28 DIAGNOSIS — Z1211 Encounter for screening for malignant neoplasm of colon: Secondary | ICD-10-CM | POA: Diagnosis not present

## 2020-12-28 NOTE — Progress Notes (Signed)
Patient ID: Alison Rodriguez, female   DOB: 1960-12-01, 60 y.o.   MRN: [id number]   Subjective:    Patient ID: Alison Rodriguez, female    DOB: Mar 10, 1961, 60 y.o.   MRN: [id number]  HPI This visit occurred during the SARS-CoV-2 public health emergency.  Safety protocols were in place, including screening questions prior to the visit, additional usage of staff PPE, and extensive cleaning of exam room while observing appropriate contact time as indicated for disinfecting solutions.  Patient here for a scheduled follow up.  Here to follow up regarding her cholesterol and blood pressure.  She is also concerned regarding weight gain.  Has joined [employer].  She will be using an APP on her phone to help with food choices and diet adjustment.  Also plans to get back in her exercise routine.  She brought up - interested in injectable weight loss medication.  Also reports she was sick previously.  Seen at urgent care 11/27/20. Symptoms had started 11/09/20.  COVID test negative 11/27/20.  Treated with tessalon perles.  Took comtrex.  Was having acid reflux. Started pepcid.  All symptoms resolved now.  No acid reflux.  No cough or congestion.  Feels good.  No chest pain or sob reported.  No abdominal pain or bowel change reported.  Some intermittent problems with hemorrhoids. None now.     Past Medical History:  Diagnosis Date  . Blood in stool    H/O  . Hyperlipidemia   . Hypertension   . Thyroid disease    Past Surgical History:  Procedure Laterality Date  . UTERINE FIBROID EMBOLIZATION     Family History  Problem Relation Age of Onset  . Diabetes Mother   . Diabetes Maternal Grandmother   . Arthritis Maternal Grandfather   . Diabetes Maternal Grandfather   . Breast cancer Other        second cousins/aunt   Social History   Socioeconomic History  . Marital status: Married    Spouse name: Not on file  . Number of children: Not on file  . Years of education: Not on file  . Highest  education level: Not on file  Occupational History  . Not on file  Tobacco Use  . Smoking status: Never Smoker  . Smokeless tobacco: Never Used  Substance and Sexual Activity  . Alcohol use: No    Alcohol/week: 0.0 standard drinks  . Drug use: No  . Sexual activity: Not on file  Other Topics Concern  . Not on file  Social History Narrative  . Not on file   Social Determinants of Health   Financial Resource Strain: Not on file  Food Insecurity: Not on file  Transportation Needs: Not on file  Physical Activity: Not on file  Stress: Not on file  Social Connections: Not on file    Outpatient Encounter Medications as of 12/28/2020  Medication Sig  . hydrocortisone (ANUSOL-HC) 25 MG suppository Place 1 suppository (25 mg total) rectally 2 (two) times daily.  . rosuvastatin (CRESTOR) 10 MG tablet TAKE 1 TABLET BY MOUTH EVERY DAY  . [DISCONTINUED] famotidine (PEPCID) 20 MG tablet Take one tablet daily 30 minutes before your evening meal   No facility-administered encounter medications on file as of 12/28/2020.    Review of Systems  Constitutional: Negative for appetite change and unexpected weight change.  HENT: Negative for congestion and sinus pressure.   Respiratory: Negative for cough, chest tightness and shortness of breath.   Cardiovascular: Negative for chest pain,  palpitations and leg swelling.  Gastrointestinal: Negative for abdominal pain, diarrhea, nausea and vomiting.  Genitourinary: Negative for difficulty urinating and dysuria.  Musculoskeletal: Negative for joint swelling and myalgias.  Skin: Negative for color change and rash.  Neurological: Negative for dizziness, light-headedness and headaches.  Psychiatric/Behavioral: Negative for agitation and dysphoric mood.       Objective:    Physical Exam Vitals reviewed.  Constitutional:      General: She is not in acute distress.    Appearance: Normal appearance.  HENT:     Head: Normocephalic and atraumatic.      Right Ear: External ear normal.     Left Ear: External ear normal.     Mouth/Throat:     Mouth: Oropharynx is clear and moist.  Eyes:     General: No scleral icterus.       Right eye: No discharge.        Left eye: No discharge.     Conjunctiva/sclera: Conjunctivae normal.  Neck:     Thyroid: No thyromegaly.  Cardiovascular:     Rate and Rhythm: Normal rate and regular rhythm.  Pulmonary:     Effort: No respiratory distress.     Breath sounds: Normal breath sounds. No wheezing.  Abdominal:     General: Bowel sounds are normal.     Palpations: Abdomen is soft.     Tenderness: There is no abdominal tenderness.  Musculoskeletal:        General: No swelling, tenderness or edema.     Cervical back: Neck supple. No tenderness.  Lymphadenopathy:     Cervical: No cervical adenopathy.  Skin:    Findings: No erythema or rash.  Neurological:     Mental Status: She is alert.  Psychiatric:        Mood and Affect: Mood normal.        Behavior: Behavior normal.     BP 120/70   Pulse 80   Temp 98.3 F (36.8 C) (Oral)   Resp 16   Ht 4\' 11"  (1.499 m)   Wt 170 lb (77.1 kg)   LMP 06/22/2015 (Approximate)   SpO2 99%   BMI 34.34 kg/m  Wt Readings from Last 3 Encounters:  12/28/20 170 lb (77.1 kg)  10/04/20 167 lb (75.8 kg)  03/29/20 163 lb 3.2 oz (74 kg)     Lab Results  Component Value Date   WBC 6.0 10/06/2020   HGB 14.2 10/06/2020   HCT 43.5 10/06/2020   PLT 273.0 10/06/2020   GLUCOSE 84 10/07/2020   CHOL 154 10/06/2020   TRIG 72.0 10/06/2020   HDL 50.20 10/06/2020   LDLCALC 90 10/06/2020   ALT 17 10/06/2020   AST 15 10/06/2020   NA 141 10/07/2020   K 4.0 10/07/2020   CL 104 10/07/2020   CREATININE 0.65 10/07/2020   BUN 9 10/07/2020   CO2 25 10/07/2020   TSH 1.42 10/06/2020       Assessment & Plan:   Problem List Items Addressed This Visit    BMI 34.0-34.9,adult    Discussed diet and exercise.  She desires medication.  Discussed treatment options.   Follow.        Relevant Orders   AMB Referral [facility]   Colon cancer screening    Due colonoscopy.        Cough    Previous cough.  Resolved.  pepcid controlled acid reflux.        Hypercholesterolemia    On crestor.  Low cholesterol diet and exercise. Follow lipid panel and liver function tests.        Relevant Orders   Hepatic function panel   Lipid panel   Basic metabolic panel       Einar Pheasant, MD

## 2021-01-02 ENCOUNTER — Encounter: Payer: Self-pay | Admitting: Internal Medicine

## 2021-01-02 DIAGNOSIS — Z6834 Body mass index (BMI) 34.0-34.9, adult: Secondary | ICD-10-CM | POA: Insufficient documentation

## 2021-01-02 NOTE — Assessment & Plan Note (Signed)
On crestor.  Low cholesterol diet and exercise.  Follow lipid panel and liver function tests.   

## 2021-01-02 NOTE — Assessment & Plan Note (Signed)
Previous cough.  Resolved.  pepcid controlled acid reflux.

## 2021-01-02 NOTE — Assessment & Plan Note (Signed)
Due colonoscopy.  

## 2021-01-02 NOTE — Assessment & Plan Note (Signed)
Discussed diet and exercise.  She desires medication.  Discussed treatment options.  Follow.

## 2021-01-03 ENCOUNTER — Encounter: Payer: Self-pay | Admitting: Internal Medicine

## 2021-01-03 DIAGNOSIS — Z1211 Encounter for screening for malignant neoplasm of colon: Secondary | ICD-10-CM

## 2021-01-03 NOTE — Telephone Encounter (Signed)
Order placed for GI referral for colonoscopy.

## 2021-01-04 ENCOUNTER — Telehealth: Payer: Self-pay

## 2021-01-04 NOTE — Chronic Care Management (AMB) (Signed)
  Care Management   Note  01/04/2021 Name: Rayley Gao MRN: [id number] DOB: 05/05/1961  Miranda Frese is a 60 y.o. year old female who is a primary care patient of Einar Pheasant, MD. I reached out to [employer] by phone today in response to a referral sent by Ms. [place]'s PCP Dr. Nicki Reaper.    Ms. Antwine was given information about care management services today including:  1. Care management services include personalized support from designated clinical staff supervised by her physician, including individualized plan of care and coordination with other care providers 2. 24/7 contact phone numbers for assistance for urgent and routine care needs. 3. The patient may stop care management services at any time by phone call to the office staff.  Patient agreed to services and verbal consent obtained.   Follow up plan: Telephone appointment with care management team member scheduled for:01/24/2021  Noreene Larsson, [place], [place], [place] [address] Direct Dial: [phone] [email or web address]

## 2021-01-05 ENCOUNTER — Ambulatory Visit: Payer: 59 | Admitting: Dermatology

## 2021-01-07 ENCOUNTER — Other Ambulatory Visit: Payer: Self-pay

## 2021-01-07 ENCOUNTER — Telehealth (INDEPENDENT_AMBULATORY_CARE_PROVIDER_SITE_OTHER): Payer: Self-pay | Admitting: Gastroenterology

## 2021-01-07 DIAGNOSIS — Z1211 Encounter for screening for malignant neoplasm of colon: Secondary | ICD-10-CM

## 2021-01-07 MED ORDER — NA SULFATE-K SULFATE-MG SULF 17.5-3.13-1.6 GM/177ML PO SOLN: 1.0000 | Freq: Once | ORAL | 0 refills | Status: AC

## 2021-01-07 NOTE — Progress Notes (Signed)
Gastroenterology Pre-Procedure Review  Request Date: fRIDAY 02/04/21 Requesting Physician: Dr. Allen Norris  PATIENT REVIEW QUESTIONS: The patient responded to the following health history questions as indicated:    1. Are you having any GI issues? pt does have hemorroids 2. Do you have a personal history of Polyps? no 3. Do you have a family history of Colon Cancer or Polyps? no 4. Diabetes Mellitus? no 5. Joint replacements in the past 12 months?no 6. Major health problems in the past 3 months?no 7. Any artificial heart valves, MVP, or defibrillator?no    MEDICATIONS & ALLERGIES:    Patient reports the following regarding taking any anticoagulation/antiplatelet therapy:   Plavix, Coumadin, Eliquis, Xarelto, Lovenox, Pradaxa, Brilinta, or Effient? no Aspirin? no  Patient confirms/reports the following medications:  Current Outpatient Medications  Medication Sig Dispense Refill  . hydrocortisone (ANUSOL-HC) 25 MG suppository Place 1 suppository (25 mg total) rectally 2 (two) times daily. 14 suppository 0  . rosuvastatin (CRESTOR) 10 MG tablet TAKE 1 TABLET BY MOUTH EVERY DAY 30 tablet 11   No current facility-administered medications for this visit.    Patient confirms/reports the following allergies:  No Known Allergies  No orders of the defined types were placed in this encounter.   AUTHORIZATION INFORMATION Primary Insurance: 1D#: Group #:  Secondary Insurance: 1D#: Group #:  SCHEDULE INFORMATION: Date: Friday 02/04/21 Time: Location:[facility]

## 2021-01-10 ENCOUNTER — Telehealth: Payer: 59

## 2021-01-24 ENCOUNTER — Ambulatory Visit: Payer: 59 | Admitting: Pharmacist

## 2021-01-24 DIAGNOSIS — R030 Elevated blood-pressure reading, without diagnosis of hypertension: Secondary | ICD-10-CM

## 2021-01-24 DIAGNOSIS — Z6834 Body mass index (BMI) 34.0-34.9, adult: Secondary | ICD-10-CM

## 2021-01-24 DIAGNOSIS — E7800 Pure hypercholesterolemia, unspecified: Secondary | ICD-10-CM

## 2021-01-24 NOTE — Chronic Care Management (AMB) (Addendum)
Care Management   Pharmacy Note  01/24/2021 Name: Alison Rodriguez MRN: [id number] DOB: 1961-03-24  Subjective: Alison Rodriguez is a 60 y.o. year old female who is a primary care patient of Einar Pheasant, MD. The Care Management team was consulted for assistance with care management and care coordination needs.    Engaged with patient by telephone for initial visit in response to provider referral for pharmacy case management and/or care coordination services.   The patient was given information about Care Management services today including:  1. Care Management services includes personalized support from designated clinical staff supervised by the patient's primary care provider, including individualized plan of care and coordination with other care providers. 2. 24/7 contact phone numbers for assistance for urgent and routine care needs. 3. The patient may stop case management services at any time by phone call to the office staff.  Patient agreed to services and consent obtained.  Assessment:  Review of patient status, including review of consultants reports, laboratory and other test data, was performed as part of comprehensive evaluation and provision of chronic care management services.   SDOH (Social Determinants of Health) assessments and interventions performed:  SDOH Interventions   Flowsheet Row Most Recent Value  SDOH Interventions   Financial Strain Interventions Intervention Not Indicated       Objective:  Lab Results  Component Value Date   CREATININE 0.65 10/07/2020   CREATININE 0.62 03/29/2020   CREATININE 0.65 09/17/2019         Component Value Date/Time   CHOL 154 10/06/2020 0857   TRIG 72.0 10/06/2020 0857   HDL 50.20 10/06/2020 0857   CHOLHDL 3 10/06/2020 0857   VLDL 14.4 10/06/2020 0857   LDLCALC 90 10/06/2020 0857    Clinical ASCVD: No  The 10-year ASCVD risk score Mikey Bussing DC Jr., et al., 2013) is: 2.7%   Values used to calculate the score:      Age: 39 years     Sex: Female     Is Non-Hispanic African American: Yes     Diabetic: No     Tobacco smoker: No     Systolic Blood Pressure: 956 mmHg     Is BP treated: No     HDL Cholesterol: 50.2 mg/dL     Total Cholesterol: 154 mg/dL     BP Readings from Last 3 Encounters:  12/28/20 120/70  10/04/20 128/76  03/29/20 118/70    Care Plan  No Known Allergies  Medications Reviewed Today    Reviewed by Avie Arenas, RPH (Pharmacist) on 01/24/21 at 1548  Med List Status: <None>  Medication Order Taking? Sig Documenting Provider Last Dose Status Informant  hydrocortisone (ANUSOL-HC) 25 MG suppository [id number] No Place 1 suppository (25 mg total) rectally 2 (two) times daily.  Patient not taking: Reported on 01/24/2021   Einar Pheasant, MD Not Taking Active Self  rosuvastatin (CRESTOR) 10 MG tablet [id number] Yes TAKE 1 TABLET BY MOUTH EVERY DAY Einar Pheasant, MD Taking Active Self          Patient Active Problem List   Diagnosis Date Noted  . BMI 34.0-34.9,adult 01/02/2021  . Cough 10/10/2020  . Pain of right heel 03/30/2020  . Right arm pain 03/30/2020  . Colon cancer screening 03/30/2020  . Postmenopausal bleeding 09/17/2019  . Rash 09/17/2019  . Vaginitis 05/12/2019  . Herpes zoster 05/10/2018  . Urinary frequency 05/10/2018  . Trichomonas infection 05/10/2018  . Abnormal chest CT 02/07/2016  . Health care maintenance 01/25/2015  . Dysuria  07/19/2014  . Hemorrhoids 11/30/2013  . Abnormal Pap smear of cervix 11/30/2013  . Chest tightness 11/30/2013  . Headache 11/26/2013  . Hypercholesterolemia 11/26/2013  . Blood pressure elevated without history of HTN 11/26/2013    Conditions to be addressed/monitored: HLD and Obesity  Care Plan : Disease Progression Prevention  Updates made by Lashan Macias A, RPH since 01/24/2021 12:00 AM    Problem: Obesity, HLD, GERD     Long-Range Goal: Patient Stated   This Visit's Progress: On track  Priority: High  Note:    Current Barriers:  . Unable to achieve control of obesity   Pharmacist Clinical Goal(s):  Marland Kitchen Over the next 90 days, patient will achieve control of obesity as evidenced by weight through collaboration with PharmD and provider.    Interventions: . 1:1 collaboration with Einar Pheasant, MD regarding development and update of comprehensive plan of care as evidenced by provider attestation and co-signature . Inter-disciplinary care team collaboration (see longitudinal plan of care) . Comprehensive medication review performed; medication list updated in electronic medical record  Obesity: . Unable to independently achieve weight loss with diet and exercise alone. Notes she has previously tried diet programs including weight watchers with no sustained success. Reports using perfect body app which helps select healthy food choices  . Prior medications for weight loss tried: none  . Baseline weight: 170 lbs . Goal weight in the next 6 months: 130 lbs . Current dietary patterns: "intermittent fasting;" breakfast: usually skips; on the weekends egg/sausage/egg McMuffin; lunch/dinner: leftovers at restaurants - 3-4x times per weekend; salmon and broccoli; hibachi, chick fil la; snacks: desserts (banana cheesecake, key lime, cupcakes); drinks: [place] dry, water, caffeinated coffee . Current exercise patterns: started yesterday - [employer] walking the weight off 30 day plan - 30 minutes daily  Discussed GLP1 for weight loss. Patient amenable to start [facility]. Start Wegovy 0.25 mg weekly x 4 weeks and increase at monthly intervals as tolerated, however PA denied and will send appeal.   Encouraged continued 150 minutes of moderate intensity exercise weekly. Encourage to increase intensity if regimen becomes easy.   Encouraged to focus on reducing portion sizes, cutting back on high sugars, and increasing fiber intake.   Educated on goal 5-10% baseline body weight loss over 3 months.    Hyperlipidemia: . Controlled; current treatment: rosuvastatin 10 mg daily  . Recommended to continue current regimen at this time  GERD: . Controlled; current treatment: none   . Recommended avoiding spicy foods that may exacerbate symptoms   Patient Goals/Self-Care Activities . Over the next 90 days, patient will:  - target a minimum of 150 minutes of moderate intensity exercise weekly engage in dietary modifications by reducing portion sizes, cutting back on high sugars, and increasing fiber intake.   Follow Up Plan: The care management team will reach out to the patient again over the next 30 days regarding Wegovy PA appeal status      Medication Assistance:  None required.  Patient affirms current coverage meets needs.  Follow Up:  Patient agrees to Care Plan and Follow-up.  Plan: The care management team will reach out to the patient again over the next 30 days regarding Wegovy PA appeal status  Lorel Monaco, PharmD, [place]   I was present for this visit and agree with the documentation by the resident as above.   Catie Darnelle Maffucci, PharmD, [place], [place] Clinical Pharmacist [employer] at [place]

## 2021-01-24 NOTE — Patient Instructions (Signed)
  Visit Information  PATIENT GOALS:  Goals Addressed              This Visit's Progress   .  Medication Monitoring (pt-stated)        Patient Goals/Self-Care Activities . Over the next 90 days, patient will:  - target a minimum of 150 minutes of moderate intensity exercise weekly engage in dietary modifications by reducing portion sizes, cutting back on high sugars, and increasing fiber intake.        Ms. Cassetta was given information about Care Management services today including:  1. Care Management services include personalized support from designated clinical staff supervised by her physician, including individualized plan of care and coordination with other care providers 2. 24/7 contact phone numbers for assistance for urgent and routine care needs. 3. The patient may stop CCM services at any time (effective at the end of the month) by phone call to the office staff.  Patient agreed to services and verbal consent obtained.   Patient verbalizes understanding of instructions provided today and agrees to view in [place].   Follow Up Plan: The care management team will reach out to the patient again over the next 30 days regarding Wegovy PA appeal status  Lorel Monaco, PharmD, BCPS PGY2 Ambulatory Care Resident [place]

## 2021-01-26 ENCOUNTER — Encounter: Payer: Self-pay | Admitting: Gastroenterology

## 2021-01-31 ENCOUNTER — Ambulatory Visit: Payer: 59 | Admitting: Pharmacist

## 2021-01-31 DIAGNOSIS — R7301 Impaired fasting glucose: Secondary | ICD-10-CM

## 2021-01-31 DIAGNOSIS — Z6834 Body mass index (BMI) 34.0-34.9, adult: Secondary | ICD-10-CM

## 2021-01-31 DIAGNOSIS — E7800 Pure hypercholesterolemia, unspecified: Secondary | ICD-10-CM

## 2021-01-31 NOTE — Chronic Care Management (AMB) (Signed)
Care Management   Pharmacy Note  01/31/2021 Name: Alison Rodriguez MRN: [id number] DOB: 1961-01-09  Subjective: Alison Rodriguez is a 60 y.o. year old female who is a primary care patient of Einar Pheasant, MD. The Care Management team was consulted for assistance with care management and care coordination needs.    Engaged with patient by telephone for medication access follow u[ in response to provider referral for pharmacy case management and/or care coordination services.   The patient was given information about Care Management services today including:  1. Care Management services includes personalized support from designated clinical staff supervised by the patient's primary care provider, including individualized plan of care and coordination with other care providers. 2. 24/7 contact phone numbers for assistance for urgent and routine care needs. 3. The patient may stop case management services at any time by phone call to the office staff.  Patient agreed to services and consent obtained.  Assessment:  Review of patient status, including review of consultants reports, laboratory and other test data, was performed as part of comprehensive evaluation and provision of chronic care management services.   SDOH (Social Determinants of Health) assessments and interventions performed:  SDOH Interventions   Flowsheet Row Most Recent Value  SDOH Interventions   Financial Strain Interventions Intervention Not Indicated       Objective:  Lab Results  Component Value Date   CREATININE 0.65 10/07/2020   CREATININE 0.62 03/29/2020   CREATININE 0.65 09/17/2019    No results found for: HGBA1C     Component Value Date/Time   CHOL 154 10/06/2020 0857   TRIG 72.0 10/06/2020 0857   HDL 50.20 10/06/2020 0857   CHOLHDL 3 10/06/2020 0857   VLDL 14.4 10/06/2020 0857   LDLCALC 90 10/06/2020 0857     Clinical ASCVD: No  The 10-year ASCVD risk score Mikey Bussing DC Jr., et al., 2013) is:  2.7%   Values used to calculate the score:     Age: 62 years     Sex: Female     Is Non-Hispanic African American: Yes     Diabetic: No     Tobacco smoker: No     Systolic Blood Pressure: 614 mmHg     Is BP treated: No     HDL Cholesterol: 50.2 mg/dL     Total Cholesterol: 154 mg/dL     BP Readings from Last 3 Encounters:  12/28/20 120/70  10/04/20 128/76  03/29/20 118/70    Care Plan  No Known Allergies  Medications Reviewed Today    Reviewed by Karma Greaser, RN (Registered Nurse) on 01/26/21 at [place] List Status: <None>  Medication Order Taking? Sig Documenting Provider Last Dose Status Informant  hydrocortisone (ANUSOL-HC) 25 MG suppository [id number]  Place 1 suppository (25 mg total) rectally 2 (two) times daily.  Patient not taking: Reported on 01/24/2021   Einar Pheasant, MD  Active Self  Multiple Vitamins-Minerals (MULTIVITAMIN ADULT, MINERALS,) TABS [id number] Yes Take 1 tablet by mouth daily. [provider]  Active Self  rosuvastatin (CRESTOR) 10 MG tablet [id number] Yes TAKE 1 TABLET BY MOUTH EVERY DAY Einar Pheasant, MD  Active Self          Patient Active Problem List   Diagnosis Date Noted  . BMI 34.0-34.9,adult 01/02/2021  . Cough 10/10/2020  . Pain of right heel 03/30/2020  . Right arm pain 03/30/2020  . Colon cancer screening 03/30/2020  . Postmenopausal bleeding 09/17/2019  . Rash 09/17/2019  . Vaginitis 05/12/2019  .  Herpes zoster 05/10/2018  . Urinary frequency 05/10/2018  . Trichomonas infection 05/10/2018  . Abnormal chest CT 02/07/2016  . Health care maintenance 01/25/2015  . Dysuria 07/19/2014  . Hemorrhoids 11/30/2013  . Abnormal Pap smear of cervix 11/30/2013  . Chest tightness 11/30/2013  . Headache 11/26/2013  . Hypercholesterolemia 11/26/2013  . Blood pressure elevated without history of HTN 11/26/2013    Conditions to be addressed/monitored: HLD and obesity  Care Plan : Disease Progression Prevention   Updates made by De Hollingshead, RPH-CPP since 01/31/2021 12:00 AM    Problem: Obesity, HLD, GERD     Long-Range Goal: Disease Progression Prevention   This Visit's Progress: On track  Recent Progress: On track  Priority: High  Note:   Current Barriers:  . Unable to achieve control of obesity   Pharmacist Clinical Goal(s):  Marland Kitchen Over the next 90 days, patient will achieve control of obesity as evidenced by weight through collaboration with PharmD and provider.   Interventions: . 1:1 collaboration with Einar Pheasant, MD regarding development and update of comprehensive plan of care as evidenced by provider attestation and co-signature . Inter-disciplinary care team collaboration (see longitudinal plan of care) . Comprehensive medication review performed; medication list updated in electronic medical record  Obesity with history of impaired fasting glucose: Marland Kitchen Unable to independently achieve weight loss with diet and exercise alone. Notes she has previously tried diet programs including weight watchers with no sustained success. Reports using perfect body app which helps select healthy food choices  . Prior medications for weight loss tried: none  . Baseline weight: 170 lbs . Goal weight in the next 6 months: 130 lbs . Current dietary patterns: "intermittent fasting;" breakfast: usually skips; on the weekends egg/sausage/egg McMuffin; lunch/dinner: leftovers at restaurants - 3-4x times per weekend; salmon and broccoli; hibachi, chick fil la; snacks: desserts (banana cheesecake, key lime, cupcakes); drinks: [place] dry, water, caffeinated coffee . Current exercise patterns: walking 30 minutes daily  Faxed in appeal, on 01/25/21. Contacted [employer] today to follow up - appeal was denied on 01/25/21 as the service is not covered under her plan.   Discussed off-label use of Ozempic. Discussed insurance implications. Patient requests to try Ozempic and see if covered. PA completed  today.  Hyperlipidemia: . Controlled; current treatment: rosuvastatin 10 mg daily  . Previously recommended to continue current regimen at this time  GERD: . Controlled; current treatment: none   . Previously recommended avoiding spicy foods that may exacerbate symptoms   Patient Goals/Self-Care Activities . Over the next 90 days, patient will:  - target a minimum of 150 minutes of moderate intensity exercise weekly engage in dietary modifications by reducing portion sizes, cutting back on high sugars, and increasing fiber intake.   Follow Up Plan: The care management team will reach out to the patient again over the next 30 days regarding Ozempic PA status      Medication Assistance:  None required.  Patient affirms current coverage meets needs.  Follow Up:  Patient agrees to Care Plan and Follow-up.  Plan: Telephone follow up appointment with care management team member scheduled for:  pending medication management plan  Catie Darnelle Maffucci, PharmD, [place], [place] Clinical Pharmacist [employer] at [facility] [phone]

## 2021-01-31 NOTE — Patient Instructions (Signed)
Visit Information  Goals Addressed              This Visit's Progress     Patient Stated   .  Medication Monitoring (pt-stated)        Patient Goals/Self-Care Activities . Over the next 90 days, patient will:  - target a minimum of 150 minutes of moderate intensity exercise weekly engage in dietary modifications by reducing portion sizes, cutting back on high sugars, and increasing fiber intake.         The patient verbalized understanding of instructions, educational materials, and care plan provided today and declined offer to receive copy of patient instructions, educational materials, and care plan.   Plan: Telephone follow up appointment with care management team member scheduled for:  pending medication management plan  Catie Darnelle Maffucci, PharmD, [place], [place] Clinical Pharmacist [employer] at [facility] [phone]

## 2021-02-02 ENCOUNTER — Other Ambulatory Visit
Admission: RE | Admit: 2021-02-02 | Discharge: 2021-02-02 | Disposition: A | Discharge status: Home / Self Care | Payer: 59 | Source: Ambulatory Visit | Attending: Gastroenterology | Admitting: Gastroenterology

## 2021-02-02 ENCOUNTER — Other Ambulatory Visit: Payer: Self-pay

## 2021-02-02 DIAGNOSIS — Z20822 Contact with and (suspected) exposure to covid-19: Secondary | ICD-10-CM | POA: Diagnosis not present

## 2021-02-02 DIAGNOSIS — Z01812 Encounter for preprocedural laboratory examination: Secondary | ICD-10-CM | POA: Diagnosis not present

## 2021-02-02 LAB — SARS CORONAVIRUS 2 (TAT 6-24 HRS): SARS Coronavirus 2: NEGATIVE

## 2021-02-03 NOTE — Discharge Instructions (Signed)

## 2021-02-04 ENCOUNTER — Ambulatory Visit
Admission: RE | Admit: 2021-02-04 | Discharge: 2021-02-04 | Disposition: A | Discharge status: Home / Self Care | Payer: 59 | Attending: Gastroenterology | Admitting: Gastroenterology

## 2021-02-04 ENCOUNTER — Ambulatory Visit: Payer: 59 | Admitting: Anesthesiology

## 2021-02-04 ENCOUNTER — Encounter: Payer: Self-pay | Admitting: Gastroenterology

## 2021-02-04 ENCOUNTER — Encounter
Admission: RE | Disposition: A | Discharge status: Home / Self Care | Payer: Self-pay | Source: Home / Self Care | Attending: Gastroenterology

## 2021-02-04 ENCOUNTER — Other Ambulatory Visit: Payer: Self-pay

## 2021-02-04 DIAGNOSIS — K635 Polyp of colon: Secondary | ICD-10-CM

## 2021-02-04 DIAGNOSIS — D124 Benign neoplasm of descending colon: Secondary | ICD-10-CM | POA: Insufficient documentation

## 2021-02-04 DIAGNOSIS — I10 Essential (primary) hypertension: Secondary | ICD-10-CM | POA: Insufficient documentation

## 2021-02-04 DIAGNOSIS — Z79899 Other long term (current) drug therapy: Secondary | ICD-10-CM | POA: Insufficient documentation

## 2021-02-04 DIAGNOSIS — K648 Other hemorrhoids: Secondary | ICD-10-CM | POA: Insufficient documentation

## 2021-02-04 DIAGNOSIS — Z7722 Contact with and (suspected) exposure to environmental tobacco smoke (acute) (chronic): Secondary | ICD-10-CM | POA: Insufficient documentation

## 2021-02-04 DIAGNOSIS — E785 Hyperlipidemia, unspecified: Secondary | ICD-10-CM | POA: Insufficient documentation

## 2021-02-04 DIAGNOSIS — Z1211 Encounter for screening for malignant neoplasm of colon: Secondary | ICD-10-CM | POA: Insufficient documentation

## 2021-02-04 HISTORY — PX: POLYPECTOMY: SHX5525

## 2021-02-04 HISTORY — PX: COLONOSCOPY WITH PROPOFOL: SHX5780

## 2021-02-04 SURGERY — COLONOSCOPY WITH PROPOFOL
Anesthesia: General | Site: Rectum

## 2021-02-04 MED ORDER — PROPOFOL 10 MG/ML IV BOLUS
INTRAVENOUS | Status: DC | PRN
  Administered 2021-02-04 (×2): 30 mg via INTRAVENOUS
  Administered 2021-02-04: 50 mg via INTRAVENOUS
  Administered 2021-02-04: 30 mg via INTRAVENOUS

## 2021-02-04 MED ORDER — LIDOCAINE HCL (CARDIAC) PF 100 MG/5ML IV SOSY
PREFILLED_SYRINGE | INTRAVENOUS | Status: DC | PRN
  Administered 2021-02-04: 100 mg via INTRAVENOUS

## 2021-02-04 MED ORDER — LACTATED RINGERS IV SOLN: INTRAVENOUS | Status: DC

## 2021-02-04 MED ORDER — STERILE WATER FOR IRRIGATION IR SOLN
Status: DC | PRN
  Administered 2021-02-04: 100 mL

## 2021-02-04 SURGICAL SUPPLY — 22 items
CLIP HMST 235XBRD CATH ROT (MISCELLANEOUS) IMPLANT
CLIP RESOLUTION 360 11X235 (MISCELLANEOUS)
ELECT REM PT RETURN 9FT ADLT (ELECTROSURGICAL)
ELECTRODE REM PT RTRN 9FT ADLT (ELECTROSURGICAL) IMPLANT
FORCEPS BIOP RAD 4 LRG CAP 4 (CUTTING FORCEPS) IMPLANT
GOWN CVR UNV OPN BCK APRN NK (MISCELLANEOUS) ×4 IMPLANT
GOWN ISOL THUMB LOOP REG UNIV (MISCELLANEOUS) ×6
INJECTOR VARIJECT VIN23 (MISCELLANEOUS) IMPLANT
KIT DEFENDO VALVE AND CONN (KITS) IMPLANT
KIT PRC NS LF DISP ENDO (KITS) ×2 IMPLANT
KIT PROCEDURE OLYMPUS (KITS) ×3
MANIFOLD NEPTUNE II (INSTRUMENTS) ×3 IMPLANT
MARKER SPOT ENDO TATTOO 5ML (MISCELLANEOUS) IMPLANT
PROBE APC STR FIRE (PROBE) IMPLANT
RETRIEVER NET ROTH 2.5X230 LF (MISCELLANEOUS) IMPLANT
SNARE COLD EXACTO (MISCELLANEOUS) ×1 IMPLANT
SNARE SHORT THROW 13M SML OVAL (MISCELLANEOUS) IMPLANT
SNARE SNG USE RND 15MM (INSTRUMENTS) IMPLANT
SPOT EX ENDOSCOPIC TATTOO (MISCELLANEOUS)
TRAP ETRAP POLY (MISCELLANEOUS) ×1 IMPLANT
VARIJECT INJECTOR VIN23 (MISCELLANEOUS)
WATER STERILE IRR 250ML POUR (IV SOLUTION) ×3 IMPLANT

## 2021-02-04 NOTE — Anesthesia Procedure Notes (Signed)
Procedure Name: General with mask airway Date/Time: 02/04/2021 8:33 AM Performed by: Dionne Bucy, CRNA Pre-anesthesia Checklist: Patient identified, Emergency Drugs available, Suction available, Patient being monitored and Timeout performed Patient Re-evaluated:Patient Re-evaluated prior to induction Oxygen Delivery Method: Nasal cannula Placement Confirmation: positive ETCO2

## 2021-02-04 NOTE — Op Note (Addendum)
[facility] Gastroenterology Patient Name: Alison Rodriguez Procedure Date: 02/04/2021 8:29 AM MRN: [id number] Account #: [id number] Date of Birth: 05-18-1961 Admit Type: Outpatient Age: 60 Room: [facility] OR ROOM 01 Gender: Female Note Status: Finalized Procedure:             Colonoscopy Indications:           Screening for colorectal malignant neoplasm Providers:             Lucilla Lame MD, MD Referring MD:          Einar Pheasant, MD (Referring MD) Medicines:             Propofol per Anesthesia Complications:         No immediate complications. Procedure:             Pre-Anesthesia Assessment:                        - Prior to the procedure, a History and Physical was                         performed, and patient medications and allergies were                         reviewed. The patient's tolerance of previous                         anesthesia was also reviewed. The risks and benefits                         of the procedure and the sedation options and risks                         were discussed with the patient. All questions were                         answered, and informed consent was obtained. Prior                         Anticoagulants: The patient has taken no previous                         anticoagulant or antiplatelet agents. ASA Grade                         Assessment: II - A patient with mild systemic disease.                         After reviewing the risks and benefits, the patient                         was deemed in satisfactory condition to undergo the                         procedure.                        After obtaining informed consent, the colonoscope was  passed under direct vision. Throughout the procedure,                         the patient's blood pressure, pulse, and oxygen                         saturations were monitored continuously. The was                         introduced through the anus and  advanced to the the                         cecum, identified by appendiceal orifice and ileocecal                         valve. The colonoscopy was performed without                         difficulty. The patient tolerated the procedure well.                         The quality of the bowel preparation was good. Findings:      The perianal and digital rectal examinations were normal.      A 4 mm polyp was found in the descending colon. The polyp was       pedunculated. The polyp was removed with a cold snare. Resection and       retrieval were complete.      Non-bleeding internal hemorrhoids were found during retroflexion. The       hemorrhoids were Grade I (internal hemorrhoids that do not prolapse). Impression:            - One 4 mm polyp in the descending colon, removed with                         a cold snare. Resected and retrieved.                        - Non-bleeding internal hemorrhoids. Recommendation:        - Discharge patient to home.                        - Resume previous diet.                        - Continue present medications.                        - Await pathology results.                        - Repeat colonoscopy in 7 years if polyp adenoma and                         10 years if hyperplastic Procedure Code(s):     --- Professional ---                        [phone], Colonoscopy, flexible; with removal of  tumor(s), polyp(s), or other lesion(s) by snare                         technique Diagnosis Code(s):     --- Professional ---                        Z12.11, Encounter for screening for malignant neoplasm                         of colon                        K63.5, Polyp of colon CPT copyright 2019 American Medical Association. All rights reserved. The codes documented in this report are preliminary and upon coder review may  be revised to meet current compliance requirements. Lucilla Lame MD, MD 02/04/2021 8:52:27 AM This report  has been signed electronically. Number of Addenda: 0 Note Initiated On: 02/04/2021 8:29 AM Scope Withdrawal Time: 0 hours 8 minutes 32 seconds  Total Procedure Duration: 0 hours 10 minutes 40 seconds  Estimated Blood Loss:  Estimated blood loss: none.      [facility]

## 2021-02-04 NOTE — Transfer of Care (Signed)
Immediate Anesthesia Transfer of Care Note  Patient: Alison Rodriguez  Procedure(s) Performed: COLONOSCOPY WITH PROPOFOL (N/A Rectum) POLYPECTOMY (Rectum)  Patient Location: PACU  Anesthesia Type: General  Level of Consciousness: awake, alert  and patient cooperative  Airway and Oxygen Therapy: Patient Spontanous Breathing and Patient connected to supplemental oxygen  Post-op Assessment: Post-op Vital signs reviewed, Patient's Cardiovascular Status Stable, Respiratory Function Stable, Patent Airway and No signs of Nausea or vomiting  Post-op Vital Signs: Reviewed and stable  Complications: No complications documented.

## 2021-02-04 NOTE — Progress Notes (Signed)
No risk at this time. 

## 2021-02-04 NOTE — Anesthesia Preprocedure Evaluation (Signed)
Anesthesia Evaluation  Patient identified by MRN, date of birth, ID band Patient awake    Reviewed: Allergy & Precautions, H&P , NPO status , Patient's Chart, lab work & pertinent test results, reviewed documented beta blocker date and time   Airway Mallampati: II  TM Distance: >3 FB Neck ROM: full    Dental no notable dental hx.    Pulmonary neg pulmonary ROS,    Pulmonary exam normal breath sounds clear to auscultation       Cardiovascular Exercise Tolerance: Good hypertension,  Rhythm:regular Rate:Normal     Neuro/Psych  Headaches, negative psych ROS   GI/Hepatic negative GI ROS, Neg liver ROS,   Endo/Other  negative endocrine ROS  Renal/GU negative Renal ROS  negative genitourinary   Musculoskeletal   Abdominal   Peds  Hematology negative hematology ROS (+)   Anesthesia Other Findings   Reproductive/Obstetrics negative OB ROS                             Anesthesia Physical Anesthesia Plan  ASA: II  Anesthesia Plan: General   Post-op Pain Management:    Induction:   PONV Risk Score and Plan: 3 and Propofol infusion, Treatment may vary due to age or medical condition and TIVA  Airway Management Planned:   Additional Equipment:   Intra-op Plan:   Post-operative Plan:   Informed Consent: I have reviewed the patients History and Physical, chart, labs and discussed the procedure including the risks, benefits and alternatives for the proposed anesthesia with the patient or authorized representative who has indicated his/her understanding and acceptance.     Dental Advisory Given  Plan Discussed with: CRNA  Anesthesia Plan Comments:         Anesthesia Quick Evaluation

## 2021-02-04 NOTE — Anesthesia Postprocedure Evaluation (Signed)
Anesthesia Post Note  Patient: Alison Rodriguez  Procedure(s) Performed: COLONOSCOPY WITH PROPOFOL (N/A Rectum) POLYPECTOMY (Rectum)     Patient location during evaluation: PACU Anesthesia Type: General Level of consciousness: awake and alert Pain management: pain level controlled Vital Signs Assessment: post-procedure vital signs reviewed and stable Respiratory status: spontaneous breathing, nonlabored ventilation, respiratory function stable and patient connected to nasal cannula oxygen Cardiovascular status: blood pressure returned to baseline and stable Postop Assessment: no apparent nausea or vomiting Anesthetic complications: no   No complications documented.  Alisa Graff

## 2021-02-04 NOTE — H&P (Signed)
   Alison Lame, MD [address] [place], [place] [address] Phone: [phone] Fax : [phone]  Primary Care Physician:  Einar Pheasant, MD Primary Gastroenterologist:  Dr. Allen Norris  Pre-Procedure History & Physical: HPI:  Alison Rodriguez is a 60 y.o. female is here for a screening colonoscopy.   Past Medical History:  Diagnosis Date  . Blood in stool    H/O  . Hyperlipidemia   . Hypertension   . Thyroid disease     Past Surgical History:  Procedure Laterality Date  . UTERINE FIBROID EMBOLIZATION      Prior to Admission medications   Medication Sig Start Date End Date Taking? Authorizing Provider  Multiple Vitamins-Minerals (MULTIVITAMIN ADULT, MINERALS,) TABS Take 1 tablet by mouth daily.   Yes [provider]  rosuvastatin (CRESTOR) 10 MG tablet TAKE 1 TABLET BY MOUTH EVERY DAY 03/16/20  Yes Einar Pheasant, MD  hydrocortisone (ANUSOL-HC) 25 MG suppository Place 1 suppository (25 mg total) rectally 2 (two) times daily. Patient not taking: Reported on 01/24/2021 09/12/18   Einar Pheasant, MD    Allergies as of 01/07/2021  . (No Known Allergies)    Family History  Problem Relation Age of Onset  . Diabetes Mother   . Diabetes Maternal Grandmother   . Arthritis Maternal Grandfather   . Diabetes Maternal Grandfather   . Breast cancer Other        second cousins/aunt    Social History   Socioeconomic History  . Marital status: Married    Spouse name: Not on file  . Number of children: Not on file  . Years of education: Not on file  . Highest education level: Not on file  Occupational History  . Not on file  Tobacco Use  . Smoking status: Passive Smoke Exposure - Never Smoker  . Smokeless tobacco: Never Used  Substance and Sexual Activity  . Alcohol use: No    Alcohol/week: 0.0 standard drinks  . Drug use: No  . Sexual activity: Not on file  Other Topics Concern  . Not on file  Social History Narrative  . Not on file   Social  Determinants of Health   Financial Resource Strain: Low Risk   . Difficulty of Paying Living Expenses: Not hard at all  Food Insecurity: Not on file  Transportation Needs: Not on file  Physical Activity: Not on file  Stress: Not on file  Social Connections: Not on file  Intimate Partner Violence: Not on file    Review of Systems: See HPI, otherwise negative ROS  Physical Exam: BP (!) 138/92   Pulse 69   Temp (!) 97.3 F (36.3 C) (Temporal)   Resp 18   Ht 4\' 11"  (1.499 m)   Wt 75.8 kg   LMP 06/22/2015 (Approximate)   SpO2 100%   BMI 33.73 kg/m  General:   Alert,  pleasant and cooperative in NAD Head:  Normocephalic and atraumatic. Neck:  Supple; no masses or thyromegaly. Lungs:  Clear throughout to auscultation.    Heart:  Regular rate and rhythm. Abdomen:  Soft, nontender and nondistended. Normal bowel sounds, without guarding, and without rebound.   Neurologic:  Alert and  oriented x4;  grossly normal neurologically.  Impression/Plan: Alison Rodriguez is now here to undergo a screening colonoscopy.  Risks, benefits, and alternatives regarding colonoscopy have been reviewed with the patient.  Questions have been answered.  All parties agreeable.

## 2021-02-07 ENCOUNTER — Encounter: Payer: Self-pay | Admitting: Gastroenterology

## 2021-02-08 LAB — SURGICAL PATHOLOGY

## 2021-02-09 ENCOUNTER — Ambulatory Visit (INDEPENDENT_AMBULATORY_CARE_PROVIDER_SITE_OTHER): Payer: 59 | Admitting: Dermatology

## 2021-02-09 ENCOUNTER — Other Ambulatory Visit: Payer: Self-pay

## 2021-02-09 ENCOUNTER — Encounter: Payer: Self-pay | Admitting: Gastroenterology

## 2021-02-09 DIAGNOSIS — L810 Postinflammatory hyperpigmentation: Secondary | ICD-10-CM

## 2021-02-09 DIAGNOSIS — L700 Acne vulgaris: Secondary | ICD-10-CM | POA: Diagnosis not present

## 2021-02-09 MED ORDER — FINACEA 15 % EX FOAM: CUTANEOUS | 2 refills | Status: DC

## 2021-02-09 MED ORDER — TRETINOIN 0.05 % EX CREA: TOPICAL_CREAM | CUTANEOUS | 2 refills | Status: DC

## 2021-02-09 NOTE — Progress Notes (Signed)
   Follow-Up Visit   Subjective  Alison Rodriguez is a 60 y.o. female who presents for the following: Dark spots (Patient here for dark spots on the face. She has tried to fade areas using Serum C and Cocoa Shea. They have helped some, but not clear.). No history of eczema. Hx of acne in the past.   The following portions of the chart were reviewed this encounter and updated as appropriate:       Review of Systems:  No other skin or systemic complaints except as noted in HPI or Assessment and Plan.  Objective  Well appearing patient in no apparent distress; mood and affect are within normal limits.  A focused examination was performed including face. Relevant physical exam findings are noted in the Assessment and Plan.  Objective  face: Resolving inflammatory papules on right cheek, left cheek, left temple with hyperpigmented macules and patches  Images         Assessment & Plan  Acne vulgaris face  With PIH  Start tretinoin 0.05% cream Apply a pea-sized amount to face qhs as tolerated  Start Finacea Foam Apply to face qd/bid dsp 50g 2Rf.  Recommend daily broad spectrum sunscreen SPF 30+ to sun-exposed areas, reapply every 2 hours as needed.  She can continue the Vit C serum Qam  Photos taken today.   Discussed adding fade cream on follow-up if dark spots not improving.   tretinoin (RETIN-A) 0.05 % cream - face  Azelaic Acid (FINACEA) 15 % FOAM - face  Return in about 3 months (around 05/12/2021) for acne with PIH.   Documentation: I have reviewed the above documentation for accuracy and completeness, and I agree with the above.  Brendolyn Patty MD

## 2021-02-09 NOTE — Patient Instructions (Signed)
Topical retinoid medications like tretinoin/Retin-A, adapalene/Differin, tazarotene/Fabior, and Epiduo/Epiduo Forte can cause dryness and irritation when first started. Only apply a pea-sized amount to the entire affected area. Avoid applying it around the eyes, edges of mouth and creases at the nose. If you experience irritation, use a good moisturizer first and/or apply the medicine less often. If you are doing well with the medicine, you can increase how often you use it until you are applying every night. Be careful with sun protection while using this medication as it can make you sensitive to the sun. This medicine should not be used by pregnant women.   Recommend daily broad spectrum sunscreen SPF 30+ to sun-exposed areas, reapply every 2 hours as needed.

## 2021-03-03 ENCOUNTER — Other Ambulatory Visit: Payer: Self-pay | Admitting: Internal Medicine

## 2021-04-05 ENCOUNTER — Ambulatory Visit (INDEPENDENT_AMBULATORY_CARE_PROVIDER_SITE_OTHER): Payer: 59 | Admitting: Internal Medicine

## 2021-04-05 ENCOUNTER — Other Ambulatory Visit: Payer: Self-pay

## 2021-04-05 DIAGNOSIS — Z6834 Body mass index (BMI) 34.0-34.9, adult: Secondary | ICD-10-CM

## 2021-04-05 DIAGNOSIS — E7800 Pure hypercholesterolemia, unspecified: Secondary | ICD-10-CM | POA: Diagnosis not present

## 2021-04-05 LAB — BASIC METABOLIC PANEL
BUN: 13 mg/dL (ref 6–23)
CO2: 27 mEq/L (ref 19–32)
Calcium: 9.2 mg/dL (ref 8.4–10.5)
Chloride: 104 mEq/L (ref 96–112)
Creatinine, Ser: 0.56 mg/dL (ref 0.40–1.20)
GFR: 101.3 mL/min (ref >60.00)
Glucose, Bld: 74 mg/dL (ref 70–99)
Potassium: 4.2 mEq/L (ref 3.5–5.1)
Sodium: 138 mEq/L (ref 135–145)

## 2021-04-05 LAB — LIPID PANEL
Cholesterol: 171 mg/dL (ref 0–200)
HDL: 46.2 mg/dL (ref >39.00)
LDL Cholesterol: 109 mg/dL — ABNORMAL HIGH (ref 0–99)
NonHDL: 124.99
Total CHOL/HDL Ratio: 4
Triglycerides: 80 mg/dL (ref 0.0–149.0)
VLDL: 16 mg/dL (ref 0.0–40.0)

## 2021-04-05 LAB — HEPATIC FUNCTION PANEL
ALT: 21 U/L (ref 0–35)
AST: 18 U/L (ref 0–37)
Albumin: 4.1 g/dL (ref 3.5–5.2)
Alkaline Phosphatase: 61 U/L (ref 39–117)
Bilirubin, Direct: 0.1 mg/dL (ref 0.0–0.3)
Total Bilirubin: 0.4 mg/dL (ref 0.2–1.2)
Total Protein: 7 g/dL (ref 6.0–8.3)

## 2021-04-05 NOTE — Progress Notes (Signed)
Subjective:    Patient ID: Alison Rodriguez, female    DOB: [birth year], 60 y.o.   MRN: [id number]  HPI This visit occurred during the SARS-CoV-2 public health emergency.  Safety protocols were in place, including screening questions prior to the visit, additional usage of staff PPE, and extensive cleaning of exam room while observing appropriate contact time as indicated for disinfecting solutions.  Patient here for a scheduled follow up.  Here to follow up regarding her cholesterol and to discuss weight loss.  Insurance approved ozempic.  Discussed low carb diet and exercise.  Tries to stay active.  No chest pain or sob reported.  No abdominal pain or bowel change reported.   No cough or congestion.   Past Medical History:  Diagnosis Date  . Blood in stool    H/O  . Hyperlipidemia   . Hypertension   . Thyroid disease    Past Surgical History:  Procedure Laterality Date  . COLONOSCOPY WITH PROPOFOL N/A 02/04/2021   Procedure: COLONOSCOPY WITH PROPOFOL;  Surgeon: Lucilla Lame, MD;  Location: [facility];  Service: Endoscopy;  Laterality: N/A;  priority 4  . POLYPECTOMY  02/04/2021   Procedure: POLYPECTOMY;  Surgeon: Lucilla Lame, MD;  Location: [facility];  Service: Endoscopy;;  . UTERINE FIBROID EMBOLIZATION     Family History  Problem Relation Age of Onset  . Diabetes Mother   . Diabetes Maternal Grandmother   . Arthritis Maternal Grandfather   . Diabetes Maternal Grandfather   . Breast cancer Other        second cousins/aunt   Social History   Socioeconomic History  . Marital status: Married    Spouse name: Not on file  . Number of children: Not on file  . Years of education: Not on file  . Highest education level: Not on file  Occupational History  . Not on file  Tobacco Use  . Smoking status: Passive Smoke Exposure - Never Smoker  . Smokeless tobacco: Never Used  Substance and Sexual Activity  . Alcohol use: No    Alcohol/week: 0.0 standard drinks   . Drug use: No  . Sexual activity: Not on file  Other Topics Concern  . Not on file  Social History Narrative  . Not on file   Social Determinants of Health   Financial Resource Strain: Low Risk   . Difficulty of Paying Living Expenses: Not hard at all  Food Insecurity: Not on file  Transportation Needs: Not on file  Physical Activity: Not on file  Stress: Not on file  Social Connections: Not on file    Outpatient Encounter Medications as of 04/05/2021  Medication Sig  . Azelaic Acid (FINACEA) 15 % FOAM Apply to face 1-2 times a day for acne.  . Multiple Vitamins-Minerals (MULTIVITAMIN ADULT, MINERALS,) TABS Take 1 tablet by mouth daily.  . rosuvastatin (CRESTOR) 10 MG tablet TAKE 1 TABLET BY MOUTH EVERY DAY  . tretinoin (RETIN-A) 0.05 % cream Apply a pea-sized amount to face every night as tolerated for acne.  . [DISCONTINUED] hydrocortisone (ANUSOL-HC) 25 MG suppository Place 1 suppository (25 mg total) rectally 2 (two) times daily. (Patient not taking: Reported on 01/24/2021)   No facility-administered encounter medications on file as of 04/05/2021.    Review of Systems  Constitutional: Negative for appetite change and unexpected weight change.  HENT: Negative for congestion and sinus pressure.   Respiratory: Negative for cough, chest tightness and shortness of breath.   Cardiovascular: Negative for chest pain,  palpitations and leg swelling.  Gastrointestinal: Negative for abdominal pain, diarrhea, nausea and vomiting.  Genitourinary: Negative for difficulty urinating and dysuria.  Musculoskeletal: Negative for joint swelling and myalgias.  Skin: Negative for color change and rash.  Neurological: Negative for dizziness, light-headedness and headaches.  Psychiatric/Behavioral: Negative for agitation and dysphoric mood.       Objective:    Physical Exam Vitals reviewed.  Constitutional:      General: She is not in acute distress.    Appearance: Normal appearance.   HENT:     Head: Normocephalic and atraumatic.     Right Ear: External ear normal.     Left Ear: External ear normal.  Eyes:     General: No scleral icterus.       Right eye: No discharge.        Left eye: No discharge.     Conjunctiva/sclera: Conjunctivae normal.  Neck:     Thyroid: No thyromegaly.  Cardiovascular:     Rate and Rhythm: Normal rate and regular rhythm.  Pulmonary:     Effort: No respiratory distress.     Breath sounds: Normal breath sounds. No wheezing.  Abdominal:     General: Bowel sounds are normal.     Palpations: Abdomen is soft.     Tenderness: There is no abdominal tenderness.  Musculoskeletal:        General: No swelling or tenderness.     Cervical back: Neck supple. No tenderness.  Lymphadenopathy:     Cervical: No cervical adenopathy.  Skin:    Findings: No erythema or rash.  Neurological:     Mental Status: She is alert.  Psychiatric:        Mood and Affect: Mood normal.        Behavior: Behavior normal.     BP 124/78   Pulse 70   Temp 98.1 F (36.7 C) (Oral)   Resp 16   Ht 4\' 11"  (1.499 m)   Wt 170 lb (77.1 kg)   LMP 06/22/2015 (Approximate)   SpO2 98%   BMI 34.34 kg/m  Wt Readings from Last 3 Encounters:  04/05/21 170 lb (77.1 kg)  02/04/21 167 lb (75.8 kg)  12/28/20 170 lb (77.1 kg)     Lab Results  Component Value Date   WBC 6.0 10/06/2020   HGB 14.2 10/06/2020   HCT 43.5 10/06/2020   PLT 273.0 10/06/2020   GLUCOSE 74 04/05/2021   CHOL 171 04/05/2021   TRIG 80.0 04/05/2021   HDL 46.20 04/05/2021   LDLCALC 109 (H) 04/05/2021   ALT 21 04/05/2021   AST 18 04/05/2021   NA 138 04/05/2021   K 4.2 04/05/2021   CL 104 04/05/2021   CREATININE 0.56 04/05/2021   BUN 13 04/05/2021   CO2 27 04/05/2021   TSH 1.42 10/06/2020       Assessment & Plan:   Problem List Items Addressed This Visit    BMI 34.0-34.9,adult    Diet and exercise.  ozempic - insurance approved.  Discussed starting ozempic.  Discussed possible side  effects.  Sample given.  Nurse instructed on proper way to administer the medication.  Call with update - if any problems.       Hypercholesterolemia    Continue crestor.  Low cholesterol diet and exercise.  Follow lipid panel and liver function tests.           Einar Pheasant, MD

## 2021-04-10 ENCOUNTER — Encounter: Payer: Self-pay | Admitting: Internal Medicine

## 2021-04-10 NOTE — Assessment & Plan Note (Signed)
Continue crestor.  Low cholesterol diet and exercise. Follow lipid panel and liver function tests.   

## 2021-04-10 NOTE — Assessment & Plan Note (Addendum)
Diet and exercise.  ozempic - insurance approved.  Discussed starting ozempic.  Discussed possible side effects.  Sample given.  Nurse instructed on proper way to administer the medication.  Call with update - if any problems.

## 2021-04-24 ENCOUNTER — Encounter: Payer: Self-pay | Admitting: Internal Medicine

## 2021-04-25 ENCOUNTER — Other Ambulatory Visit: Payer: Self-pay

## 2021-04-25 MED ORDER — PEN NEEDLES 32G X 4 MM MISC: 0 refills | Status: DC

## 2021-05-09 ENCOUNTER — Ambulatory Visit (INDEPENDENT_AMBULATORY_CARE_PROVIDER_SITE_OTHER): Payer: 59 | Admitting: Dermatology

## 2021-05-09 ENCOUNTER — Other Ambulatory Visit: Payer: Self-pay

## 2021-05-09 DIAGNOSIS — L810 Postinflammatory hyperpigmentation: Secondary | ICD-10-CM

## 2021-05-09 DIAGNOSIS — L820 Inflamed seborrheic keratosis: Secondary | ICD-10-CM | POA: Diagnosis not present

## 2021-05-09 MED ORDER — HYDROCORTISONE 2.5 % EX CREA: TOPICAL_CREAM | CUTANEOUS | 1 refills | Status: DC

## 2021-05-09 NOTE — Progress Notes (Signed)
   Follow-Up Visit   Subjective  Alison Rodriguez is a 60 y.o. female who presents for the following: Acne (With PIH, face. She is using tretinoin 0.05% cream nightly. She doesn't have any bumps, only dark patches. ) and Breaking out (Intermammary and inframammary chest. Was itchy, but improved now. ). Patient not on blood pressure meds and not on any estrogen.  She never gets any bumps in dark areas and it tends to come and go over the years, gets worse in summer.    The following portions of the chart were reviewed this encounter and updated as appropriate:        Review of Systems:  No other skin or systemic complaints except as noted in HPI or Assessment and Plan.  Objective  Well appearing patient in no apparent distress; mood and affect are within normal limits.  A focused examination was performed including face, chest. Relevant physical exam findings are noted in the Assessment and Plan.  face Hyperpigmented macules and patches.         Bilateral inframammary, intermammary Tiny waxy brown papules.   Assessment & Plan  Post-inflammatory hyperpigmentation face  vs Melasma  Melasma is a condition of persistent pigmented patches generally on the face, worse in summer due to higher UV exposure.  Oral estrogen containing BCPs or supplements can exacerbate condition.  Recommend daily broad spectrum tinted sunscreen SPF 30+ to face, preferably with Zinc or Titanium Dioxide. Discussed Rx topical bleaching creams (i.e. hydroquinone), OTC HelioCare supplement, chemical peels (would need multiple for best result).    Start SM Hydroquinone 8%, Tretinoin 0.025%, Kojic Acid 1%, Niacinamide 4%, Fluocinolone 0.025% Cream Apply to AA face QHS x 2-3 months.  After 3 months, switch to tretinoin 0.05% cream QHS. Pt has.  Continue spf 30 sunscreen QAM with UVA protection, pt has.    Inflamed seborrheic keratosis Bilateral inframammary, intermammary  Hx of itching.  Start  hydrocortisone 2.5% cream Apply QD/BID to Aas prn itch. Dsp 30g 1Rf.  Reassured benign age-related growth.  Recommend observation.  Discussed cryotherapy vs snip excision if spot(s) become irritated or inflamed.      hydrocortisone 2.5 % cream - Bilateral inframammary, intermammary Apply to bumps on chest 1-2 times a day as needed for itch.  Return in about 3 months (around 08/09/2021) for melasma.  IJamesetta Orleans, CMA, am acting as scribe for Brendolyn Patty, MD . Documentation: I have reviewed the above documentation for accuracy and completeness, and I agree with the above.  Brendolyn Patty MD

## 2021-05-09 NOTE — Patient Instructions (Addendum)
Instructions for [facility] Medications  One or more of your medications was sent to the [facility] mail order compounding pharmacy. You will receive an email from them and can purchase the medicine through that link. It will then be mailed to your home at the address you confirmed. If for any reason you do not receive an email from them, please check your spam folder. If you still do not find the email, please let us know. [facility] phone number is [phone].   If you have any questions or concerns for your doctor, please call our main line at [phone] and press option 4 to reach your doctor's medical assistant. If no one answers, please leave a voicemail as directed and we will return your call as soon as possible. Messages left after 4 pm will be answered the following business day.   You may also send us a message via MyChart. We typically respond to MyChart messages within 1-2 business days.  For prescription refills, please ask your pharmacy to contact our office. Our fax number is [phone].  If you have an urgent issue when the clinic is closed that cannot wait until the next business day, you can page your doctor at the number below.    Please note that while we do our best to be available for urgent issues outside of office hours, we are not available 24/7.   If you have an urgent issue and are unable to reach us, you may choose to seek medical care at your doctor's office, retail clinic, urgent care center, or emergency room.  If you have a medical emergency, please immediately call 911 or go to the emergency department.  Pager Numbers  - Dr. Kowalski: [phone]  - Dr. Moye: [phone]  - Dr. Stewart: [phone]  In the event of inclement weather, please call our main line at [phone] for an update on the status of any delays or closures.  Dermatology Medication Tips: Please keep the boxes that topical medications come in in order to help  keep track of the instructions about where and how to use these. Pharmacies typically print the medication instructions only on the boxes and not directly on the medication tubes.   If your medication is too expensive, please contact our office at [phone] option 4 or send us a message through MyChart.   We are unable to tell what your co-pay for medications will be in advance as this is different depending on your insurance coverage. However, we may be able to find a substitute medication at lower cost or fill out paperwork to get insurance to cover a needed medication.   If a prior authorization is required to get your medication covered by your insurance company, please allow us 1-2 business days to complete this process.  Drug prices often vary depending on where the prescription is filled and some pharmacies may offer cheaper prices.  The website www.goodrx.com contains coupons for medications through different pharmacies. The prices here do not account for what the cost may be with help from insurance (it may be cheaper with your insurance), but the website can give you the price if you did not use any insurance.  - You can print the associated coupon and take it with your prescription to the pharmacy.  - You may also stop by our office during regular business hours and pick up a GoodRx coupon card.  - If you need your prescription sent electronically to a different pharmacy, notify our office   through  MyChart or by phone at [phone] option 4.  

## 2021-05-11 ENCOUNTER — Ambulatory Visit: Payer: 59 | Admitting: Pharmacist

## 2021-05-11 DIAGNOSIS — Z6834 Body mass index (BMI) 34.0-34.9, adult: Secondary | ICD-10-CM

## 2021-05-11 DIAGNOSIS — R7301 Impaired fasting glucose: Secondary | ICD-10-CM

## 2021-05-11 DIAGNOSIS — E7800 Pure hypercholesterolemia, unspecified: Secondary | ICD-10-CM

## 2021-05-11 MED ORDER — OZEMPIC (0.25 OR 0.5 MG/DOSE) 2 MG/1.5ML ~~LOC~~ SOPN: 0.5000 mg | PEN_INJECTOR | SUBCUTANEOUS | 2 refills | Status: DC

## 2021-05-11 NOTE — Chronic Care Management (AMB) (Signed)
Chronic Care Management Pharmacy Note  05/11/2021 Name:  Alison Rodriguez MRN:  [id number] DOB:  05-27-1961  Subjective: Alison Rodriguez is an 60 y.o. year old female who is a primary patient of Einar Pheasant, MD.  The CCM team was consulted for assistance with disease management and care coordination needs.    Engaged with patient by telephone for follow up visit in response to provider referral for pharmacy case management and/or care coordination services.   Consent to Services:  The patient was given information about Chronic Care Management services, agreed to services, and gave verbal consent prior to initiation of services.  Please see initial visit note for detailed documentation.   Patient Care Team: Einar Pheasant, MD as PCP - General (Internal Medicine) De Hollingshead, RPH-CPP (Pharmacist)  Recent office visits: 5/17 - started Ozempic; LDL 109  - PCP recommended increase in statin dose, but patient declined.   Recent consult visits: 3/23 - dermatology - add tretinoin, azelaic acid foam 6/20 - dermatology - add hydroquinone, continue sunscreen  Hospital visits: None in previous 6 months  Objective:  Lab Results  Component Value Date   CREATININE 0.56 04/05/2021   CREATININE 0.65 10/07/2020   CREATININE 0.62 03/29/2020    No results found for: HGBA1C Last diabetic Eye exam: No results found for: HMDIABEYEEXA  Last diabetic Foot exam: No results found for: HMDIABFOOTEX      Component Value Date/Time   CHOL 171 04/05/2021 0857   TRIG 80.0 04/05/2021 0857   HDL 46.20 04/05/2021 0857   CHOLHDL 4 04/05/2021 0857   VLDL 16.0 04/05/2021 0857   LDLCALC 109 (H) 04/05/2021 0857    Hepatic Function Latest Ref Rng & Units 04/05/2021 10/06/2020 03/29/2020  Total Protein 6.0 - 8.3 g/dL 7.0 7.1 7.6  Albumin 3.5 - 5.2 g/dL 4.1 4.0 4.2  AST 0 - 37 U/L _0 ALT 0 - 35 U/L _1 Alk Phosphatase 39 - 117 U/L 61 59 58  Total Bilirubin 0.2 - 1.2 mg/dL 0.4  0.4 0.3  Bilirubin, Direct 0.0 - 0.3 mg/dL 0.1 0.1 0.1    Lab Results  Component Value Date/Time   TSH 1.42 10/06/2020 08:57 AM   TSH 1.53 09/17/2019 08:35 AM    CBC Latest Ref Rng & Units 10/06/2020 09/17/2019 03/13/2018  WBC 4.0 - 10.5 K/uL 6.0 6.4 5.1  Hemoglobin 12.0 - 15.0 g/dL 14.2 14.5 14.3  Hematocrit 36.0 - 46.0 % 43.5 42.1 43.5  Platelets 150.0 - 400.0 K/uL 273.0 292.0 291.0    No results found for: VD25OH  Clinical ASCVD: No  The 10-year ASCVD risk score Mikey Bussing DC Jr., et al., 2013) is: 3.5%   Values used to calculate the score:     Age: 60 years     Sex: Female     Is Non-Hispanic African American: Yes     Diabetic: No     Tobacco smoker: No     Systolic Blood Pressure: 229 mmHg     Is BP treated: No     HDL Cholesterol: 46.2 mg/dL     Total Cholesterol: 171 mg/dL    Social History   Tobacco Use  Smoking Status Passive Smoke Exposure - Never Smoker  Smokeless Tobacco Never   BP Readings from Last 3 Encounters:  04/05/21 124/78  02/04/21 114/76  12/28/20 120/70   Pulse Readings from Last 3 Encounters:  04/05/21 70  02/04/21 70  12/28/20 80   Wt Readings from Last 3 Encounters:  04/05/21 170 lb (77.1 kg)  02/04/21 167 lb (75.8 kg)  12/28/20 170 lb (77.1 kg)    Assessment: Review of patient past medical history, allergies, medications, health status, including review of consultants reports, laboratory and other test data, was performed as part of comprehensive evaluation and provision of chronic care management services.   SDOH:  (Social Determinants of Health) assessments and interventions performed:    CCM Care Plan  No Known Allergies  Medications Reviewed Today     Reviewed by Joyce Gross, CMA (Certified Medical Assistant) on 05/09/21 at [phone]  Med List Status: <None>   Medication Order Taking? Sig Documenting Provider Last Dose Status Informant    Discontinued 05/09/21 0846 (Change in therapy)   Insulin Pen Needle (PEN NEEDLES) 32G  X 4 MM MISC [id number]  Used to give Ozempic injections. Einar Pheasant, MD  Active   Multiple Vitamins-Minerals (MULTIVITAMIN ADULT, MINERALS,) TABS [id number] No Take 1 tablet by mouth daily. [provider] Past Week Unknown time Active Self  rosuvastatin (CRESTOR) 10 MG tablet [id number]  TAKE 1 TABLET BY MOUTH EVERY DAY Einar Pheasant, MD  Active   tretinoin (RETIN-A) 0.05 % cream [id number]  Apply a pea-sized amount to face every night as tolerated for acne. Brendolyn Patty, MD  Active             Patient Active Problem List   Diagnosis Date Noted   Special screening for malignant neoplasms, colon    Polyp of descending colon    BMI 34.0-34.9,adult 01/02/2021   Cough 10/10/2020   Pain of right heel 03/30/2020   Right arm pain 03/30/2020   Colon cancer screening 03/30/2020   Postmenopausal bleeding 09/17/2019   Rash 09/17/2019   Vaginitis 05/12/2019   Herpes zoster 05/10/2018   Urinary frequency 05/10/2018   Trichomonas infection 05/10/2018   Abnormal chest CT 02/07/2016   Health care maintenance 01/25/2015   Dysuria 07/19/2014   Hemorrhoids 11/30/2013   Abnormal Pap smear of cervix 11/30/2013   Chest tightness 11/30/2013   Headache 11/26/2013   Hypercholesterolemia 11/26/2013   Blood pressure elevated without history of HTN 11/26/2013    Immunization History  Administered Date(s) Administered   Influenza Split 10/26/2013   Influenza,inj,Quad PF,6+ Mos 09/11/2017, 10/04/2020   Influenza-Unspecified 09/14/2014   Janssen (J&J) SARS-COV-2 Vaccination 01/31/2020   PFIZER Comirnaty(Gray Top)Covid-19 Tri-Sucrose Vaccine 04/02/2021   PFIZER(Purple Top)SARS-COV-2 Vaccination 06/09/2020    Conditions to be addressed/monitored: HLD and obesity  Care Plan : Disease Progression Prevention  Updates made by De Hollingshead, RPH-CPP since 05/11/2021 12:00 AM     Problem: Obesity, HLD, GERD      Long-Range Goal: Disease Progression Prevention   This Visit's  Progress: On track  Recent Progress: On track  Priority: High  Note:   Current Barriers:  Unable to achieve control of obesity   Pharmacist Clinical Goal(s):  Over the next 90 days, patient will achieve control of obesity as evidenced by weight through collaboration with PharmD and provider.   Interventions: 1:1 collaboration with Einar Pheasant, MD regarding development and update of comprehensive plan of care as evidenced by provider attestation and co-signature Inter-disciplinary care team collaboration (see longitudinal plan of care) Comprehensive medication review performed; medication list updated in electronic medical record  Obesity with history of impaired fasting glucose: Unable to independently achieve weight loss with diet and exercise alone. Current regimen: Ozempic 0.25 mg weekly Prior medications for weight loss tried: none  Baseline weight: 170 lbs; most recent weight at home:  165 lbs, weight loss thus far 5 lbs (~ 3% from baseline) Goal weight in the next 6 months: 130 lbs Current dietary patterns: "intermittent fasting;" breakfast: usually skips; on the weekends egg/sausage/egg McMuffin; lunch/dinner: leftovers at restaurants - 3-4x times per weekend; salmon and broccoli; hibachi, chick fil la; snacks: desserts (banana cheesecake, key lime, cupcakes); drinks: [place] dry, water, caffeinated coffee Current exercise patterns: notes that she has not been walking as much lately.  Educated to continue to focus on healthy food choices, including lean meats, fresh fruits and vegetables, increased fiber intake, even if reduced appetite. Discussed importance of incorporating adequate hydration Recommend increasing Ozempic to 0.5 mg weekly. Patient verbalizes understanding.   Hyperlipidemia: Uncontrolled; current treatment: rosuvastatin 10 mg daily; PCP recommended dose increase but patient declined Previously recommended to continue current regimen at this time. Evaluate future  lipid panels to determine need for rosuvastatin dose increase moving forward.  GERD: Controlled; current treatment: none   Previously recommended avoiding spicy foods that may exacerbate symptoms   Patient Goals/Self-Care Activities Over the next 90 days, patient will:  - target a minimum of 150 minutes of moderate intensity exercise weekly engage in dietary modifications by reducing portion sizes, cutting back on high sugars, and increasing fiber intake.   Follow Up Plan: Telephone appointment scheduled in :~ 5 weeks      Medication Assistance: None required.  Patient affirms current coverage meets needs.  Patient's preferred pharmacy is:  [facility] [place], [place] [address] [place] [address] Phone: [phone] Fax: [phone]    Follow Up:  Patient agrees to Care Plan and Follow-up.  Plan: Telephone follow up appointment with care management team member scheduled for:  ~ 5 weeks  Catie Darnelle Maffucci, PharmD, [place], [place] Clinical Pharmacist [employer] [facility] [phone]

## 2021-05-11 NOTE — Patient Instructions (Signed)
Visit Information   Goals Addressed               This Visit's Progress     Patient Stated     Medication Monitoring (pt-stated)        Patient Goals/Self-Care Activities Over the next 90 days, patient will:  - target a minimum of 150 minutes of moderate intensity exercise weekly engage in dietary modifications by reducing portion sizes, cutting back on high sugars, and increasing fiber intake.            Patient verbalizes understanding of instructions provided today and agrees to view in [place].   Plan: Telephone follow up appointment with care management team member scheduled for:  ~ 5 weeks  Catie Darnelle Maffucci, PharmD, [place], [place] Clinical Pharmacist [employer] [facility] [phone]

## 2021-06-22 ENCOUNTER — Telehealth: Payer: 59

## 2021-06-29 ENCOUNTER — Ambulatory Visit: Payer: 59 | Admitting: Pharmacist

## 2021-06-29 VITALS — Wt 161.0 lb

## 2021-06-29 DIAGNOSIS — Z6832 Body mass index (BMI) 32.0-32.9, adult: Secondary | ICD-10-CM

## 2021-06-29 DIAGNOSIS — E7800 Pure hypercholesterolemia, unspecified: Secondary | ICD-10-CM

## 2021-06-29 DIAGNOSIS — R7301 Impaired fasting glucose: Secondary | ICD-10-CM

## 2021-06-29 MED ORDER — OZEMPIC (1 MG/DOSE) 4 MG/3ML ~~LOC~~ SOPN
1.0000 mg | PEN_INJECTOR | SUBCUTANEOUS | 2 refills | Status: DC
Start: 2021-06-29 — End: 2021-08-09

## 2021-06-29 NOTE — Chronic Care Management (AMB) (Signed)
Care Management   Pharmacy Note  06/29/2021 Name: Alison Rodriguez MRN: [id number] DOB: [birth year]  Subjective: Alison Rodriguez is a 60 y.o. year old female who is a primary care patient of Einar Pheasant, MD. The Care Management team was consulted for assistance with care management and care coordination needs.    Engaged with patient by telephone for follow up visit in response to provider referral for pharmacy case management and/or care coordination services.   The patient was given information about Care Management services today including:  Care Management services includes personalized support from designated clinical staff supervised by the patient's primary care provider, including individualized plan of care and coordination with other care providers. 24/7 contact phone numbers for assistance for urgent and routine care needs. The patient may stop case management services at any time by phone call to the office staff.  Patient agreed to services and consent obtained.  Assessment:  Review of patient status, including review of consultants reports, laboratory and other test data, was performed as part of comprehensive evaluation and provision of chronic care management services.   SDOH (Social Determinants of Health) assessments and interventions performed:  SDOH Interventions    Flowsheet Row Most Recent Value  SDOH Interventions   Financial Strain Interventions Intervention Not Indicated        Objective:  Lab Results  Component Value Date   CREATININE 0.56 04/05/2021   CREATININE 0.65 10/07/2020   CREATININE 0.62 03/29/2020    No results found for: HGBA1C     Component Value Date/Time   CHOL 171 04/05/2021 0857   TRIG 80.0 04/05/2021 0857   HDL 46.20 04/05/2021 0857   CHOLHDL 4 04/05/2021 0857   VLDL 16.0 04/05/2021 0857   LDLCALC 109 (H) 04/05/2021 0857     Clinical ASCVD: No  The 10-year ASCVD risk score Mikey Bussing DC Jr., et al., 2013) is: 3.5%   Values  used to calculate the score:     Age: 81 years     Sex: Female     Is Non-Hispanic African American: Yes     Diabetic: No     Tobacco smoker: No     Systolic Blood Pressure: [id number] mmHg     Is BP treated: No     HDL Cholesterol: 46.2 mg/dL     Total Cholesterol: 171 mg/dL     BP Readings from Last 3 Encounters:  04/05/21 124/78  02/04/21 114/76  12/28/20 120/70    Care Plan  No Known Allergies  Medications Reviewed Today     Reviewed by Joyce Gross, CMA (Certified Medical Assistant) on 05/09/21 at [phone]  Med List Status: <None>   Medication Order Taking? Sig Documenting Provider Last Dose Status Informant    Discontinued 05/09/21 0846 (Change in therapy)   Insulin Pen Needle (PEN NEEDLES) 32G X 4 MM MISC [id number]  Used to give Ozempic injections. Einar Pheasant, MD  Active   Multiple Vitamins-Minerals (MULTIVITAMIN ADULT, MINERALS,) TABS [id number] No Take 1 tablet by mouth daily. [provider] Past Week Unknown time Active Self  rosuvastatin (CRESTOR) 10 MG tablet [id number]  TAKE 1 TABLET BY MOUTH EVERY DAY Einar Pheasant, MD  Active   tretinoin (RETIN-A) 0.05 % cream [id number]  Apply a pea-sized amount to face every night as tolerated for acne. Brendolyn Patty, MD  Active             Patient Active Problem List   Diagnosis Date Noted   Special screening for malignant neoplasms, colon  Polyp of descending colon    BMI 34.0-34.9,adult 01/02/2021   Cough 10/10/2020   Pain of right heel 03/30/2020   Right arm pain 03/30/2020   Colon cancer screening 03/30/2020   Postmenopausal bleeding 09/17/2019   Rash 09/17/2019   Vaginitis 05/12/2019   Herpes zoster 05/10/2018   Urinary frequency 05/10/2018   Trichomonas infection 05/10/2018   Abnormal chest CT 02/07/2016   Health care maintenance 01/25/2015   Dysuria 07/19/2014   Hemorrhoids 11/30/2013   Abnormal Pap smear of cervix 11/30/2013   Chest tightness 11/30/2013   Headache 11/26/2013    Hypercholesterolemia 11/26/2013   Blood pressure elevated without history of HTN 11/26/2013    Conditions to be addressed/monitored: HLD and obesity  Care Plan : Disease Progression Prevention  Updates made by De Hollingshead, RPH-CPP since 06/29/2021 12:00 AM     Problem: Obesity, HLD, GERD      Long-Range Goal: Disease Progression Prevention   This Visit's Progress: On track  Recent Progress: On track  Priority: High  Note:   Current Barriers:  Unable to achieve control of obesity   Pharmacist Clinical Goal(s):  Over the next 90 days, patient will achieve control of obesity as evidenced by weight through collaboration with PharmD and provider.   Interventions: 1:1 collaboration with Einar Pheasant, MD regarding development and update of comprehensive plan of care as evidenced by provider attestation and co-signature Inter-disciplinary care team collaboration (see longitudinal plan of care) Comprehensive medication review performed; medication list updated in electronic medical record  Obesity with history of impaired fasting glucose: Unable to independently achieve weight loss with diet and exercise alone. Current regimen: Ozempic 0.5 mg weekly Prior medications for weight loss tried: none  Baseline weight: 170 lbs; most recent weight at home: 161 lbs, weight loss thus far 9 lbs (~ 5% from baseline) Goal weight in the next 6 months: 130 lbs Current dietary patterns: "intermittent fasting;" two meals daily usually, eating 11:30-12 pm and ~ 6-7 pm. Reports trying to focus on proteins, vegetables.  Current exercise patterns: reports that she likes doing yoga. Plans to incorporate more of that.  Praised for focus on dietary choices. Discussed quick protein options if she isn't hungry but knows she needs to eat. Discussed increase in physical activity and praised for focus on yoga.  Recommend increasing Ozempic to 1 mg weekly. Patient has ~ 3 week supply of 0.5 mg remaining.  This should give time to obtain Ozempic 1 mg given stocking issues. Encouraged her to let me know if issues obtaining from current pharmacy.  Hyperlipidemia: Uncontrolled; current treatment: rosuvastatin 10 mg daily; PCP recommended dose increase but patient declined Previously recommended to continue current regimen at this time. Evaluate future lipid panels to determine need for rosuvastatin dose increase moving forward.  GERD: Controlled; current treatment: none   Previously recommended avoiding spicy foods that may exacerbate symptoms   Patient Goals/Self-Care Activities Over the next 90 days, patient will:  - target a minimum of 150 minutes of moderate intensity exercise weekly engage in dietary modifications by reducing portion sizes, cutting back on high sugars, and increasing fiber intake.   Follow Up Plan: Telephone appointment scheduled in :~ 6 weeks      Medication Assistance:  None required.  Patient affirms current coverage meets needs.  Follow Up:  Patient agrees to Care Plan and Follow-up.  Plan: Telephone follow up appointment with care management team member scheduled for:  ~ 6 weeks  Catie Darnelle Maffucci, PharmD, [place], [place] Clinical Pharmacist [place]  [facility] [phone]

## 2021-06-29 NOTE — Patient Instructions (Signed)
Visit Information   Goals Addressed               This Visit's Progress     Patient Stated     Medication Monitoring (pt-stated)        Patient Goals/Self-Care Activities Over the next 90 days, patient will:  - target a minimum of 150 minutes of moderate intensity exercise weekly engage in dietary modifications by reducing portion sizes, cutting back on high sugars, and increasing fiber intake.         Patient verbalizes understanding of instructions provided today and agrees to view in [place].   Plan: Telephone follow up appointment with care management team member scheduled for:  ~ 6 weeks  Catie Darnelle Maffucci, PharmD, [place], [place] Clinical Pharmacist [employer] [facility] [phone]

## 2021-07-05 ENCOUNTER — Other Ambulatory Visit: Payer: Self-pay | Admitting: Internal Medicine

## 2021-07-06 NOTE — Telephone Encounter (Signed)
Catie - can you help with this.  Thanks.  Let me know if I need to do anything.

## 2021-07-08 ENCOUNTER — Encounter: Payer: Self-pay | Admitting: Internal Medicine

## 2021-07-08 ENCOUNTER — Other Ambulatory Visit: Payer: Self-pay

## 2021-07-08 ENCOUNTER — Ambulatory Visit (INDEPENDENT_AMBULATORY_CARE_PROVIDER_SITE_OTHER): Payer: 59 | Admitting: Internal Medicine

## 2021-07-08 VITALS — BP 122/78 | HR 88 | Temp 97.0°F | Resp 16 | Ht 59.0 in | Wt 161.0 lb

## 2021-07-08 DIAGNOSIS — R21 Rash and other nonspecific skin eruption: Secondary | ICD-10-CM

## 2021-07-08 DIAGNOSIS — E7800 Pure hypercholesterolemia, unspecified: Secondary | ICD-10-CM

## 2021-07-08 LAB — BASIC METABOLIC PANEL
BUN: 9 mg/dL (ref 6–23)
CO2: 26 mEq/L (ref 19–32)
Calcium: 9.1 mg/dL (ref 8.4–10.5)
Chloride: 104 mEq/L (ref 96–112)
Creatinine, Ser: 0.66 mg/dL (ref 0.40–1.20)
GFR: 97.19 mL/min (ref >60.00)
Glucose, Bld: 73 mg/dL (ref 70–99)
Potassium: 3.9 mEq/L (ref 3.5–5.1)
Sodium: 137 mEq/L (ref 135–145)

## 2021-07-08 LAB — LIPID PANEL
Cholesterol: 149 mg/dL (ref 0–200)
HDL: 49 mg/dL (ref >39.00)
LDL Cholesterol: 87 mg/dL (ref 0–99)
NonHDL: 99.71
Total CHOL/HDL Ratio: 3
Triglycerides: 63 mg/dL (ref 0.0–149.0)
VLDL: 12.6 mg/dL (ref 0.0–40.0)

## 2021-07-08 LAB — HEPATIC FUNCTION PANEL
ALT: 15 U/L (ref 0–35)
AST: 13 U/L (ref 0–37)
Albumin: 4 g/dL (ref 3.5–5.2)
Alkaline Phosphatase: 59 U/L (ref 39–117)
Bilirubin, Direct: 0.1 mg/dL (ref 0.0–0.3)
Total Bilirubin: 0.3 mg/dL (ref 0.2–1.2)
Total Protein: 6.9 g/dL (ref 6.0–8.3)

## 2021-07-08 NOTE — Progress Notes (Signed)
Patient ID: Alison Rodriguez, female   DOB: 1961/09/11, 60 y.o.   MRN: [id number]   Subjective:    Patient ID: Alison Rodriguez, female    DOB: 02/11/1961, 60 y.o.   MRN: [id number]  HPI This visit occurred during the SARS-CoV-2 public health emergency.  Safety protocols were in place, including screening questions prior to the visit, additional usage of staff PPE, and extensive cleaning of exam room while observing appropriate contact time as indicated for disinfecting solutions.   Patient here for a scheduled follow up. Here to follow up regarding cholesterol and weight loss.  She has been on ozempic.  Noticed a faint rash.  When she increased the dose of ozempic  - rash worsened.   Rash - chest, back and abdomen.  No rash on her legs or face.  Fine bumps.  Itches.  No other new exposures or contacts.  Relates to the ozempic.  Last inection - Sunday - 07/03/21.  No fever.  No lip or tongue swelling.  No cough, congestion or sob.  No acid reflux.  No abdominal pain.  Bowels moving.  Blood pressure doing well.  .  Past Medical History:  Diagnosis Date   Blood in stool    H/O   Hyperlipidemia    Hypertension    Thyroid disease    Past Surgical History:  Procedure Laterality Date   COLONOSCOPY WITH PROPOFOL N/A 02/04/2021   Procedure: COLONOSCOPY WITH PROPOFOL;  Surgeon: Lucilla Lame, MD;  Location: [facility];  Service: Endoscopy;  Laterality: N/A;  priority 4   POLYPECTOMY  02/04/2021   Procedure: POLYPECTOMY;  Surgeon: Lucilla Lame, MD;  Location: [facility];  Service: Endoscopy;;   UTERINE FIBROID EMBOLIZATION     Family History  Problem Relation Age of Onset   Diabetes Mother    Diabetes Maternal Grandmother    Arthritis Maternal Grandfather    Diabetes Maternal Grandfather    Breast cancer Other        second cousins/aunt   Social History   Socioeconomic History   Marital status: Married    Spouse name: Not on file   Number of children: Not on file   Years of  education: Not on file   Highest education level: Not on file  Occupational History   Not on file  Tobacco Use   Smoking status: Passive Smoke Exposure - Never Smoker   Smokeless tobacco: Never  Substance and Sexual Activity   Alcohol use: No    Alcohol/week: 0.0 standard drinks   Drug use: No   Sexual activity: Not on file  Other Topics Concern   Not on file  Social History Narrative   Not on file   Social Determinants of Health   Financial Resource Strain: Low Risk    Difficulty of Paying Living Expenses: Not hard at all  Food Insecurity: Not on file  Transportation Needs: Not on file  Physical Activity: Not on file  Stress: Not on file  Social Connections: Not on file    Review of Systems  Constitutional:  Negative for appetite change.       Has lost some weight with ozempic.   HENT:  Negative for congestion and sinus pressure.   Respiratory:  Negative for cough, chest tightness and shortness of breath.   Cardiovascular:  Negative for chest pain, palpitations and leg swelling.  Gastrointestinal:  Negative for abdominal pain, diarrhea, nausea and vomiting.  Genitourinary:  Negative for difficulty urinating and dysuria.  Musculoskeletal:  Negative for  joint swelling and myalgias.  Skin:  Positive for rash. Negative for color change.  Neurological:  Negative for dizziness, light-headedness and headaches.  Psychiatric/Behavioral:  Negative for agitation and dysphoric mood.       Objective:    Physical Exam Vitals reviewed.  Constitutional:      General: She is not in acute distress.    Appearance: Normal appearance.  HENT:     Head: Normocephalic and atraumatic.     Right Ear: External ear normal.     Left Ear: External ear normal.  Eyes:     General: No scleral icterus.       Right eye: No discharge.        Left eye: No discharge.     Conjunctiva/sclera: Conjunctivae normal.  Neck:     Thyroid: No thyromegaly.  Cardiovascular:     Rate and Rhythm: Normal  rate and regular rhythm.  Pulmonary:     Effort: No respiratory distress.     Breath sounds: Normal breath sounds. No wheezing.  Abdominal:     General: Bowel sounds are normal.     Palpations: Abdomen is soft.     Tenderness: There is no abdominal tenderness.  Musculoskeletal:        General: No swelling or tenderness.     Cervical back: Neck supple. No tenderness.  Lymphadenopathy:     Cervical: No cervical adenopathy.  Skin:    Comments: Rash - fine raised bumps - chest, back, abdomen.    Neurological:     Mental Status: She is alert.  Psychiatric:        Mood and Affect: Mood normal.        Behavior: Behavior normal.    BP 122/78   Pulse 88   Temp (!) 97 F (36.1 C)   Resp 16   Ht '4\' 11"'$  (1.499 m)   Wt 161 lb (73 kg)   LMP 06/22/2015 (Approximate)   SpO2 98%   BMI 32.52 kg/m  Wt Readings from Last 3 Encounters:  07/08/21 161 lb (73 kg)  06/29/21 161 lb (73 kg)  04/05/21 170 lb (77.1 kg)    Outpatient Encounter Medications as of 07/08/2021  Medication Sig   hydrocortisone 2.5 % cream Apply to bumps on chest 1-2 times a day as needed for itch.   Insulin Pen Needle (PEN NEEDLES) 32G X 4 MM MISC Used to give Ozempic injections.   Multiple Vitamins-Minerals (MULTIVITAMIN ADULT, MINERALS,) TABS Take 1 tablet by mouth daily.   rosuvastatin (CRESTOR) 10 MG tablet TAKE 1 TABLET BY MOUTH EVERY DAY   Semaglutide, 1 MG/DOSE, (OZEMPIC, 1 MG/DOSE,) 4 MG/3ML SOPN Inject 1 mg into the skin once a week.   tretinoin (RETIN-A) 0.05 % cream Apply a pea-sized amount to face every night as tolerated for acne.   No facility-administered encounter medications on file as of 07/08/2021.     Lab Results  Component Value Date   WBC 6.0 10/06/2020   HGB 14.2 10/06/2020   HCT 43.5 10/06/2020   PLT 273.0 10/06/2020   GLUCOSE 73 07/08/2021   CHOL 149 07/08/2021   TRIG 63.0 07/08/2021   HDL 49.00 07/08/2021   LDLCALC 87 07/08/2021   ALT 15 07/08/2021   AST 13 07/08/2021   NA 137  07/08/2021   K 3.9 07/08/2021   CL 104 07/08/2021   CREATININE 0.66 07/08/2021   BUN 9 07/08/2021   CO2 26 07/08/2021   TSH 1.42 10/06/2020       Assessment &  Plan:   Problem List Items Addressed This Visit     Hypercholesterolemia - Primary    Continue crestor.  Low cholesterol diet and exercise.  Follow lipid panel and liver function tests.       Relevant Orders   Lipid panel (Completed)   Hepatic function panel (Completed)   Basic metabolic panel (Completed)   Rash    Rash - chest, back and abdomen.  Question if related to ozempic. Worsened with increased dose.  Will stop ozempic.  Zyrtec.  Call with update.  If persistent rash, will need dermatology to reevaluate.          Einar Pheasant, MD

## 2021-07-10 ENCOUNTER — Encounter: Payer: Self-pay | Admitting: Internal Medicine

## 2021-07-10 NOTE — Assessment & Plan Note (Signed)
Rash - chest, back and abdomen.  Question if related to ozempic. Worsened with increased dose.  Will stop ozempic.  Zyrtec.  Call with update.  If persistent rash, will need dermatology to reevaluate.

## 2021-07-10 NOTE — Assessment & Plan Note (Signed)
Continue crestor.  Low cholesterol diet and exercise. Follow lipid panel and liver function tests.   

## 2021-07-13 ENCOUNTER — Other Ambulatory Visit: Payer: Self-pay | Admitting: Internal Medicine

## 2021-07-13 DIAGNOSIS — Z1231 Encounter for screening mammogram for malignant neoplasm of breast: Secondary | ICD-10-CM

## 2021-08-08 ENCOUNTER — Encounter: Payer: Self-pay | Admitting: Internal Medicine

## 2021-08-08 NOTE — Telephone Encounter (Signed)
LMTCB

## 2021-08-09 ENCOUNTER — Telehealth (INDEPENDENT_AMBULATORY_CARE_PROVIDER_SITE_OTHER): Payer: 59 | Admitting: Internal Medicine

## 2021-08-09 DIAGNOSIS — U071 COVID-19: Secondary | ICD-10-CM

## 2021-08-09 NOTE — Progress Notes (Signed)
Patient ID: Alison Rodriguez, female   DOB: 1961-10-01, 60 y.o.   MRN: [id number]   Virtual Visit via video Note  This visit type was conducted due to national recommendations for restrictions regarding the COVID-19 pandemic (e.g. social distancing).  This format is felt to be most appropriate for this patient at this time.  All issues noted in this document were discussed and addressed.  No physical exam was performed (except for noted visual exam findings with Video Visits).   I connected with Sherrine Oman by a video enabled telemedicine application and verified that I am speaking with the correct person using two identifiers. Location patient: home Location provider: work  Persons participating in the virtual visit: patient, provider  The limitations, risks, security and privacy concerns of performing an evaluation and management service by video and the availability of in person appointments have been discussed.  It has also been discussed with the patient that there may be a patient responsible charge related to this service. The patient expressed understanding and agreed to proceed.   Reason for visit: work in appt  HPI: Work in - Covid positive.  States tested positive 08/06/21.  Symptoms started 08/04/21.  Went to [facility] 07/30/10.  Returned 08/02/21.  Had minimal diarrhea.  Noticed headache.  Coughing.  Runny nose.  Sore throat.  No chest pain or sob.  No fever.  States feeling better today.  Cough is better.  No chest pain or sob.  Only minimal sore throat.  Aching better.  Eating.  No nausea or vomiting.   ROS: See pertinent positives and negatives per HPI.  Past Medical History:  Diagnosis Date   Blood in stool    H/O   Hyperlipidemia    Hypertension    Thyroid disease     Past Surgical History:  Procedure Laterality Date   COLONOSCOPY WITH PROPOFOL N/A 02/04/2021   Procedure: COLONOSCOPY WITH PROPOFOL;  Surgeon: Lucilla Lame, MD;  Location: [place];  Service:  Endoscopy;  Laterality: N/A;  priority 4   POLYPECTOMY  02/04/2021   Procedure: POLYPECTOMY;  Surgeon: Lucilla Lame, MD;  Location: [facility];  Service: Endoscopy;;   UTERINE FIBROID EMBOLIZATION      Family History  Problem Relation Age of Onset   Diabetes Mother    Diabetes Maternal Grandmother    Arthritis Maternal Grandfather    Diabetes Maternal Grandfather    Breast cancer Other        second cousins/aunt    SOCIAL HX: reviewed.    Current Outpatient Medications:    hydrocortisone 2.5 % cream, Apply to bumps on chest 1-2 times a day as needed for itch., Disp: 30 g, Rfl: 1   Insulin Pen Needle (PEN NEEDLES) 32G X 4 MM MISC, Used to give Ozempic injections., Disp: 50 each, Rfl: 0   rosuvastatin (CRESTOR) 10 MG tablet, TAKE 1 TABLET BY MOUTH EVERY DAY, Disp: 30 tablet, Rfl: 11   Semaglutide, 1 MG/DOSE, (OZEMPIC, 1 MG/DOSE,) 4 MG/3ML SOPN, Inject 1 mg into the skin once a week., Disp: 3 mL, Rfl: 2   tretinoin (RETIN-A) 0.05 % cream, Apply a pea-sized amount to face every night as tolerated for acne., Disp: 45 g, Rfl: 2  EXAM:  GENERAL: alert, oriented, appears well and in no acute distress  HEENT: atraumatic, conjunttiva clear, no obvious abnormalities on inspection of external nose and ears  NECK: normal movements of the head and neck  LUNGS: on inspection no signs of respiratory distress, breathing rate  appears normal, no obvious gross SOB, gasping or wheezing  CV: no obvious cyanosis  PSYCH/NEURO: pleasant and cooperative, no obvious depression or anxiety, speech and thought processing grossly intact  ASSESSMENT AND PLAN:  Discussed the following assessment and plan:  Problem List Items Addressed This Visit     COVID-19 virus infection    Tested positive 08/06/21.  Symptoms started 08/04/21.  Feeling better. Discussed saline nasal spray and steroid nasal spray if needed for nasal congestion and drainage.  Aching and cough better.  Can use delsym/robitussin  DM if needed.  No chest pain or sob.  Rest.  Fluids.  Discussed quarantine guidelines.  Discussed oral antiviral medication.  She reports since feeling better, wants to hold on prescription medication at this time.  Follow.  Call with update.         Return if symptoms worsen or fail to improve.   I discussed the assessment and treatment plan with the patient. The patient was provided an opportunity to ask questions and all were answered. The patient agreed with the plan and demonstrated an understanding of the instructions.   The patient was advised to call back or seek an in-person evaluation if the symptoms worsen or if the condition fails to improve as anticipated.    Einar Pheasant, MD

## 2021-08-09 NOTE — Telephone Encounter (Signed)
Patient calling back in

## 2021-08-09 NOTE — Telephone Encounter (Signed)
Patient scheduled with Dr Nicki Reaper to discuss.

## 2021-08-11 ENCOUNTER — Ambulatory Visit: Payer: 59 | Admitting: Pharmacist

## 2021-08-11 DIAGNOSIS — Z6832 Body mass index (BMI) 32.0-32.9, adult: Secondary | ICD-10-CM

## 2021-08-11 DIAGNOSIS — E7800 Pure hypercholesterolemia, unspecified: Secondary | ICD-10-CM

## 2021-08-11 MED ORDER — OZEMPIC (1 MG/DOSE) 4 MG/3ML ~~LOC~~ SOPN: 1.0000 mg | PEN_INJECTOR | SUBCUTANEOUS | 2 refills | Status: DC

## 2021-08-11 NOTE — Patient Instructions (Signed)
Visit Information   Goals Addressed               This Visit's Progress     Patient Stated     Medication Monitoring (pt-stated)        Patient Goals/Self-Care Activities Over the next 90 days, patient will:  - target a minimum of 150 minutes of moderate intensity exercise weekly engage in dietary modifications by reducing portion sizes, cutting back on high sugars, and increasing fiber intake.         Patient verbalizes understanding of instructions provided today and agrees to view in [place].   Plan: Telephone follow up appointment with care management team member scheduled for:  6 weeks  Catie Darnelle Maffucci, PharmD, [place], [place] Clinical Pharmacist [employer] at [employer] [phone]

## 2021-08-11 NOTE — Chronic Care Management (AMB) (Signed)
Care Management   Pharmacy Note  08/11/2021 Name: Alison Rodriguez MRN: [id number] DOB: 01-Dec-1960  Subjective: Allisyn Rodriguez is a 60 y.o. year old female who is a primary care patient of Einar Pheasant, MD. The Care Management team was consulted for assistance with care management and care coordination needs.    Engaged with patient by telephone for follow up visit in response to provider referral for pharmacy case management and/or care coordination services.   The patient was given information about Care Management services today including:  Care Management services includes personalized support from designated clinical staff supervised by the patient's primary care provider, including individualized plan of care and coordination with other care providers. 24/7 contact phone numbers for assistance for urgent and routine care needs. The patient may stop case management services at any time by phone call to the office staff.  Patient agreed to services and consent obtained.  Assessment:  Review of patient status, including review of consultants reports, laboratory and other test data, was performed as part of comprehensive evaluation and provision of chronic care management services.   SDOH (Social Determinants of Health) assessments and interventions performed:  SDOH Interventions    Flowsheet Row Most Recent Value  SDOH Interventions   Financial Strain Interventions Intervention Not Indicated        Objective:  Lab Results  Component Value Date   CREATININE 0.66 07/08/2021   CREATININE 0.56 04/05/2021   CREATININE 0.65 10/07/2020    No results found for: HGBA1C     Component Value Date/Time   CHOL 149 07/08/2021 0900   TRIG 63.0 07/08/2021 0900   HDL 49.00 07/08/2021 0900   CHOLHDL 3 07/08/2021 0900   VLDL 12.6 07/08/2021 0900   LDLCALC 87 07/08/2021 0900     BP Readings from Last 3 Encounters:  07/08/21 122/78  04/05/21 124/78  02/04/21 114/76    Care  Plan  No Known Allergies  Medications Reviewed Today     Reviewed by De Hollingshead, RPH-CPP (Pharmacist) on 08/11/21 at [phone]  Med List Status: <None>   Medication Order Taking? Sig Documenting Provider Last Dose Status Informant  hydrocortisone 2.5 % cream [id number] Yes Apply to bumps on chest 1-2 times a day as needed for itch. Brendolyn Patty, MD Taking Active   Insulin Pen Needle (PEN NEEDLES) 32G X 4 MM MISC [id number] Yes Used to give Ozempic injections. Einar Pheasant, MD Taking Active   Multiple Vitamins-Minerals (MULTIVITAMIN ADULT, MINERALS,) TABS [id number] No Take 1 tablet by mouth daily.  Patient not taking: Reported on 08/11/2021   [provider] Not Taking Active Self  OZEMPIC, 0.25 OR 0.5 MG/DOSE, 2 MG/1.5ML SOPN [id number] Yes Inject 0.5 mg into the skin once a week. [provider] Taking Active   rosuvastatin (CRESTOR) 10 MG tablet [id number] Yes TAKE 1 TABLET BY MOUTH EVERY DAY Einar Pheasant, MD Taking Active   tretinoin (RETIN-A) 0.05 % cream [id number] Yes Apply a pea-sized amount to face every night as tolerated for acne. Brendolyn Patty, MD Taking Active             Patient Active Problem List   Diagnosis Date Noted   Special screening for malignant neoplasms, colon    Polyp of descending colon    BMI 34.0-34.9,adult 01/02/2021   Cough 10/10/2020   Pain of right heel 03/30/2020   Right arm pain 03/30/2020   Colon cancer screening 03/30/2020   Postmenopausal bleeding 09/17/2019   Rash 09/17/2019   Vaginitis 05/12/2019   Herpes zoster  05/10/2018   Urinary frequency 05/10/2018   Trichomonas infection 05/10/2018   Abnormal chest CT 02/07/2016   Health care maintenance 01/25/2015   Dysuria 07/19/2014   Hemorrhoids 11/30/2013   Abnormal Pap smear of cervix 11/30/2013   Chest tightness 11/30/2013   Headache 11/26/2013   Hypercholesterolemia 11/26/2013   Blood pressure elevated without history of HTN 11/26/2013    Conditions to be  addressed/monitored: HLD and Obesity  Care Plan : Disease Progression Prevention  Updates made by De Hollingshead, RPH-CPP since 08/11/2021 12:00 AM     Problem: Obesity, HLD, GERD      Long-Range Goal: Disease Progression Prevention   This Visit's Progress: On track  Recent Progress: On track  Priority: High  Note:   Current Barriers:  Unable to achieve control of obesity   Pharmacist Clinical Goal(s):  Over the next 90 days, patient will achieve control of obesity as evidenced by weight through collaboration with PharmD and provider.   Interventions: 1:1 collaboration with Einar Pheasant, MD regarding development and update of comprehensive plan of care as evidenced by provider attestation and co-signature Inter-disciplinary care team collaboration (see longitudinal plan of care) Comprehensive medication review performed; medication list updated in electronic medical record  Acute Needs: Reports feeling better today, recovering from [place]  Obesity with history of impaired fasting glucose: Unable to independently achieve weight loss with diet and exercise alone. Current regimen: Ozempic 0.5 mg weekly Prior medications for weight loss tried: none  Baseline weight: 170 lbs; most recent weight at home: 160 lbs, weight loss thus far 10 lbs (~ 5% from baseline) Goal weight in the next 6 months: 130 lbs Current dietary patterns: "intermittent fasting;" two meals daily usually, eating 11:30-12 pm and ~ 6-7 pm. Reports she had been on vacation before having COVID, so has not had regular meal patterns lately. Breakfast: sandwiches; Supper: lean proteins, vegetables, small carbohydrate servings. Drinks: water, half and half tea, coffee; Snacks: only if there is something around Current exercise patterns: reports that she likes doing yoga. Plans to incorporate more of that.  Encouraged continued focus on dietary and lifestyle interventions.  Increase Ozempic to 1 mg weekly. Patient can  inject 2 of the 0.5 mg doses to complete current supply.   Hyperlipidemia: Controlled at goal LDL <100; current treatment: rosuvastatin 10 mg daily; Previously recommended to continue current regimen at this time.  Patient Goals/Self-Care Activities Over the next 90 days, patient will:  - target a minimum of 150 minutes of moderate intensity exercise weekly engage in dietary modifications by reducing portion sizes, cutting back on high sugars, and increasing fiber intake.   Follow Up Plan: Telephone appointment scheduled in :~ 6 weeks      Medication Assistance:  None required.  Patient affirms current coverage meets needs.  Follow Up:  Patient agrees to Care Plan and Follow-up.  Plan: Telephone follow up appointment with care management team member scheduled for:  6 weeks  Catie Darnelle Maffucci, PharmD, [place], [place] Clinical Pharmacist [employer] at [employer] [phone]

## 2021-08-14 ENCOUNTER — Encounter: Payer: Self-pay | Admitting: Internal Medicine

## 2021-08-14 DIAGNOSIS — U071 COVID-19: Secondary | ICD-10-CM | POA: Insufficient documentation

## 2021-08-14 NOTE — Assessment & Plan Note (Signed)
Tested positive 08/06/21.  Symptoms started 08/04/21.  Feeling better. Discussed saline nasal spray and steroid nasal spray if needed for nasal congestion and drainage.  Aching and cough better.  Can use delsym/robitussin DM if needed.  No chest pain or sob.  Rest.  Fluids.  Discussed quarantine guidelines.  Discussed oral antiviral medication.  She reports since feeling better, wants to hold on prescription medication at this time.  Follow.  Call with update.

## 2021-08-15 ENCOUNTER — Ambulatory Visit: Payer: 59 | Admitting: Dermatology

## 2021-08-15 DIAGNOSIS — U071 COVID-19: Secondary | ICD-10-CM | POA: Diagnosis not present

## 2021-08-23 ENCOUNTER — Other Ambulatory Visit: Payer: Self-pay | Admitting: Dermatology

## 2021-08-23 DIAGNOSIS — L700 Acne vulgaris: Secondary | ICD-10-CM

## 2021-08-26 ENCOUNTER — Encounter: Payer: Self-pay | Admitting: Internal Medicine

## 2021-08-26 ENCOUNTER — Telehealth: Payer: 59 | Admitting: Physician Assistant

## 2021-08-26 DIAGNOSIS — K219 Gastro-esophageal reflux disease without esophagitis: Secondary | ICD-10-CM | POA: Diagnosis not present

## 2021-08-26 MED ORDER — FAMOTIDINE 40 MG PO TABS: 40.0000 mg | ORAL_TABLET | Freq: Every day | ORAL | 0 refills | Status: DC

## 2021-08-26 NOTE — Patient Instructions (Signed)
Alison Rodriguez, thank you for joining Leeanne Rio, PA-C for today's virtual visit.  While this provider is not your primary care provider (PCP), if your PCP is located in our provider database this encounter information will be shared with them immediately following your visit.  Consent: (Patient) Alison Rodriguez provided verbal consent for this virtual visit at the beginning of the encounter.  Current Medications:  Current Outpatient Medications:    hydrocortisone 2.5 % cream, Apply to bumps on chest 1-2 times a day as needed for itch., Disp: 30 g, Rfl: 1   Insulin Pen Needle (PEN NEEDLES) 32G X 4 MM MISC, Used to give Ozempic injections., Disp: 50 each, Rfl: 0   rosuvastatin (CRESTOR) 10 MG tablet, TAKE 1 TABLET BY MOUTH EVERY DAY, Disp: 30 tablet, Rfl: 11   Semaglutide, 1 MG/DOSE, (OZEMPIC, 1 MG/DOSE,) 4 MG/3ML SOPN, Inject 1 mg into the skin once a week., Disp: 3 mL, Rfl: 2   tretinoin (RETIN-A) 0.05 % cream, APPLY A PEA-SIZED AMOUNT TO FACE EVERY NIGHT AS TOLERATED FOR ACNE., Disp: 20 g, Rfl: 0   Medications ordered in this encounter:  No orders of the defined types were placed in this encounter.    *If you need refills on other medications prior to your next appointment, please contact your pharmacy*  Follow-Up: Call back or seek an in-person evaluation if the symptoms worsen or if the condition fails to improve as anticipated.  Other Instructions Please take the famotidine each evening as directed for up to the next 2 weeks. Increase fluids.  Follow dietary recommendations below.  You can continue Mucinex when needed. If symptoms are not resolving any new symptoms develop or anything worsens, you need an in-person evaluation   Food Choices for Gastroesophageal Reflux Disease, Adult When you have gastroesophageal reflux disease (GERD), the foods you eat and your eating habits are very important. Choosing the right foods can help ease your discomfort. Think about  working with a food expert (dietitian) to help you make good choices. What are tips for following this plan? Reading food labels Look for foods that are low in saturated fat. Foods that may help with your symptoms include: Foods that have less than 5% of daily value (DV) of fat. Foods that have 0 grams of trans fat. Cooking Do not fry your food. Cook your food by baking, steaming, grilling, or broiling. These are all methods that do not need a lot of fat for cooking. To add flavor, try to use herbs that are low in spice and acidity. Meal planning  Choose healthy foods that are low in fat, such as: Fruits and vegetables. Whole grains. Low-fat dairy products. Lean meats, fish, and poultry. Eat small meals often instead of eating 3 large meals each day. Eat your meals slowly in a place where you are relaxed. Avoid bending over or lying down until 2-3 hours after eating. Limit high-fat foods such as fatty meats or fried foods. Limit your intake of fatty foods, such as oils, butter, and shortening. Avoid the following as told by your doctor: Foods that cause symptoms. These may be different for different people. Keep a food diary to keep track of foods that cause symptoms. Alcohol. Drinking a lot of liquid with meals. Eating meals during the 2-3 hours before bed. Lifestyle Stay at a healthy weight. Ask your doctor what weight is healthy for you. If you need to lose weight, work with your doctor to do so safely. Exercise for at least 30 minutes  on 5 or more days each week, or as told by your doctor. Wear loose-fitting clothes. Do not smoke or use any products that contain nicotine or tobacco. If you need help quitting, ask your doctor. Sleep with the head of your bed higher than your feet. Use a wedge under the mattress or blocks under the bed frame to raise the head of the bed. Chew sugar-free gum after meals. What foods should eat? Eat a healthy, well-balanced diet of fruits, vegetables,  whole grains, low-fat dairy products, lean meats, fish, and poultry. Each person is different. Foods that may cause symptoms in one person may not cause any symptoms in another person. Work with your doctor to find foods that are safe for you. The items listed above may not be a complete list of what you can eat and drink. Contact a food expert for more options. What foods should I avoid? Limiting some of these foods may help in managing the symptoms of GERD. Everyone is different. Talk with a food expert or your doctor to help you find the exact foods to avoid, if any. Fruits Any fruits prepared with added fat. Any fruits that cause symptoms. For some people, this may include citrus fruits, such as oranges, grapefruit, pineapple, and lemons. Vegetables Deep-fried vegetables. [place] fries. Any vegetables prepared with added fat. Any vegetables that cause symptoms. For some people, this may include tomatoes and tomato products, chili peppers, onions and garlic, and horseradish. Grains Pastries or quick breads with added fat. Meats and other proteins High-fat meats, such as fatty beef or pork, hot dogs, ribs, ham, sausage, salami, and bacon. Fried meat or protein, including fried fish and fried chicken. Nuts and nut butters, in large amounts. Dairy Whole milk and chocolate milk. Sour cream. Cream. Ice cream. Cream cheese. Milkshakes. Fats and oils Butter. Margarine. Shortening. Ghee. Beverages Coffee and tea, with or without caffeine. Carbonated beverages. Sodas. Energy drinks. Fruit juice made with acidic fruits, such as orange or grapefruit. Tomato juice. Alcoholic drinks. Sweets and desserts Chocolate and cocoa. Donuts. Seasonings and condiments Pepper. Peppermint and spearmint. Added salt. Any condiments, herbs, or seasonings that cause symptoms. For some people, this may include curry, hot sauce, or vinegar-based salad dressings. The items listed above may not be a complete list of what you  should not eat and drink. Contact a food expert for more options. Questions to ask your doctor Diet and lifestyle changes are often the first steps that are taken to manage symptoms of GERD. If diet and lifestyle changes do not help, talk with your doctor about taking medicines. Where to find more information International Foundation for Gastrointestinal Disorders: aboutgerd.org Summary When you have GERD, food and lifestyle choices are very important in easing your symptoms. Eat small meals often instead of 3 large meals a day. Eat your meals slowly and in a place where you are relaxed. Avoid bending over or lying down until 2-3 hours after eating. Limit high-fat foods such as fatty meats or fried foods. This information is not intended to replace advice given to you by your health care provider. Make sure you discuss any questions you have with your health care provider. Document Revised: 05/17/2020 Document Reviewed: 05/17/2020 Elsevier Patient Education  2022 [employer].    If you have been instructed to have an in-person evaluation today at a local Urgent Care facility, please use the link below. It will take you to a list of all of our available [facility], including address,  phone number and hours of operation. Please do not delay care.  [facility]  If you or a family member do not have a primary care provider, use the link below to schedule a visit and establish care. When you choose a [facility] primary care physician or advanced practice provider, you gain a long-term partner in health. Find a Primary Care Provider  Learn more about [facility] in-office and virtual care options: [facility]

## 2021-08-26 NOTE — Progress Notes (Signed)
Virtual Visit Consent   Alison Rodriguez, you are scheduled for a virtual visit with a [facility] provider today.     Just as with appointments in the office, your consent must be obtained to participate.  Your consent will be active for this visit and any virtual visit you may have with one of our providers in the next 365 days.     If you have a MyChart account, a copy of this consent can be sent to you electronically.  All virtual visits are billed to your insurance company just like a traditional visit in the office.    As this is a virtual visit, video technology does not allow for your provider to perform a traditional examination.  This may limit your provider's ability to fully assess your condition.  If your provider identifies any concerns that need to be evaluated in person or the need to arrange testing (such as labs, EKG, etc.), we will make arrangements to do so.     Although advances in technology are sophisticated, we cannot ensure that it will always work on either your end or our end.  If the connection with a video visit is poor, the visit may have to be switched to a telephone visit.  With either a video or telephone visit, we are not always able to ensure that we have a secure connection.     I need to obtain your verbal consent now.   Are you willing to proceed with your visit today?    Henritta Mozer has provided verbal consent on 08/26/2021 for a virtual visit (video or telephone).   Leeanne Rio, [place]   Date: 08/26/2021 4:50 PM   Virtual Visit via Video Note   I, Leeanne Rio, connected with  Miles Borkowski  ([id number], 05-01-1961) on 08/26/21 at  4:30 PM EDT by a video-enabled telemedicine application and verified that I am speaking with the correct person using two identifiers.  Location: Patient: Virtual Visit Location Patient: Home Provider: Virtual Visit Location Provider: Home Office   I discussed the limitations of evaluation and management  by telemedicine and the availability of in person appointments. The patient expressed understanding and agreed to proceed.    History of Present Illness: Alison Rodriguez is a 60 y.o. who identifies as a female who was assigned female at birth, and is being seen today for some lingering symptoms after recent COVID. Has noted still with sensation of reflux and phlegm in the throat even though coughing has resolved. A sensation of "tightness" in the throat sometimes with swallowing as well. Denies inability to swallow or food getting stuck. Denies abdominal pain, chest pain. Otherwise feeling well from [place]. Has continued her allergy medications to make sure she does not get any PND from allergens.   HPI: HPI  Problems:  Patient Active Problem List   Diagnosis Date Noted   COVID-19 virus infection 08/14/2021   Special screening for malignant neoplasms, colon    Polyp of descending colon    BMI 34.0-34.9,adult 01/02/2021   Cough 10/10/2020   Pain of right heel 03/30/2020   Right arm pain 03/30/2020   Colon cancer screening 03/30/2020   Postmenopausal bleeding 09/17/2019   Rash 09/17/2019   Vaginitis 05/12/2019   Herpes zoster 05/10/2018   Urinary frequency 05/10/2018   Trichomonas infection 05/10/2018   Abnormal chest CT 02/07/2016   Health care maintenance 01/25/2015   Dysuria 07/19/2014   Hemorrhoids 11/30/2013   Abnormal Pap smear of cervix 11/30/2013  Chest tightness 11/30/2013   Headache 11/26/2013   Hypercholesterolemia 11/26/2013   Blood pressure elevated without history of HTN 11/26/2013    Allergies: No Known Allergies Medications:  Current Outpatient Medications:    famotidine (PEPCID) 40 MG tablet, Take 1 tablet (40 mg total) by mouth daily., Disp: 30 tablet, Rfl: 0   hydrocortisone 2.5 % cream, Apply to bumps on chest 1-2 times a day as needed for itch., Disp: 30 g, Rfl: 1   Insulin Pen Needle (PEN NEEDLES) 32G X 4 MM MISC, Used to give Ozempic injections., Disp: 50  each, Rfl: 0   rosuvastatin (CRESTOR) 10 MG tablet, TAKE 1 TABLET BY MOUTH EVERY DAY, Disp: 30 tablet, Rfl: 11   Semaglutide, 1 MG/DOSE, (OZEMPIC, 1 MG/DOSE,) 4 MG/3ML SOPN, Inject 1 mg into the skin once a week., Disp: 3 mL, Rfl: 2   tretinoin (RETIN-A) 0.05 % cream, APPLY A PEA-SIZED AMOUNT TO FACE EVERY NIGHT AS TOLERATED FOR ACNE., Disp: 20 g, Rfl: 0  Observations/Objective: Patient is well-developed, well-nourished in no acute distress.  Resting comfortably at home.  Head is normocephalic, atraumatic.  No labored breathing. Speech is clear and coherent with logical content.  Patient is alert and oriented at baseline.   Assessment and Plan: 1. Laryngopharyngeal reflux - famotidine (PEPCID) 40 MG tablet; Take 1 tablet (40 mg total) by mouth daily.  Dispense: 30 tablet; Refill: 0 Sounds like mild inflammation in esophagus with globus sensation from silent reflux triggered by recent COVID and associated PND, coughing, etc. Will have her keep hydrated. Start GERD diet. Rx Famotidine 40 mg nightly for 2-week trial. Can continue her Mucinex and Claritin as needed. Follow-up precautions discussed.  Follow Up Instructions: I discussed the assessment and treatment plan with the patient. The patient was provided an opportunity to ask questions and all were answered. The patient agreed with the plan and demonstrated an understanding of the instructions.  A copy of instructions were sent to the patient via MyChart unless otherwise noted below.   The patient was advised to call back or seek an in-person evaluation if the symptoms worsen or if the condition fails to improve as anticipated.  Time:  I spent 12 minutes with the patient via telehealth technology discussing the above problems/concerns.    Leeanne Rio, PA-C

## 2021-08-29 ENCOUNTER — Other Ambulatory Visit: Payer: Self-pay

## 2021-08-29 ENCOUNTER — Ambulatory Visit
Admission: RE | Admit: 2021-08-29 | Discharge: 2021-08-29 | Disposition: A | Discharge status: Home / Self Care | Payer: 59 | Source: Ambulatory Visit | Attending: Internal Medicine | Admitting: Internal Medicine

## 2021-08-29 DIAGNOSIS — Z1231 Encounter for screening mammogram for malignant neoplasm of breast: Secondary | ICD-10-CM

## 2021-09-14 ENCOUNTER — Encounter: Payer: Self-pay | Admitting: Internal Medicine

## 2021-09-14 DIAGNOSIS — Z6832 Body mass index (BMI) 32.0-32.9, adult: Secondary | ICD-10-CM

## 2021-09-15 MED ORDER — OZEMPIC (1 MG/DOSE) 4 MG/3ML ~~LOC~~ SOPN: 1.0000 mg | PEN_INJECTOR | SUBCUTANEOUS | 2 refills | Status: DC

## 2021-09-20 ENCOUNTER — Encounter: Payer: Self-pay | Admitting: Internal Medicine

## 2021-09-20 DIAGNOSIS — K219 Gastro-esophageal reflux disease without esophagitis: Secondary | ICD-10-CM

## 2021-09-20 MED ORDER — FAMOTIDINE 40 MG PO TABS: 40.0000 mg | ORAL_TABLET | Freq: Every day | ORAL | 1 refills | Status: DC

## 2021-09-29 ENCOUNTER — Ambulatory Visit: Payer: 59 | Admitting: Pharmacist

## 2021-09-29 DIAGNOSIS — Z6832 Body mass index (BMI) 32.0-32.9, adult: Secondary | ICD-10-CM

## 2021-09-29 DIAGNOSIS — R7301 Impaired fasting glucose: Secondary | ICD-10-CM

## 2021-09-29 DIAGNOSIS — R21 Rash and other nonspecific skin eruption: Secondary | ICD-10-CM

## 2021-09-29 NOTE — Chronic Care Management (AMB) (Signed)
Chronic Care Management CCM Pharmacy Note  09/29/2021 Name:  Alison Rodriguez MRN:  [id number] DOB:  September 24, 1961  Summary: - Reported rash related to [place].   Recommendations/Changes made from today's visit: - Discontinue Ozempic. F/u with PCP in ~ 6 weeks as scheduled  Subjective: Alison Rodriguez is an 60 y.o. year old female who is a primary patient of Einar Pheasant, MD.  The CCM team was consulted for assistance with disease management and care coordination needs.    Engaged with patient by telephone for  medication management  for pharmacy case management and/or care coordination services.   Objective:  Medications Reviewed Today     Reviewed by Delorse Limber (Physician Assistant Certified) on 25/95/63 at 1638  Med List Status: <None>   Medication Order Taking? Sig Documenting Provider Last Dose Status Informant  hydrocortisone 2.5 % cream [id number] No Apply to bumps on chest 1-2 times a day as needed for itch. Brendolyn Patty, MD Taking Active   Insulin Pen Needle (PEN NEEDLES) 32G X 4 MM MISC [id number] No Used to give Ozempic injections. Einar Pheasant, MD Taking Active   rosuvastatin (CRESTOR) 10 MG tablet [id number] No TAKE 1 TABLET BY MOUTH EVERY DAY Einar Pheasant, MD Taking Active   Semaglutide, 1 MG/DOSE, (OZEMPIC, 1 MG/DOSE,) 4 MG/3ML SOPN [id number]  Inject 1 mg into the skin once a week. Einar Pheasant, MD  Active   tretinoin (RETIN-A) 0.05 % cream [id number]  APPLY A PEA-SIZED AMOUNT TO FACE EVERY NIGHT AS TOLERATED FOR ACNE. Brendolyn Patty, MD  Active             Pertinent Labs:   No results found for: HGBA1C Lab Results  Component Value Date   CHOL 149 07/08/2021   HDL 49.00 07/08/2021   LDLCALC 87 07/08/2021   TRIG 63.0 07/08/2021   CHOLHDL 3 07/08/2021   Lab Results  Component Value Date   CREATININE 0.66 07/08/2021   BUN 9 07/08/2021   NA 137 07/08/2021   K 3.9 07/08/2021   CL 104 07/08/2021   CO2 26 07/08/2021    SDOH:  (Social  Determinants of Health) assessments and interventions performed:  SDOH Interventions    Flowsheet Row Most Recent Value  SDOH Interventions   Financial Strain Interventions Intervention Not Indicated       CCM Care Plan  Review of patient past medical history, allergies, medications, health status, including review of consultants reports, laboratory and other test data, was performed as part of comprehensive evaluation and provision of chronic care management services.   Care Plan : Disease Progression Prevention  Updates made by De Hollingshead, RPH-CPP since 09/29/2021 12:00 AM     Problem: Obesity, HLD, GERD      Long-Range Goal: Disease Progression Prevention   Recent Progress: On track  Priority: High  Note:   Current Barriers:  Unable to achieve control of obesity   Pharmacist Clinical Goal(s):  Over the next 90 days, patient will achieve control of obesity as evidenced by weight through collaboration with PharmD and provider.   Interventions: 1:1 collaboration with Einar Pheasant, MD regarding development and update of comprehensive plan of care as evidenced by provider attestation and co-signature Inter-disciplinary care team collaboration (see longitudinal plan of care) Comprehensive medication review performed; medication list updated in electronic medical record   Obesity with history of impaired fasting glucose: Unable to independently achieve weight loss with diet and exercise alone. Current regimen: Ozempic 1 mg weekly Prior medications for weight loss tried:  none  Baseline weight: 170 lbs; most recent weight at home: 160 lbs, weight loss thus far 10 lbs (~ 5% from baseline) Goal weight in the next 6 months: 130 lbs Noted in MyChart message that patient requested refill on famotidine due to "itching and rash" from [place]. Discussed symptoms. She denies throat/breathing involvement, but endorses red, raised, itchy rashes on chest after Ozempic injection.  Reports that she did hold Ozempic for ~ 2 weeks after prior appointment with PCP with improvement in reaction that returned once she restarted Ozempic. Discussed today that it could be dangerous to continue medication that causes a potential allergic reaction. Recommend discontinuation of Ozempic. Patient agreeable. Follow up with PCP in December to discuss trial of alternative GLP1.  Hyperlipidemia: Controlled at goal LDL <100; current treatment: rosuvastatin 10 mg daily; Previously recommended to continue current regimen at this time.  Patient Goals/Self-Care Activities Over the next 90 days, patient will:  - target a minimum of 150 minutes of moderate intensity exercise weekly engage in dietary modifications by reducing portion sizes, cutting back on high sugars, and increasing fiber intake.       Plan: F/u with PCP in ~ 6 weeks. Canceling pharmacy follow up tomorrow  [place], PharmD, [place], [place] Clinical Pharmacist [employer] at [facility] [phone]

## 2021-09-29 NOTE — Patient Instructions (Signed)
Visit Information    Plan: F/u with PCP in ~ 6 weeks. Canceling pharmacy follow up tomorrow  [place], PharmD, [place], [place] Clinical Pharmacist [employer] at [facility] [phone]

## 2021-09-30 ENCOUNTER — Telehealth: Payer: 59

## 2021-11-08 ENCOUNTER — Other Ambulatory Visit: Payer: Self-pay

## 2021-11-08 ENCOUNTER — Ambulatory Visit (INDEPENDENT_AMBULATORY_CARE_PROVIDER_SITE_OTHER): Payer: 59 | Admitting: Internal Medicine

## 2021-11-08 VITALS — BP 120/72 | HR 78 | Temp 97.6°F | Resp 16 | Ht 59.0 in | Wt 159.6 lb

## 2021-11-08 DIAGNOSIS — E7800 Pure hypercholesterolemia, unspecified: Secondary | ICD-10-CM | POA: Diagnosis not present

## 2021-11-08 DIAGNOSIS — M25562 Pain in left knee: Secondary | ICD-10-CM

## 2021-11-08 DIAGNOSIS — R21 Rash and other nonspecific skin eruption: Secondary | ICD-10-CM

## 2021-11-08 DIAGNOSIS — Z0000 Encounter for general adult medical examination without abnormal findings: Secondary | ICD-10-CM | POA: Diagnosis not present

## 2021-11-08 LAB — CBC WITH DIFFERENTIAL/PLATELET
Basophils Absolute: 0 10*3/uL (ref 0.0–0.1)
Basophils Relative: 0.2 % (ref 0.0–3.0)
Eosinophils Absolute: 0.2 10*3/uL (ref 0.0–0.7)
Eosinophils Relative: 4.6 % (ref 0.0–5.0)
HCT: 44.2 % (ref 36.0–46.0)
Hemoglobin: 14 g/dL (ref 12.0–15.0)
Lymphocytes Relative: 32.1 % (ref 12.0–46.0)
Lymphs Abs: 1.7 10*3/uL (ref 0.7–4.0)
MCHC: 31.7 g/dL (ref 30.0–36.0)
MCV: 88.7 fl (ref 78.0–100.0)
Monocytes Absolute: 0.4 10*3/uL (ref 0.1–1.0)
Monocytes Relative: 7.4 % (ref 3.0–12.0)
Neutro Abs: 3 10*3/uL (ref 1.4–7.7)
Neutrophils Relative %: 55.7 % (ref 43.0–77.0)
Platelets: 296 10*3/uL (ref 150.0–400.0)
RBC: 4.98 Mil/uL (ref 3.87–5.11)
RDW: 14.5 % (ref 11.5–15.5)
WBC: 5.3 10*3/uL (ref 4.0–10.5)

## 2021-11-08 LAB — TSH: TSH: 1.42 u[IU]/mL (ref 0.35–5.50)

## 2021-11-08 LAB — LIPID PANEL
Cholesterol: 173 mg/dL (ref 0–200)
HDL: 53.3 mg/dL (ref >39.00)
LDL Cholesterol: 101 mg/dL — ABNORMAL HIGH (ref 0–99)
NonHDL: 120.15
Total CHOL/HDL Ratio: 3
Triglycerides: 96 mg/dL (ref 0.0–149.0)
VLDL: 19.2 mg/dL (ref 0.0–40.0)

## 2021-11-08 LAB — BASIC METABOLIC PANEL
BUN: 10 mg/dL (ref 6–23)
CO2: 28 mEq/L (ref 19–32)
Calcium: 9.3 mg/dL (ref 8.4–10.5)
Chloride: 104 mEq/L (ref 96–112)
Creatinine, Ser: 0.55 mg/dL (ref 0.40–1.20)
GFR: 101.32 mL/min (ref >60.00)
Glucose, Bld: 85 mg/dL (ref 70–99)
Potassium: 4.2 mEq/L (ref 3.5–5.1)
Sodium: 138 mEq/L (ref 135–145)

## 2021-11-08 LAB — HEPATIC FUNCTION PANEL
ALT: 21 U/L (ref 0–35)
AST: 19 U/L (ref 0–37)
Albumin: 4 g/dL (ref 3.5–5.2)
Alkaline Phosphatase: 59 U/L (ref 39–117)
Bilirubin, Direct: 0.1 mg/dL (ref 0.0–0.3)
Total Bilirubin: 0.4 mg/dL (ref 0.2–1.2)
Total Protein: 7 g/dL (ref 6.0–8.3)

## 2021-11-08 NOTE — Progress Notes (Signed)
Patient ID: Alison Rodriguez, female   DOB: 1961-10-20, 60 y.o.   MRN: [id number]   Subjective:    Patient ID: Alison Rodriguez, female    DOB: September 23, 1961, 60 y.o.   MRN: [id number]  This visit occurred during the SARS-CoV-2 public health emergency.  Safety protocols were in place, including screening questions prior to the visit, additional usage of staff PPE, and extensive cleaning of exam room while observing appropriate contact time as indicated for disinfecting solutions.   Patient here for her physical exam.   Chief Complaint  Patient presents with   Annual Exam   .   HPI Had rash with ozempic.  Off now.  Discussed diet and exercise.  She plans to get back in routine with both.  No chest pain or sob reported.  No abdominal pain or bowel change reported.  Injured her left knee - pressure/bumped.  No fall.  Occurred last week.  Some better.  No increased swelling of leg.  Noticed rash after eating at [facility].  Unclear cause.     Past Medical History:  Diagnosis Date   Blood in stool    H/O   Hyperlipidemia    Hypertension    Thyroid disease    Past Surgical History:  Procedure Laterality Date   COLONOSCOPY WITH PROPOFOL N/A 02/04/2021   Procedure: COLONOSCOPY WITH PROPOFOL;  Surgeon: Lucilla Lame, MD;  Location: [facility];  Service: Endoscopy;  Laterality: N/A;  priority 4   POLYPECTOMY  02/04/2021   Procedure: POLYPECTOMY;  Surgeon: Lucilla Lame, MD;  Location: [facility];  Service: Endoscopy;;   UTERINE FIBROID EMBOLIZATION     Family History  Problem Relation Age of Onset   Diabetes Mother    Diabetes Maternal Grandmother    Arthritis Maternal Grandfather    Diabetes Maternal Grandfather    Breast cancer Other        second cousins/aunt   Social History   Socioeconomic History   Marital status: Married    Spouse name: Not on file   Number of children: Not on file   Years of education: Not on file   Highest education level: Not on file   Occupational History   Not on file  Tobacco Use   Smoking status: Passive Smoke Exposure - Never Smoker   Smokeless tobacco: Never  Substance and Sexual Activity   Alcohol use: No    Alcohol/week: 0.0 standard drinks   Drug use: No   Sexual activity: Not on file  Other Topics Concern   Not on file  Social History Narrative   Not on file   Social Determinants of Health   Financial Resource Strain: Low Risk    Difficulty of Paying Living Expenses: Not hard at all  Food Insecurity: Not on file  Transportation Needs: Not on file  Physical Activity: Not on file  Stress: Not on file  Social Connections: Not on file     Review of Systems  Constitutional:  Negative for appetite change and unexpected weight change.  HENT:  Negative for congestion, sinus pressure and sore throat.   Eyes:  Negative for pain and visual disturbance.  Respiratory:  Negative for cough, chest tightness and shortness of breath.   Cardiovascular:  Negative for chest pain, palpitations and leg swelling.  Gastrointestinal:  Negative for abdominal pain, diarrhea, nausea and vomiting.  Genitourinary:  Negative for difficulty urinating and dysuria.  Musculoskeletal:  Negative for myalgias.       Knee pain as outlined.  Skin:  Negative for color change and rash.  Neurological:  Negative for dizziness, light-headedness and headaches.  Hematological:  Negative for adenopathy. Does not bruise/bleed easily.  Psychiatric/Behavioral:  Negative for agitation and dysphoric mood.       Objective:     BP 120/72    Pulse 78    Temp 97.6 F (36.4 C)    Resp 16    Ht 4\' 11"  (1.499 m)    Wt 159 lb 9.6 oz (72.4 kg)    LMP 06/22/2015 (Approximate)    SpO2 98%    BMI 32.24 kg/m  Wt Readings from Last 3 Encounters:  11/08/21 159 lb 9.6 oz (72.4 kg)  08/09/21 161 lb (73 kg)  07/08/21 161 lb (73 kg)    Physical Exam Vitals reviewed.  Constitutional:      General: She is not in acute distress.    Appearance: Normal  appearance. She is well-developed.  HENT:     Head: Normocephalic and atraumatic.     Right Ear: External ear normal.     Left Ear: External ear normal.  Eyes:     General: No scleral icterus.       Right eye: No discharge.        Left eye: No discharge.     Conjunctiva/sclera: Conjunctivae normal.  Neck:     Thyroid: No thyromegaly.  Cardiovascular:     Rate and Rhythm: Normal rate and regular rhythm.  Pulmonary:     Effort: No tachypnea, accessory muscle usage or respiratory distress.     Breath sounds: Normal breath sounds. No decreased breath sounds or wheezing.  Chest:  Breasts:    Right: No inverted nipple, mass, nipple discharge or tenderness (no axillary adenopathy).     Left: No inverted nipple, mass, nipple discharge or tenderness (no axilarry adenopathy).  Abdominal:     General: Bowel sounds are normal.     Palpations: Abdomen is soft.     Tenderness: There is no abdominal tenderness.  Musculoskeletal:        General: No swelling or tenderness.     Cervical back: Neck supple. No tenderness.     Comments: Good rom  - knee.    Lymphadenopathy:     Cervical: No cervical adenopathy.  Skin:    Findings: No erythema or rash.  Neurological:     Mental Status: She is alert and oriented to person, place, and time.  Psychiatric:        Mood and Affect: Mood normal.        Behavior: Behavior normal.     Outpatient Encounter Medications as of 11/08/2021  Medication Sig   famotidine (PEPCID) 40 MG tablet Take 1 tablet (40 mg total) by mouth daily.   hydrocortisone 2.5 % cream Apply to bumps on chest 1-2 times a day as needed for itch.   rosuvastatin (CRESTOR) 10 MG tablet TAKE 1 TABLET BY MOUTH EVERY DAY   [DISCONTINUED] Insulin Pen Needle (PEN NEEDLES) 32G X 4 MM MISC Used to give Ozempic injections.   [DISCONTINUED] tretinoin (RETIN-A) 0.05 % cream APPLY A PEA-SIZED AMOUNT TO FACE EVERY NIGHT AS TOLERATED FOR ACNE.   No facility-administered encounter medications on  file as of 11/08/2021.     Lab Results  Component Value Date   WBC 5.3 11/08/2021   HGB 14.0 11/08/2021   HCT 44.2 11/08/2021   PLT 296.0 11/08/2021   GLUCOSE 85 11/08/2021   CHOL 173 11/08/2021   TRIG 96.0 11/08/2021  HDL 53.30 11/08/2021   LDLCALC 101 (H) 11/08/2021   ALT 21 11/08/2021   AST 19 11/08/2021   NA 138 11/08/2021   K 4.2 11/08/2021   CL 104 11/08/2021   CREATININE 0.55 11/08/2021   BUN 10 11/08/2021   CO2 28 11/08/2021   TSH 1.42 11/08/2021    MM 3D SCREEN BREAST BILATERAL  Result Date: 09/01/2021 CLINICAL DATA:  Screening. EXAM: DIGITAL SCREENING BILATERAL MAMMOGRAM WITH TOMOSYNTHESIS AND CAD TECHNIQUE: Bilateral screening digital craniocaudal and mediolateral oblique mammograms were obtained. Bilateral screening digital breast tomosynthesis was performed. The images were evaluated with computer-aided detection. COMPARISON:  Previous exam(s). ACR Breast Density Category c: The breast tissue is heterogeneously dense, which may obscure small masses. FINDINGS: There are no findings suspicious for malignancy. IMPRESSION: No mammographic evidence of malignancy. A result letter of this screening mammogram will be mailed directly to the patient. RECOMMENDATION: Screening mammogram in one year. (Code:SM-B-01Y) BI-RADS CATEGORY  1: Negative. Electronically Signed   By: Everlean Alstrom M.D.   On: 09/01/2021 09:20       Assessment & Plan:   Problem List Items Addressed This Visit     Health care maintenance    Physical today 11/08/21.  PAP 10/04/20 - negative with negative HPV.  Mammogram 09/01/21 - Birads I.  Colonoscopy 02/04/21.  Recommended f/u in 7 years.        Hypercholesterolemia    Continue crestor.  Low cholesterol diet and exercise.  Follow lipid panel and liver function tests.       Relevant Orders   CBC with Differential/Platelet (Completed)   Basic metabolic panel (Completed)   Hepatic function panel (Completed)   Lipid panel (Completed)   TSH  (Completed)   Left knee pain    Left knee pain as outlined.  Getting better.  Follow.  Notify me if persistent.       Rash    Rash occurred with ozempic and on rechallenge.  Also noticed - with eating pizza.  Discussed referral to allergist. She wants to hold.  Follow.        Other Visit Diagnoses     Routine general medical examination at a health care facility    -  Primary        Einar Pheasant, MD

## 2021-11-08 NOTE — Assessment & Plan Note (Signed)
Physical today 11/08/21.  PAP 10/04/20 - negative with negative HPV.  Mammogram 09/01/21 - Birads I.  Colonoscopy 02/04/21.  Recommended f/u in 7 years.

## 2021-11-09 ENCOUNTER — Telehealth: Payer: Self-pay

## 2021-11-09 ENCOUNTER — Encounter: Payer: Self-pay | Admitting: Internal Medicine

## 2021-11-09 DIAGNOSIS — M25562 Pain in left knee: Secondary | ICD-10-CM | POA: Insufficient documentation

## 2021-11-09 NOTE — Assessment & Plan Note (Signed)
Continue crestor.  Low cholesterol diet and exercise. Follow lipid panel and liver function tests.   

## 2021-11-09 NOTE — Assessment & Plan Note (Signed)
Left knee pain as outlined.  Getting better.  Follow.  Notify me if persistent.

## 2021-11-09 NOTE — Assessment & Plan Note (Signed)
Rash occurred with ozempic and on rechallenge.  Also noticed - with eating pizza.  Discussed referral to allergist. She wants to hold.  Follow.

## 2021-11-09 NOTE — Telephone Encounter (Signed)
LMTCB for labs. 

## 2021-12-13 ENCOUNTER — Encounter: Payer: Self-pay | Admitting: Internal Medicine

## 2021-12-13 DIAGNOSIS — R21 Rash and other nonspecific skin eruption: Secondary | ICD-10-CM

## 2021-12-15 NOTE — Telephone Encounter (Signed)
I can place referral. Just need to confirm ok with you

## 2021-12-15 NOTE — Telephone Encounter (Signed)
Yes - ok to place referral.  Dr Donneta Romberg or Dr Remus Blake - [facility] here in [place] - ok.

## 2022-01-24 ENCOUNTER — Ambulatory Visit: Payer: Self-pay | Admitting: Pharmacist

## 2022-01-24 NOTE — Chronic Care Management (AMB) (Signed)
?  Chronic Care Management  ? ?Note ? ?01/24/2022 ?Name: Alison Rodriguez MRN: [id number] DOB: Nov 24, 1960 ? ? ? ?Closing pharmacy CCM case at this time.  Patient has clinic contact information for future questions or concerns.  ? ?Catie Darnelle Maffucci, PharmD, [place], CPP ?Clinical Pharmacist ?Therapist, music at [employer] ?[phone] ? ?

## 2022-03-07 ENCOUNTER — Other Ambulatory Visit: Payer: Self-pay | Admitting: Internal Medicine

## 2022-03-08 ENCOUNTER — Other Ambulatory Visit: Payer: Self-pay

## 2022-03-08 ENCOUNTER — Encounter: Payer: Self-pay | Admitting: Internal Medicine

## 2022-03-08 MED ORDER — ROSUVASTATIN CALCIUM 10 MG PO TABS: 10.0000 mg | ORAL_TABLET | Freq: Every day | ORAL | 11 refills | Status: DC

## 2022-03-15 ENCOUNTER — Encounter: Payer: Self-pay | Admitting: Internal Medicine

## 2022-03-15 ENCOUNTER — Ambulatory Visit (INDEPENDENT_AMBULATORY_CARE_PROVIDER_SITE_OTHER): Payer: 59 | Admitting: Internal Medicine

## 2022-03-15 VITALS — BP 122/82 | HR 80 | Temp 98.5°F | Resp 17 | Ht 60.0 in | Wt 164.0 lb

## 2022-03-15 DIAGNOSIS — Z1211 Encounter for screening for malignant neoplasm of colon: Secondary | ICD-10-CM | POA: Diagnosis not present

## 2022-03-15 DIAGNOSIS — E7800 Pure hypercholesterolemia, unspecified: Secondary | ICD-10-CM | POA: Diagnosis not present

## 2022-03-15 DIAGNOSIS — Z1231 Encounter for screening mammogram for malignant neoplasm of breast: Secondary | ICD-10-CM

## 2022-03-15 DIAGNOSIS — R519 Headache, unspecified: Secondary | ICD-10-CM | POA: Diagnosis not present

## 2022-03-15 LAB — BASIC METABOLIC PANEL
BUN: 11 mg/dL (ref 6–23)
CO2: 28 mEq/L (ref 19–32)
Calcium: 9.3 mg/dL (ref 8.4–10.5)
Chloride: 105 mEq/L (ref 96–112)
Creatinine, Ser: 0.6 mg/dL (ref 0.40–1.20)
GFR: 98.97 mL/min (ref >60.00)
Glucose, Bld: 79 mg/dL (ref 70–99)
Potassium: 4.1 mEq/L (ref 3.5–5.1)
Sodium: 141 mEq/L (ref 135–145)

## 2022-03-15 LAB — LIPID PANEL
Cholesterol: 185 mg/dL (ref 0–200)
HDL: 63.6 mg/dL (ref >39.00)
LDL Cholesterol: 109 mg/dL — ABNORMAL HIGH (ref 0–99)
NonHDL: 121.27
Total CHOL/HDL Ratio: 3
Triglycerides: 62 mg/dL (ref 0.0–149.0)
VLDL: 12.4 mg/dL (ref 0.0–40.0)

## 2022-03-15 LAB — HEPATIC FUNCTION PANEL
ALT: 20 U/L (ref 0–35)
AST: 16 U/L (ref 0–37)
Albumin: 4.2 g/dL (ref 3.5–5.2)
Alkaline Phosphatase: 72 U/L (ref 39–117)
Bilirubin, Direct: 0.1 mg/dL (ref 0.0–0.3)
Total Bilirubin: 0.4 mg/dL (ref 0.2–1.2)
Total Protein: 7.2 g/dL (ref 6.0–8.3)

## 2022-03-15 NOTE — Progress Notes (Signed)
Patient ID: Alison Rodriguez, female   DOB: [birth year], 61 y.o.   MRN: [id number] ? ? ?Subjective:  ? ? Patient ID: Alison Rodriguez, female    DOB: 07-Apr-1961, 61 y.o.   MRN: [id number] ? ?This visit occurred during the SARS-CoV-2 public health emergency.  Safety protocols were in place, including screening questions prior to the visit, additional usage of staff PPE, and extensive cleaning of exam room while observing appropriate contact time as indicated for disinfecting solutions.  ? ?Patient here for a scheduled follow up.  ? ?Chief Complaint  ?Patient presents with  ? Follow-up  ?  4 mo f/u HLD  ? .  ? ?HPI ?Has noticed some occasional headaches recently.  2-3 - in the last several weeks.  Notices in am.  Waking up with headaches.  Some sensitivity to light.  Associated nausea.  Some fatigue.  Does snore.  No chest pain or sob reported.  No abdominal pain or bowel change reported.   ? ? ?Past Medical History:  ?Diagnosis Date  ? Blood in stool   ? H/O  ? Hyperlipidemia   ? Hypertension   ? Thyroid disease   ? ?Past Surgical History:  ?Procedure Laterality Date  ? COLONOSCOPY WITH PROPOFOL N/A 02/04/2021  ? Procedure: COLONOSCOPY WITH PROPOFOL;  Surgeon: Lucilla Lame, MD;  Location: [place];  Service: Endoscopy;  Laterality: N/A;  priority 4  ? POLYPECTOMY  02/04/2021  ? Procedure: POLYPECTOMY;  Surgeon: Lucilla Lame, MD;  Location: [place];  Service: Endoscopy;;  ? UTERINE FIBROID EMBOLIZATION    ? ?Family History  ?Problem Relation Age of Onset  ? Diabetes Mother   ? Diabetes Maternal Grandmother   ? Arthritis Maternal Grandfather   ? Diabetes Maternal Grandfather   ? Breast cancer Other   ?     second cousins/aunt  ? ?Social History  ? ?Socioeconomic History  ? Marital status: Married  ?  Spouse name: Not on file  ? Number of children: Not on file  ? Years of education: Not on file  ? Highest education level: Not on file  ?Occupational History  ? Not on file  ?Tobacco Use  ? Smoking status:  Passive Smoke Exposure - Never Smoker  ? Smokeless tobacco: Never  ?Substance and Sexual Activity  ? Alcohol use: No  ?  Alcohol/week: 0.0 standard drinks  ? Drug use: No  ? Sexual activity: Not on file  ?Other Topics Concern  ? Not on file  ?Social History Narrative  ? Not on file  ? ?Social Determinants of Health  ? ?Financial Resource Strain: Low Risk   ? Difficulty of Paying Living Expenses: Not hard at all  ?Food Insecurity: Not on file  ?Transportation Needs: Not on file  ?Physical Activity: Not on file  ?Stress: Not on file  ?Social Connections: Not on file  ? ? ? ?Review of Systems  ?Constitutional:  Negative for appetite change and unexpected weight change.  ?HENT:  Negative for congestion and sinus pressure.   ?Respiratory:  Negative for cough, chest tightness and shortness of breath.   ?Cardiovascular:  Negative for chest pain, palpitations and leg swelling.  ?Gastrointestinal:  Negative for abdominal pain, diarrhea, nausea and vomiting.  ?Genitourinary:  Negative for difficulty urinating and dysuria.  ?Musculoskeletal:  Negative for joint swelling and myalgias.  ?Skin:  Negative for color change and rash.  ?Neurological:  Positive for headaches. Negative for dizziness and light-headedness.  ?Psychiatric/Behavioral:  Negative for agitation and dysphoric mood.   ? ?   ?  Objective:  ?  ? ?BP 122/82 (BP Location: Left Arm, Patient Position: Sitting, Cuff Size: Small)   Pulse 80   Temp 98.5 ?F (36.9 ?C) (Temporal)   Resp 17   Ht 5' (1.524 m)   Wt 164 lb (74.4 kg)   LMP 06/22/2015 (Approximate)   SpO2 99%   BMI 32.03 kg/m?  ?Wt Readings from Last 3 Encounters:  ?03/15/22 164 lb (74.4 kg)  ?11/08/21 159 lb 9.6 oz (72.4 kg)  ?08/09/21 161 lb (73 kg)  ? ? ?Physical Exam ?Vitals reviewed.  ?Constitutional:   ?   General: She is not in acute distress. ?   Appearance: Normal appearance.  ?HENT:  ?   Head: Normocephalic and atraumatic.  ?   Right Ear: External ear normal.  ?   Left Ear: External ear normal.   ?Eyes:  ?   General: No scleral icterus.    ?   Right eye: No discharge.     ?   Left eye: No discharge.  ?   Conjunctiva/sclera: Conjunctivae normal.  ?Neck:  ?   Thyroid: No thyromegaly.  ?Cardiovascular:  ?   Rate and Rhythm: Normal rate and regular rhythm.  ?Pulmonary:  ?   Effort: No respiratory distress.  ?   Breath sounds: Normal breath sounds. No wheezing.  ?Abdominal:  ?   General: Bowel sounds are normal.  ?   Palpations: Abdomen is soft.  ?   Tenderness: There is no abdominal tenderness.  ?Musculoskeletal:     ?   General: No swelling or tenderness.  ?   Cervical back: Neck supple. No tenderness.  ?Lymphadenopathy:  ?   Cervical: No cervical adenopathy.  ?Skin: ?   Findings: No erythema or rash.  ?Neurological:  ?   Mental Status: She is alert.  ?Psychiatric:     ?   Mood and Affect: Mood normal.     ?   Behavior: Behavior normal.  ? ? ? ?Outpatient Encounter Medications as of 03/15/2022  ?Medication Sig  ? rosuvastatin (CRESTOR) 10 MG tablet Take 1 tablet (10 mg total) by mouth daily.  ? [DISCONTINUED] famotidine (PEPCID) 40 MG tablet Take 1 tablet (40 mg total) by mouth daily.  ? [DISCONTINUED] hydrocortisone 2.5 % cream Apply to bumps on chest 1-2 times a day as needed for itch.  ? ?No facility-administered encounter medications on file as of 03/15/2022.  ?  ? ?Lab Results  ?Component Value Date  ? WBC 5.3 11/08/2021  ? HGB 14.0 11/08/2021  ? HCT 44.2 11/08/2021  ? PLT 296.0 11/08/2021  ? GLUCOSE 79 03/15/2022  ? CHOL 185 03/15/2022  ? TRIG 62.0 03/15/2022  ? HDL 63.60 03/15/2022  ? LDLCALC 109 (H) 03/15/2022  ? ALT 20 03/15/2022  ? AST 16 03/15/2022  ? NA 141 03/15/2022  ? K 4.1 03/15/2022  ? CL 105 03/15/2022  ? CREATININE 0.60 03/15/2022  ? BUN 11 03/15/2022  ? CO2 28 03/15/2022  ? TSH 1.42 11/08/2021  ? ? ?MM 3D SCREEN BREAST BILATERAL ? ?Result Date: 09/01/2021 ?CLINICAL DATA:  Screening. EXAM: DIGITAL SCREENING BILATERAL MAMMOGRAM WITH TOMOSYNTHESIS AND CAD TECHNIQUE: Bilateral screening digital  craniocaudal and mediolateral oblique mammograms were obtained. Bilateral screening digital breast tomosynthesis was performed. The images were evaluated with computer-aided detection. COMPARISON:  Previous exam(s). ACR Breast Density Category c: The breast tissue is heterogeneously dense, which may obscure small masses. FINDINGS: There are no findings suspicious for malignancy. IMPRESSION: No mammographic evidence of malignancy. A result letter of  this screening mammogram will be mailed directly to the patient. RECOMMENDATION: Screening mammogram in one year. (Code:SM-B-01Y) BI-RADS CATEGORY  1: Negative. Electronically Signed   By: Everlean Alstrom M.D.   On: 09/01/2021 09:20  ? ? ?   ?Assessment & Plan:  ? ?Problem List Items Addressed This Visit   ? ? Colon cancer screening  ?  Colonoscopy 02/04/21 - pathology: One 4 mm polyp in the descending colon, removed with a cold snare. Resected and retrieved. Non-bleeding internal hemorrhoids. Pathology tubular adenoma.  F/u colonoscopy 7 years (per Dr Allen Norris).  ? ?  ?  ? Headache  ?  Headaches as outlined.  Has noticed a few headaches in the last several weeks.  AM headaches.  Snores.  Some fatigue.  Discussed possible etiologies.  Discussed possible sleep apnea.  Refer to pulmonary for evaluation and question of need for sleep study.   ? ?  ?  ? Relevant Orders  ? Ambulatory referral to Pulmonology  ? Hypercholesterolemia  ?  Continue crestor.  Low cholesterol diet and exercise.  Follow lipid panel and liver function tests.  ? ?  ?  ? Relevant Orders  ? Lipid Profile (Completed)  ? Basic Metabolic Panel (BMET) (Completed)  ? Hepatic function panel (Completed)  ? ?Other Visit Diagnoses   ? ? Encounter for screening mammogram for malignant neoplasm of breast    -  Primary  ? Relevant Orders  ? MM 3D SCREEN BREAST BILATERAL  ? ?  ? ? ? ?Einar Pheasant, MD  ?

## 2022-03-26 ENCOUNTER — Encounter: Payer: Self-pay | Admitting: Internal Medicine

## 2022-03-26 NOTE — Assessment & Plan Note (Signed)
Headaches as outlined.  Has noticed a few headaches in the last several weeks.  AM headaches.  Snores.  Some fatigue.  Discussed possible etiologies.  Discussed possible sleep apnea.  Refer to pulmonary for evaluation and question of need for sleep study.   ?

## 2022-03-26 NOTE — Assessment & Plan Note (Signed)
Continue crestor.  Low cholesterol diet and exercise. Follow lipid panel and liver function tests.   

## 2022-03-26 NOTE — Assessment & Plan Note (Addendum)
Colonoscopy 02/04/21 - pathology: One 4 mm polyp in the descending colon, removed with a cold snare. Resected and retrieved. Non-bleeding internal hemorrhoids. Pathology tubular adenoma.  F/u colonoscopy 7 years (per Dr Wohl).  

## 2022-06-06 ENCOUNTER — Ambulatory Visit (INDEPENDENT_AMBULATORY_CARE_PROVIDER_SITE_OTHER): Payer: 59 | Admitting: Adult Health

## 2022-06-06 ENCOUNTER — Encounter: Payer: Self-pay | Admitting: Adult Health

## 2022-06-06 VITALS — BP 120/80 | HR 67 | Temp 97.7°F | Ht 59.0 in | Wt 167.0 lb

## 2022-06-06 DIAGNOSIS — Z6834 Body mass index (BMI) 34.0-34.9, adult: Secondary | ICD-10-CM

## 2022-06-06 DIAGNOSIS — G4719 Other hypersomnia: Secondary | ICD-10-CM | POA: Diagnosis not present

## 2022-06-06 DIAGNOSIS — R0683 Snoring: Secondary | ICD-10-CM | POA: Insufficient documentation

## 2022-06-06 NOTE — Patient Instructions (Signed)
Set up for home sleep study Work on healthy weight Do not drive if sleepy Follow-up in 6 to 8 weeks to discuss sleep study results and treatment plan

## 2022-06-06 NOTE — Progress Notes (Signed)
Reviewed and agree with assessment/plan.   Chesley Mires, MD [facility] Pulmonary/Critical Care 06/06/2022, 10:40 AM Pager:  [phone]

## 2022-06-06 NOTE — Assessment & Plan Note (Signed)
Work on healthy weight loss 

## 2022-06-06 NOTE — Progress Notes (Signed)
$'@Patient'S$  ID: Alison Rodriguez, female    DOB: August 28, 1961, 61 y.o.   MRN: [id number]  Chief Complaint  Patient presents with   sleep consult    Referring provider: Einar Pheasant, MD  HPI: 61 year old female seen for sleep consult June 06, 2022 for snoring and daytime sleepiness  TEST/EVENTS :   06/06/2022 Sleep consult  Patient presents for sleep consult today.  Kindly referred by her primary care provider Dr. Nicki Reaper.  Patient complains of snoring, daytime sleepiness and morning headaches.  Trying to figure out what is causing the headaches .  Husband has OSA . He complains that she snores very loudly . Falls asleep very asleep. Has fallen asleep at movies.  Typically goes to bed about 11:30 PM.  Takes only a few minutes to go to sleep.  Is up 3-4 times each night.  Gets up about 5:45 AM.  Wakes up tired, un-refreshed. Has never had a previous sleep study.  Does not operate heavy machinery for work.  No significant weight change.  Patient's current weight is 167 pounds with a BMI at 33.  Does not use sleep aids.  Caffeine intake 2 cups of coffee.   No symptoms suspicious for cataplexy or sleep paralysis.  Epworth score is 11 out of 24.  Typically gets sleepy if she is sitting and watching TV or in the afternoon hours. No history of CHF or CVA . No removable dental work.   Social history patient is married.  Lives with her husband.  Works in [employer].  Does not have any children.  She is a never smoker.  No alcohol or drug use. Husband smokes-second hand smoke   Family history diabetes, breast cancer   surgical history.  Uterine fibroid embolization, polypectomy   No Known Allergies  Immunization History  Administered Date(s) Administered   Influenza Split 10/26/2013   Influenza,inj,Quad PF,6+ Mos 09/11/2017, 10/04/2020   Influenza-Unspecified 09/14/2014   Janssen (J&J) SARS-COV-2 Vaccination 01/31/2020   PFIZER Comirnaty(Gray Top)Covid-19 Tri-Sucrose Vaccine 04/02/2021    PFIZER(Purple Top)SARS-COV-2 Vaccination 06/09/2020    Past Medical History:  Diagnosis Date   Blood in stool    H/O   Hyperlipidemia    Hypertension    Thyroid disease     Tobacco History: Social History   Tobacco Use  Smoking Status Never   Passive exposure: Yes  Smokeless Tobacco Never   Counseling given: Not Answered   Outpatient Medications Prior to Visit  Medication Sig Dispense Refill   rosuvastatin (CRESTOR) 10 MG tablet Take 1 tablet (10 mg total) by mouth daily. 30 tablet 11   No facility-administered medications prior to visit.     Review of Systems:   Constitutional:   No  weight loss, night sweats,  Fevers, chills, + fatigue, or  lassitude.  HEENT:   No headaches,  Difficulty swallowing,  Tooth/dental problems, or  Sore throat,                No sneezing, itching, ear ache, nasal congestion, post nasal drip,   CV:  No chest pain,  Orthopnea, PND, swelling in lower extremities, anasarca, dizziness, palpitations, syncope.   GI  No heartburn, indigestion, abdominal pain, nausea, vomiting, diarrhea, change in bowel habits, loss of appetite, bloody stools.   Resp: No shortness of breath with exertion or at rest.  No excess mucus, no productive cough,  No non-productive cough,  No coughing up of blood.  No change in color of mucus.  No wheezing.  No chest wall deformity  Skin: no rash or lesions.  GU: no dysuria, change in color of urine, no urgency or frequency.  No flank pain, no hematuria   MS:  No joint pain or swelling.  No decreased range of motion.  No back pain.    Physical Exam  BP 120/80 (BP Location: Left Arm, Cuff Size: Normal)   Pulse 67   Temp 97.7 F (36.5 C) (Temporal)   Ht '4\' 11"'$  (1.499 m)   Wt 167 lb (75.8 kg)   LMP 06/22/2015 (Approximate)   SpO2 99%   BMI 33.73 kg/m   GEN: A/Ox3; pleasant , NAD, well nourished    HEENT:  [place]/AT,   NOSE-clear, THROAT-clear, no lesions, no postnasal drip or exudate noted.  Class 3 MP airway    NECK:  Supple w/ fair ROM; no JVD; normal carotid impulses w/o bruits; no thyromegaly or nodules palpated; no lymphadenopathy.    RESP  Clear  P & A; w/o, wheezes/ rales/ or rhonchi. no accessory muscle use, no dullness to percussion  CARD:  RRR, no m/r/g, no peripheral edema, pulses intact, no cyanosis or clubbing.  GI:   Soft & nt; nml bowel sounds; no organomegaly or masses detected.   Musco: Warm bil, no deformities or joint swelling noted.   Neuro: alert, no focal deficits noted.    Skin: Warm, no lesions or rashes    Lab Results:  CBC    BNP No results found for: "BNP"  ProBNP No results found for: "PROBNP"  Imaging: No results found.        No data to display          No results found for: "NITRICOXIDE"      Assessment & Plan:   Snoring Loud snoring, restless sleep, daytime sleepiness, morning headaches all suspicious for underlying sleep apnea.  BMI is 33.  - discussed how weight can impact sleep and risk for sleep disordered breathing - discussed options to assist with weight loss: combination of diet modification, cardiovascular and strength training exercises   - had an extensive discussion regarding the adverse health consequences related to untreated sleep disordered breathing - specifically discussed the risks for hypertension, coronary artery disease, cardiac dysrhythmias, cerebrovascular disease, and diabetes - lifestyle modification discussed   - discussed how sleep disruption can increase risk of accidents, particularly when driving - safe driving practices were discussed   Plan  Patient Instructions  Set up for home sleep study Work on healthy weight Do not drive if sleepy Follow-up in 6 to 8 weeks to discuss sleep study results and treatment plan     BMI 34.0-34.9,adult Work on healthy weight loss     Rexene Edison, NP 06/06/2022

## 2022-06-06 NOTE — Assessment & Plan Note (Signed)
Loud snoring, restless sleep, daytime sleepiness, morning headaches all suspicious for underlying sleep apnea.  BMI is 33.  - discussed how weight can impact sleep and risk for sleep disordered breathing - discussed options to assist with weight loss: combination of diet modification, cardiovascular and strength training exercises   - had an extensive discussion regarding the adverse health consequences related to untreated sleep disordered breathing - specifically discussed the risks for hypertension, coronary artery disease, cardiac dysrhythmias, cerebrovascular disease, and diabetes - lifestyle modification discussed   - discussed how sleep disruption can increase risk of accidents, particularly when driving - safe driving practices were discussed   Plan  Patient Instructions  Set up for home sleep study Work on healthy weight Do not drive if sleepy Follow-up in 6 to 8 weeks to discuss sleep study results and treatment plan

## 2022-06-30 ENCOUNTER — Ambulatory Visit: Payer: 59

## 2022-06-30 DIAGNOSIS — G4733 Obstructive sleep apnea (adult) (pediatric): Secondary | ICD-10-CM | POA: Diagnosis not present

## 2022-06-30 DIAGNOSIS — G4719 Other hypersomnia: Secondary | ICD-10-CM

## 2022-07-10 DIAGNOSIS — G4733 Obstructive sleep apnea (adult) (pediatric): Secondary | ICD-10-CM

## 2022-07-11 ENCOUNTER — Telehealth: Payer: Self-pay | Admitting: Adult Health

## 2022-07-11 NOTE — Telephone Encounter (Signed)
-----   Message from Alison Rodriguez sent at 07/10/2022  4:15 PM EDT ----- Hello: this patient's HST report is ready for review

## 2022-07-11 NOTE — Telephone Encounter (Signed)
Called and left voicemail for patient to call office back to schedule video visit or office visit with TP to go over home sleep study results

## 2022-07-11 NOTE — Telephone Encounter (Signed)
Home sleep study done 11/08/2022 shows moderate sleep apnea with AHI at 17.3/hour and SPO2 low at 83%.  Please set up an in person or virtual visit to discussed sleep study results and treatment plan.  May double book.

## 2022-07-13 NOTE — Telephone Encounter (Signed)
ATC x2.  LVM to return call.

## 2022-07-17 ENCOUNTER — Encounter: Payer: Self-pay | Admitting: Internal Medicine

## 2022-07-17 ENCOUNTER — Ambulatory Visit (INDEPENDENT_AMBULATORY_CARE_PROVIDER_SITE_OTHER): Payer: 59 | Admitting: Internal Medicine

## 2022-07-17 VITALS — BP 122/80 | HR 76 | Temp 98.6°F | Resp 15 | Ht 60.0 in | Wt 166.4 lb

## 2022-07-17 DIAGNOSIS — G4730 Sleep apnea, unspecified: Secondary | ICD-10-CM | POA: Diagnosis not present

## 2022-07-17 DIAGNOSIS — E7800 Pure hypercholesterolemia, unspecified: Secondary | ICD-10-CM

## 2022-07-17 DIAGNOSIS — Z1211 Encounter for screening for malignant neoplasm of colon: Secondary | ICD-10-CM

## 2022-07-17 DIAGNOSIS — R519 Headache, unspecified: Secondary | ICD-10-CM

## 2022-07-17 DIAGNOSIS — G4733 Obstructive sleep apnea (adult) (pediatric): Secondary | ICD-10-CM | POA: Insufficient documentation

## 2022-07-17 LAB — LIPID PANEL
Cholesterol: 158 mg/dL (ref 0–200)
HDL: 49.7 mg/dL (ref >39.00)
LDL Cholesterol: 94 mg/dL (ref 0–99)
NonHDL: 108.62
Total CHOL/HDL Ratio: 3
Triglycerides: 74 mg/dL (ref 0.0–149.0)
VLDL: 14.8 mg/dL (ref 0.0–40.0)

## 2022-07-17 LAB — HEPATIC FUNCTION PANEL
ALT: 17 U/L (ref 0–35)
AST: 15 U/L (ref 0–37)
Albumin: 3.8 g/dL (ref 3.5–5.2)
Alkaline Phosphatase: 60 U/L (ref 39–117)
Bilirubin, Direct: 0.1 mg/dL (ref 0.0–0.3)
Total Bilirubin: 0.4 mg/dL (ref 0.2–1.2)
Total Protein: 6.6 g/dL (ref 6.0–8.3)

## 2022-07-17 LAB — BASIC METABOLIC PANEL
BUN: 9 mg/dL (ref 6–23)
CO2: 27 mEq/L (ref 19–32)
Calcium: 9 mg/dL (ref 8.4–10.5)
Chloride: 105 mEq/L (ref 96–112)
Creatinine, Ser: 0.58 mg/dL (ref 0.40–1.20)
GFR: 99.55 mL/min (ref >60.00)
Glucose, Bld: 88 mg/dL (ref 70–99)
Potassium: 4.1 mEq/L (ref 3.5–5.1)
Sodium: 140 mEq/L (ref 135–145)

## 2022-07-17 NOTE — Progress Notes (Unsigned)
Patient ID: Alison Rodriguez, female   DOB: 09/06/1961, 61 y.o.   MRN: [id number]   Subjective:    Patient ID: Alison Rodriguez, female    DOB: 02/16/1961, 61 y.o.   MRN: [id number]   Patient here for a scheduled follow up .   HPI Here to follow regarding increased cholesterol.  Also recently evaluated by pulmonary for sleep apnea.  Had HST - revealed sleep apnea.  Planning to call for f/u today to discuss treatment options.  She reports she is overall doing relatively well.  No chest pain.  Breathing stable.  No acid reflux.  No abdominal pain.  Bowels moving.  States has noticed some headache with increased heat and decreased water intake.  Not an issue otherwise.  No headache now.    Past Medical History:  Diagnosis Date   Blood in stool    H/O   Hyperlipidemia    Hypertension    Thyroid disease    Past Surgical History:  Procedure Laterality Date   COLONOSCOPY WITH PROPOFOL N/A 02/04/2021   Procedure: COLONOSCOPY WITH PROPOFOL;  Surgeon: Lucilla Lame, MD;  Location: [place];  Service: Endoscopy;  Laterality: N/A;  priority 4   POLYPECTOMY  02/04/2021   Procedure: POLYPECTOMY;  Surgeon: Lucilla Lame, MD;  Location: [place];  Service: Endoscopy;;   UTERINE FIBROID EMBOLIZATION     Family History  Problem Relation Age of Onset   Diabetes Mother    Diabetes Maternal Grandmother    Arthritis Maternal Grandfather    Diabetes Maternal Grandfather    Breast cancer Other        second cousins/aunt   Social History   Socioeconomic History   Marital status: Married    Spouse name: Not on file   Number of children: Not on file   Years of education: Not on file   Highest education level: Not on file  Occupational History   Not on file  Tobacco Use   Smoking status: Never    Passive exposure: Yes   Smokeless tobacco: Never  Substance and Sexual Activity   Alcohol use: No    Alcohol/week: 0.0 standard drinks of alcohol   Drug use: No   Sexual activity:  Not on file  Other Topics Concern   Not on file  Social History Narrative   Not on file   Social Determinants of Health   Financial Resource Strain: Low Risk  (09/29/2021)   Overall Financial Resource Strain (CARDIA)    Difficulty of Paying Living Expenses: Not hard at all  Food Insecurity: Not on file  Transportation Needs: Not on file  Physical Activity: Not on file  Stress: Not on file  Social Connections: Not on file     Review of Systems  Constitutional:  Negative for appetite change and unexpected weight change.  HENT:  Negative for congestion and sinus pressure.   Respiratory:  Negative for cough, chest tightness and shortness of breath.   Cardiovascular:  Negative for chest pain, palpitations and leg swelling.  Gastrointestinal:  Negative for abdominal pain, diarrhea, nausea and vomiting.  Genitourinary:  Negative for difficulty urinating and dysuria.  Musculoskeletal:  Negative for joint swelling and myalgias.  Skin:  Negative for color change and rash.  Neurological:  Negative for dizziness, light-headedness and headaches.  Psychiatric/Behavioral:  Negative for agitation and dysphoric mood.        Objective:     BP 122/80 (BP Location: Left Arm, Patient Position: Sitting, Cuff Size: Large)   Pulse  76   Temp 98.6 F (37 C) (Temporal)   Resp 15   Ht 5' (1.524 m)   Wt 166 lb 6.4 oz (75.5 kg)   LMP 06/22/2015 (Approximate)   SpO2 95%   BMI 32.50 kg/m  Wt Readings from Last 3 Encounters:  07/17/22 166 lb 6.4 oz (75.5 kg)  06/06/22 167 lb (75.8 kg)  03/15/22 164 lb (74.4 kg)    Physical Exam Vitals reviewed.  Constitutional:      General: She is not in acute distress.    Appearance: Normal appearance.  HENT:     Head: Normocephalic and atraumatic.     Right Ear: External ear normal.     Left Ear: External ear normal.  Eyes:     General: No scleral icterus.       Right eye: No discharge.        Left eye: No discharge.     Conjunctiva/sclera:  Conjunctivae normal.  Neck:     Thyroid: No thyromegaly.  Cardiovascular:     Rate and Rhythm: Normal rate and regular rhythm.  Pulmonary:     Effort: No respiratory distress.     Breath sounds: Normal breath sounds. No wheezing.  Abdominal:     General: Bowel sounds are normal.     Palpations: Abdomen is soft.     Tenderness: There is no abdominal tenderness.  Musculoskeletal:        General: No swelling or tenderness.     Cervical back: Neck supple. No tenderness.  Lymphadenopathy:     Cervical: No cervical adenopathy.  Skin:    Findings: No erythema or rash.  Neurological:     Mental Status: She is alert.  Psychiatric:        Mood and Affect: Mood normal.        Behavior: Behavior normal.      Outpatient Encounter Medications as of 07/17/2022  Medication Sig   rosuvastatin (CRESTOR) 10 MG tablet Take 1 tablet (10 mg total) by mouth daily.   No facility-administered encounter medications on file as of 07/17/2022.     Lab Results  Component Value Date   WBC 5.3 11/08/2021   HGB 14.0 11/08/2021   HCT 44.2 11/08/2021   PLT 296.0 11/08/2021   GLUCOSE 88 07/17/2022   CHOL 158 07/17/2022   TRIG 74.0 07/17/2022   HDL 49.70 07/17/2022   LDLCALC 94 07/17/2022   ALT 17 07/17/2022   AST 15 07/17/2022   NA 140 07/17/2022   K 4.1 07/17/2022   CL 105 07/17/2022   CREATININE 0.58 07/17/2022   BUN 9 07/17/2022   CO2 27 07/17/2022   TSH 1.42 11/08/2021    MM 3D SCREEN BREAST BILATERAL  Result Date: 09/01/2021 CLINICAL DATA:  Screening. EXAM: DIGITAL SCREENING BILATERAL MAMMOGRAM WITH TOMOSYNTHESIS AND CAD TECHNIQUE: Bilateral screening digital craniocaudal and mediolateral oblique mammograms were obtained. Bilateral screening digital breast tomosynthesis was performed. The images were evaluated with computer-aided detection. COMPARISON:  Previous exam(s). ACR Breast Density Category c: The breast tissue is heterogeneously dense, which may obscure small masses. FINDINGS:  There are no findings suspicious for malignancy. IMPRESSION: No mammographic evidence of malignancy. A result letter of this screening mammogram will be mailed directly to the patient. RECOMMENDATION: Screening mammogram in one year. (Code:SM-B-01Y) BI-RADS CATEGORY  1: Negative. Electronically Signed   By: Everlean Alstrom M.D.   On: 09/01/2021 09:20       Assessment & Plan:   Problem List Items Addressed This Visit  Colon cancer screening    Colonoscopy 02/04/21 - pathology: One 4 mm polyp in the descending colon, removed with a cold snare. Resected and retrieved. Non-bleeding internal hemorrhoids. Pathology tubular adenoma.  F/u colonoscopy 7 years (per Dr Allen Norris).       Headache    No headache now.  Noticed occurs in heat and with decreased hydration.  Follow.       Hypercholesterolemia - Primary    Continue crestor.  Low cholesterol diet and exercise.  Follow lipid panel and liver function tests.       Relevant Orders   Lipid Profile (Completed)   Hepatic function panel (Completed)   Basic Metabolic Panel (BMET) (Completed)   Sleep apnea    Recent HST - sleep apnea. Plans to call to f/u regarding results.          Einar Pheasant, MD

## 2022-07-18 ENCOUNTER — Encounter: Payer: Self-pay | Admitting: Internal Medicine

## 2022-07-18 NOTE — Assessment & Plan Note (Signed)
Colonoscopy 02/04/21 - pathology: One 4 mm polyp in the descending colon, removed with a cold snare. Resected and retrieved. Non-bleeding internal hemorrhoids. Pathology tubular adenoma.  F/u colonoscopy 7 years (per Dr Wohl).  

## 2022-07-18 NOTE — Assessment & Plan Note (Signed)
Recent HST - sleep apnea. Plans to call to f/u regarding results.

## 2022-07-18 NOTE — Assessment & Plan Note (Signed)
No headache now.  Noticed occurs in heat and with decreased hydration.  Follow.

## 2022-07-18 NOTE — Assessment & Plan Note (Signed)
Continue crestor.  Low cholesterol diet and exercise. Follow lipid panel and liver function tests.   

## 2022-07-19 ENCOUNTER — Encounter: Payer: Self-pay | Admitting: *Deleted

## 2022-07-19 NOTE — Telephone Encounter (Signed)
Unable to reach letter sent

## 2022-07-28 NOTE — Telephone Encounter (Signed)
Patient states that she has a follow up on 09/12/2022 to discuss sleep study. Offered to look for sooner appointment, patient was okay to wait until 10/24.

## 2022-08-02 NOTE — Telephone Encounter (Signed)
Nothing further needed 

## 2022-09-01 ENCOUNTER — Ambulatory Visit
Admission: RE | Admit: 2022-09-01 | Discharge: 2022-09-01 | Disposition: A | Discharge status: Home / Self Care | Payer: 59 | Source: Ambulatory Visit | Attending: Internal Medicine | Admitting: Internal Medicine

## 2022-09-01 DIAGNOSIS — Z1231 Encounter for screening mammogram for malignant neoplasm of breast: Secondary | ICD-10-CM

## 2022-09-02 ENCOUNTER — Other Ambulatory Visit: Payer: Self-pay | Admitting: Internal Medicine

## 2022-09-02 DIAGNOSIS — R928 Other abnormal and inconclusive findings on diagnostic imaging of breast: Secondary | ICD-10-CM

## 2022-09-02 NOTE — Progress Notes (Signed)
Order placed for f/u left breast mammogram and possible ultrasound.

## 2022-09-04 ENCOUNTER — Encounter: Payer: Self-pay | Admitting: Internal Medicine

## 2022-09-04 DIAGNOSIS — R928 Other abnormal and inconclusive findings on diagnostic imaging of breast: Secondary | ICD-10-CM

## 2022-09-06 NOTE — Telephone Encounter (Signed)
Order placed for breast center in [place]. Please fax order and notify pt.  See attached my chart message.

## 2022-09-08 NOTE — Telephone Encounter (Signed)
Pt called and stated they had received order & she was already scheduled.

## 2022-09-12 ENCOUNTER — Encounter: Payer: Self-pay | Admitting: Adult Health

## 2022-09-12 ENCOUNTER — Ambulatory Visit (INDEPENDENT_AMBULATORY_CARE_PROVIDER_SITE_OTHER): Payer: 59 | Admitting: Adult Health

## 2022-09-12 VITALS — BP 128/74 | HR 78 | Temp 97.9°F | Ht 59.0 in | Wt 173.2 lb

## 2022-09-12 DIAGNOSIS — G4733 Obstructive sleep apnea (adult) (pediatric): Secondary | ICD-10-CM | POA: Diagnosis not present

## 2022-09-12 DIAGNOSIS — Z6834 Body mass index (BMI) 34.0-34.9, adult: Secondary | ICD-10-CM

## 2022-09-12 NOTE — Assessment & Plan Note (Signed)
Healthy weight loss 

## 2022-09-12 NOTE — Assessment & Plan Note (Signed)
Moderate OSA - excellent control and compliance on CPAP  Patient education given  - discussed how weight can impact sleep and risk for sleep disordered breathing - discussed options to assist with weight loss: combination of diet modification, cardiovascular and strength training exercises   - had an extensive discussion regarding the adverse health consequences related to untreated sleep disordered breathing - specifically discussed the risks for hypertension, coronary artery disease, cardiac dysrhythmias, cerebrovascular disease, and diabetes - lifestyle modification discussed   - discussed how sleep disruption can increase risk of accidents, particularly when driving - safe driving practices were discussed   Plan  Patient Instructions  Begin CPAP at bedtime, goal is to wear all night long for at least 6 hours each night Dream wear nasal mask  Work on healthy weight loss Do not drive if sleepy Follow-up in 3 months and as needed

## 2022-09-12 NOTE — Progress Notes (Signed)
Reviewed and agree with assessment/plan.   Chesley Mires, MD [facility] 09/12/2022, 12:16 PM Pager:  [phone]

## 2022-09-12 NOTE — Patient Instructions (Addendum)
Begin CPAP at bedtime, goal is to wear all night long for at least 6 hours each night Dream wear nasal mask  Work on healthy weight loss Do not drive if sleepy Follow-up in 3 months and as needed

## 2022-09-12 NOTE — Progress Notes (Signed)
$'@Patient'd$  ID: Alison Rodriguez, female    DOB: Dec 02, 1960, 61 y.o.   MRN: [id number]  Chief Complaint  Patient presents with   Follow-up    Referring provider: Einar Pheasant, MD  HPI: 61 year old female seen for sleep consult June 06, 2022 for snoring and daytime sleepiness found to have moderate obstructive sleep apnea Medical history significant for hyperlipidemia  TEST/EVENTS :   07/01/2022 HST  shows moderate sleep apnea with AHI at 17.3/hour and SPO2 low at 83%.    09/12/2022 Follow up : OSA  Patient presents for a 44-monthfollow-up.  Patient was seen last visit for a sleep consult.  She was having snoring and daytime sleepiness.  He was added for home sleep study that was completed on July 01, 2022 that showed moderate sleep apnea the AHI at 17.3/hour and SPO2 low at 83%.  We discussed her sleep study results.  Went over treatment options including weight loss, oral appliance and CPAP.  Patient like to proceed with CPAP therapy. Wants a small cpap mask.   No Known Allergies  Immunization History  Administered Date(s) Administered   Influenza Split 10/26/2013   Influenza,inj,Quad PF,6+ Mos 09/11/2017, 10/04/2020   Influenza-Unspecified 09/14/2014   Janssen (J&J) SARS-COV-2 Vaccination 01/31/2020   PFIZER Comirnaty(Gray Top)Covid-19 Tri-Sucrose Vaccine 04/02/2021   PFIZER(Purple Top)SARS-COV-2 Vaccination 06/09/2020    Past Medical History:  Diagnosis Date   Blood in stool    H/O   Hyperlipidemia    Hypertension    Thyroid disease     Tobacco History: Social History   Tobacco Use  Smoking Status Never   Passive exposure: Yes  Smokeless Tobacco Never   Counseling given: Not Answered   Outpatient Medications Prior to Visit  Medication Sig Dispense Refill   rosuvastatin (CRESTOR) 10 MG tablet Take 1 tablet (10 mg total) by mouth daily. 30 tablet 11   meloxicam (MOBIC) 15 MG tablet Take 15 mg by mouth daily.     No facility-administered medications prior  to visit.     Review of Systems:   Constitutional:   No  weight loss, night sweats,  Fevers, chills,  +fatigue, or  lassitude.  HEENT:   No headaches,  Difficulty swallowing,  Tooth/dental problems, or  Sore throat,                No sneezing, itching, ear ache, nasal congestion, post nasal drip,   CV:  No chest pain,  Orthopnea, PND, swelling in lower extremities, anasarca, dizziness, palpitations, syncope.   GI  No heartburn, indigestion, abdominal pain, nausea, vomiting, diarrhea, change in bowel habits, loss of appetite, bloody stools.   Resp: No shortness of breath with exertion or at rest.  No excess mucus, no productive cough,  No non-productive cough,  No coughing up of blood.  No change in color of mucus.  No wheezing.  No chest wall deformity  Skin: no rash or lesions.  GU: no dysuria, change in color of urine, no urgency or frequency.  No flank pain, no hematuria   MS:  No joint pain or swelling.  No decreased range of motion.  No back pain.    Physical Exam  BP 128/74 (BP Location: Left Arm, Cuff Size: Normal)   Pulse 78   Temp 97.9 F (36.6 C) (Temporal)   Ht '4\' 11"'$  (1.499 m)   Wt 173 lb 3.2 oz (78.6 kg)   LMP 06/22/2015 (Approximate)   SpO2 100%   BMI 34.98 kg/m   GEN: A/Ox3; pleasant ,  NAD, well nourished    HEENT:  [place]/AT,  EACs-clear, TMs-wnl, NOSE-clear, THROAT-clear, no lesions, no postnasal drip or exudate noted. Class 3 MP airway   NECK:  Supple w/ fair ROM; no JVD; normal carotid impulses w/o bruits; no thyromegaly or nodules palpated; no lymphadenopathy.    RESP  Clear  P & A; w/o, wheezes/ rales/ or rhonchi. no accessory muscle use, no dullness to percussion  CARD:  RRR, no m/r/g, no peripheral edema, pulses intact, no cyanosis or clubbing.  GI:   Soft & nt; nml bowel sounds; no organomegaly or masses detected.   Musco: Warm bil, no deformities or joint swelling noted.   Neuro: alert, no focal deficits noted.    Skin: Warm, no lesions or  rashes    Lab Results:    BNP No results found for: "BNP"  ProBNP No results found for: "PROBNP"  Imaging:        No data to display          No results found for: "NITRICOXIDE"      Assessment & Plan:   OSA (obstructive sleep apnea) Moderate OSA - excellent control and compliance on CPAP  Patient education given  - discussed how weight can impact sleep and risk for sleep disordered breathing - discussed options to assist with weight loss: combination of diet modification, cardiovascular and strength training exercises   - had an extensive discussion regarding the adverse health consequences related to untreated sleep disordered breathing - specifically discussed the risks for hypertension, coronary artery disease, cardiac dysrhythmias, cerebrovascular disease, and diabetes - lifestyle modification discussed   - discussed how sleep disruption can increase risk of accidents, particularly when driving - safe driving practices were discussed   Plan  Patient Instructions  Begin CPAP at bedtime, goal is to wear all night long for at least 6 hours each night Dream wear nasal mask  Work on healthy weight loss Do not drive if sleepy Follow-up in 3 months and as needed     BMI 34.0-34.9,adult Healthy weight loss      Rexene Edison, NP 09/12/2022

## 2022-09-26 ENCOUNTER — Other Ambulatory Visit: Payer: Self-pay | Admitting: Internal Medicine

## 2022-09-26 ENCOUNTER — Ambulatory Visit: Admission: RE | Admit: 2022-09-26 | Payer: 59 | Source: Ambulatory Visit

## 2022-09-26 ENCOUNTER — Ambulatory Visit
Admission: RE | Admit: 2022-09-26 | Discharge: 2022-09-26 | Disposition: A | Discharge status: Home / Self Care | Payer: 59 | Source: Ambulatory Visit | Attending: Internal Medicine | Admitting: Internal Medicine

## 2022-09-26 DIAGNOSIS — R928 Other abnormal and inconclusive findings on diagnostic imaging of breast: Secondary | ICD-10-CM

## 2022-10-06 ENCOUNTER — Ambulatory Visit
Admission: RE | Admit: 2022-10-06 | Discharge: 2022-10-06 | Disposition: A | Discharge status: Home / Self Care | Payer: 59 | Source: Ambulatory Visit | Attending: Internal Medicine | Admitting: Internal Medicine

## 2022-10-06 DIAGNOSIS — R928 Other abnormal and inconclusive findings on diagnostic imaging of breast: Secondary | ICD-10-CM

## 2022-10-06 HISTORY — PX: BREAST BIOPSY: SHX20

## 2022-11-02 ENCOUNTER — Ambulatory Visit: Payer: Self-pay | Admitting: General Surgery

## 2022-11-02 DIAGNOSIS — N6092 Unspecified benign mammary dysplasia of left breast: Secondary | ICD-10-CM

## 2022-11-04 ENCOUNTER — Encounter: Payer: Self-pay | Admitting: Internal Medicine

## 2022-11-04 DIAGNOSIS — N6099 Unspecified benign mammary dysplasia of unspecified breast: Secondary | ICD-10-CM | POA: Insufficient documentation

## 2022-11-06 ENCOUNTER — Telehealth: Payer: Self-pay | Admitting: Hematology and Oncology

## 2022-11-06 NOTE — Telephone Encounter (Signed)
Scheduled appointment per referral. Patient is aware of appointment date and time. Patient is aware to arrive 15 mins prior to appointment time and to bring updated insurance cards. Patient is aware of location.   

## 2022-11-08 ENCOUNTER — Other Ambulatory Visit: Payer: Self-pay | Admitting: General Surgery

## 2022-11-08 DIAGNOSIS — N6092 Unspecified benign mammary dysplasia of left breast: Secondary | ICD-10-CM

## 2022-11-27 ENCOUNTER — Ambulatory Visit (INDEPENDENT_AMBULATORY_CARE_PROVIDER_SITE_OTHER): Payer: 59 | Admitting: Internal Medicine

## 2022-11-27 ENCOUNTER — Ambulatory Visit (INDEPENDENT_AMBULATORY_CARE_PROVIDER_SITE_OTHER): Payer: 59

## 2022-11-27 ENCOUNTER — Encounter: Payer: Self-pay | Admitting: Internal Medicine

## 2022-11-27 VITALS — BP 130/80 | HR 71 | Temp 98.0°F | Resp 15 | Ht 59.0 in | Wt 168.4 lb

## 2022-11-27 DIAGNOSIS — M25562 Pain in left knee: Secondary | ICD-10-CM

## 2022-11-27 DIAGNOSIS — Z01818 Encounter for other preprocedural examination: Secondary | ICD-10-CM

## 2022-11-27 DIAGNOSIS — E7800 Pure hypercholesterolemia, unspecified: Secondary | ICD-10-CM | POA: Diagnosis not present

## 2022-11-27 DIAGNOSIS — R059 Cough, unspecified: Secondary | ICD-10-CM | POA: Diagnosis not present

## 2022-11-27 DIAGNOSIS — Z1211 Encounter for screening for malignant neoplasm of colon: Secondary | ICD-10-CM

## 2022-11-27 DIAGNOSIS — Z0000 Encounter for general adult medical examination without abnormal findings: Secondary | ICD-10-CM

## 2022-11-27 DIAGNOSIS — N6099 Unspecified benign mammary dysplasia of unspecified breast: Secondary | ICD-10-CM

## 2022-11-27 DIAGNOSIS — G4733 Obstructive sleep apnea (adult) (pediatric): Secondary | ICD-10-CM | POA: Diagnosis not present

## 2022-11-27 LAB — LIPID PANEL
Cholesterol: 168 mg/dL (ref 0–200)
HDL: 50.5 mg/dL (ref >39.00)
LDL Cholesterol: 100 mg/dL — ABNORMAL HIGH (ref 0–99)
NonHDL: 117.26
Total CHOL/HDL Ratio: 3
Triglycerides: 85 mg/dL (ref 0.0–149.0)
VLDL: 17 mg/dL (ref 0.0–40.0)

## 2022-11-27 LAB — CBC WITH DIFFERENTIAL/PLATELET
Basophils Absolute: 0 10*3/uL (ref 0.0–0.1)
Basophils Relative: 0.4 % (ref 0.0–3.0)
Eosinophils Absolute: 0.3 10*3/uL (ref 0.0–0.7)
Eosinophils Relative: 4.6 % (ref 0.0–5.0)
HCT: 43.8 % (ref 36.0–46.0)
Hemoglobin: 14.3 g/dL (ref 12.0–15.0)
Lymphocytes Relative: 35.4 % (ref 12.0–46.0)
Lymphs Abs: 2.1 10*3/uL (ref 0.7–4.0)
MCHC: 32.5 g/dL (ref 30.0–36.0)
MCV: 89.1 fl (ref 78.0–100.0)
Monocytes Absolute: 0.5 10*3/uL (ref 0.1–1.0)
Monocytes Relative: 8.3 % (ref 3.0–12.0)
Neutro Abs: 3.1 10*3/uL (ref 1.4–7.7)
Neutrophils Relative %: 51.3 % (ref 43.0–77.0)
Platelets: 306 10*3/uL (ref 150.0–400.0)
RBC: 4.92 Mil/uL (ref 3.87–5.11)
RDW: 14.4 % (ref 11.5–15.5)
WBC: 6 10*3/uL (ref 4.0–10.5)

## 2022-11-27 LAB — BASIC METABOLIC PANEL
BUN: 12 mg/dL (ref 6–23)
CO2: 27 mEq/L (ref 19–32)
Calcium: 9.3 mg/dL (ref 8.4–10.5)
Chloride: 106 mEq/L (ref 96–112)
Creatinine, Ser: 0.51 mg/dL (ref 0.40–1.20)
GFR: 102.42 mL/min (ref >60.00)
Glucose, Bld: 77 mg/dL (ref 70–99)
Potassium: 4.2 mEq/L (ref 3.5–5.1)
Sodium: 141 mEq/L (ref 135–145)

## 2022-11-27 LAB — HEPATIC FUNCTION PANEL
ALT: 16 U/L (ref 0–35)
AST: 17 U/L (ref 0–37)
Albumin: 4.1 g/dL (ref 3.5–5.2)
Alkaline Phosphatase: 53 U/L (ref 39–117)
Bilirubin, Direct: 0.1 mg/dL (ref 0.0–0.3)
Total Bilirubin: 0.4 mg/dL (ref 0.2–1.2)
Total Protein: 7.1 g/dL (ref 6.0–8.3)

## 2022-11-27 LAB — TSH: TSH: 1.72 u[IU]/mL (ref 0.35–5.50)

## 2022-11-27 MED ORDER — BENZONATATE 100 MG PO CAPS: 100.0000 mg | ORAL_CAPSULE | Freq: Three times a day (TID) | ORAL | 0 refills | Status: DC | PRN

## 2022-11-27 NOTE — Assessment & Plan Note (Signed)
Seeing pulmonary.  CPAP.

## 2022-11-27 NOTE — Progress Notes (Unsigned)
Subjective:    Patient ID: Alison Rodriguez, female    DOB: [birth year], 62 y.o.   MRN: [id number]  Patient here for  Chief Complaint  Patient presents with   Annual Exam    cpe    HPI Here for a physical exam.  Seeing pulmonary - HST - sleep apnea - CPAP.  Last f/u 1024/23.  Doing well.  Headache? Recently evaluated - abnormal mammogram.  S/p biopsy - atypical ductal hyperplasia of left breast.  Saw Dr Marlou Starks 11/02/22 - recommended removal.  Also referred to oncology.  Planning for surgery 12/14/22.     Past Medical History:  Diagnosis Date   Blood in stool    H/O   Hyperlipidemia    Hypertension    Thyroid disease    Past Surgical History:  Procedure Laterality Date   BREAST BIOPSY Left 10/06/2022   MM LT BREAST BX W LOC DEV 1ST LESION IMAGE BX SPEC STEREO GUIDE 10/06/2022 [facility]   COLONOSCOPY WITH PROPOFOL N/A 02/04/2021   Procedure: COLONOSCOPY WITH PROPOFOL;  Surgeon: Lucilla Lame, MD;  Location: [facility];  Service: Endoscopy;  Laterality: N/A;  priority 4   POLYPECTOMY  02/04/2021   Procedure: POLYPECTOMY;  Surgeon: Lucilla Lame, MD;  Location: [place];  Service: Endoscopy;;   UTERINE FIBROID EMBOLIZATION     Family History  Problem Relation Age of Onset   Diabetes Mother    Diabetes Maternal Grandmother    Arthritis Maternal Grandfather    Diabetes Maternal Grandfather    Breast cancer Other        second cousins/aunt   Social History   Socioeconomic History   Marital status: Married    Spouse name: Not on file   Number of children: Not on file   Years of education: Not on file   Highest education level: Not on file  Occupational History   Not on file  Tobacco Use   Smoking status: Never    Passive exposure: Yes   Smokeless tobacco: Never  Substance and Sexual Activity   Alcohol use: No    Alcohol/week: 0.0 standard drinks of alcohol   Drug use: No   Sexual activity: Not on file  Other Topics Concern   Not on file   Social History Narrative   Not on file   Social Determinants of Health   Financial Resource Strain: Low Risk  (09/29/2021)   Overall Financial Resource Strain (CARDIA)    Difficulty of Paying Living Expenses: Not hard at all  Food Insecurity: Not on file  Transportation Needs: Not on file  Physical Activity: Not on file  Stress: Not on file  Social Connections: Not on file     Review of Systems     Objective:     BP 130/80 (BP Location: Left Arm, Patient Position: Sitting, Cuff Size: Small)   Pulse 71   Temp 98 F (36.7 C) (Temporal)   Resp 15   Ht '4\' 11"'$  (1.499 m)   Wt 168 lb 6.4 oz (76.4 kg)   LMP 06/22/2015 (Approximate)   SpO2 100%   BMI 34.01 kg/m  Wt Readings from Last 3 Encounters:  11/27/22 168 lb 6.4 oz (76.4 kg)  09/12/22 173 lb 3.2 oz (78.6 kg)  07/17/22 166 lb 6.4 oz (75.5 kg)    Physical Exam   Outpatient Encounter Medications as of 11/27/2022  Medication Sig   benzonatate (TESSALON PERLES) 100 MG capsule Take 1 capsule (100 mg total) by mouth 3 (three) times  daily as needed for cough.   meloxicam (MOBIC) 15 MG tablet Take 15 mg by mouth daily.   rosuvastatin (CRESTOR) 10 MG tablet Take 1 tablet (10 mg total) by mouth daily.   No facility-administered encounter medications on file as of 11/27/2022.     Lab Results  Component Value Date   WBC 5.3 11/08/2021   HGB 14.0 11/08/2021   HCT 44.2 11/08/2021   PLT 296.0 11/08/2021   GLUCOSE 88 07/17/2022   CHOL 158 07/17/2022   TRIG 74.0 07/17/2022   HDL 49.70 07/17/2022   LDLCALC 94 07/17/2022   ALT 17 07/17/2022   AST 15 07/17/2022   NA 140 07/17/2022   K 4.1 07/17/2022   CL 105 07/17/2022   CREATININE 0.58 07/17/2022   BUN 9 07/17/2022   CO2 27 07/17/2022   TSH 1.42 11/08/2021    MM LT BREAST BX W LOC DEV 1ST LESION IMAGE BX SPEC STEREO GUIDE  Addendum Date: 10/11/2022   ADDENDUM REPORT: 10/11/2022 16:14 ADDENDUM: Pathology revealed FIBROCYSTIC CHANGE WITH ATYPICAL DUCT HYPERPLASIA  ([place]) AND ATYPICAL LOBULAR HYPERPLASIA ([place]), COLUMNAR CELL CHANGE, AND WITH MICROCALCIFICATIONS PRESENT of the LEFT breast, upper outer (X clip). This was found to be concordant by Dr. Lovey Newcomer, with surgical consultation for excision recommended. Pathology results were discussed with the patient by telephone. The patient reported doing well after the biopsy with tenderness at the site. Post biopsy instructions and care were reviewed and questions were answered. The patient was encouraged to call [facility] for any additional concerns. Surgical consultation has been arranged with Dr. Autumn Messing at [facility] on November 02, 2022. Pathology results reported by Stacie Acres RN on 10/11/2022. Electronically Signed   By: Lovey Newcomer M.D.   On: 10/11/2022 16:14   Result Date: 10/11/2022 CLINICAL DATA:  Patient with indeterminate left breast calcifications. EXAM: LEFT BREAST STEREOTACTIC CORE NEEDLE BIOPSY COMPARISON:  Previous exam(s). FINDINGS: The patient and I discussed the procedure of stereotactic-guided biopsy including benefits and alternatives. We discussed the high likelihood of a successful procedure. We discussed the risks of the procedure including infection, bleeding, tissue injury, clip migration, and inadequate sampling. Informed written consent was given. The usual time out protocol was performed immediately prior to the procedure. Using sterile technique and 1% Lidocaine as local anesthetic, under stereotactic guidance, a 9 gauge vacuum assisted device was used to perform core needle biopsy of calcifications superior central left breast using a cranial approach. Specimen radiograph was performed showing calcifications. Specimens with calcifications are identified for pathology. Lesion quadrant: Upper outer quadrant At the conclusion of the procedure, X shaped tissue marker clip was deployed into the biopsy cavity. Follow-up 2-view mammogram was performed and  dictated separately. IMPRESSION: Stereotactic-guided biopsy of left breast calcifications. No apparent complications. Electronically Signed: By: Lovey Newcomer M.D. On: 10/06/2022 09:58  MM CLIP PLACEMENT LEFT  Result Date: 10/06/2022 CLINICAL DATA:  Status post stereo biopsy left breast calcifications. EXAM: 3D DIAGNOSTIC LEFT MAMMOGRAM POST STEREOTACTIC BIOPSY COMPARISON:  Previous exam(s). FINDINGS: 3D Mammographic images were obtained following stereotactic guided biopsy of left breast calcifications. The biopsy marking clip is in expected position at the site of biopsy. IMPRESSION: Appropriate positioning of the X shaped biopsy marking clip at the site of biopsy in the upper-outer left breast. Final Assessment: Post Procedure Mammograms for Marker Placement Electronically Signed   By: Lovey Newcomer M.D.   On: 10/06/2022 10:12      Assessment & Plan:  Atypical  ductal hyperplasia of breast  Hypercholesterolemia -     CBC with Differential/Platelet -     Hepatic function panel -     Basic metabolic panel -     Lipid panel -     TSH  Health care maintenance Assessment & Plan: Physical today 11/27/22.  PAP 10/04/20 - negative with negative HPV.  Mammogram as outlined.  Recent biopsy.  Referred to oncology.  Colonoscopy 02/04/21.  Recommended f/u in 7 years.     OSA (obstructive sleep apnea) Assessment & Plan: Seeing pulmonary.  CPAP.    Pre-op evaluation -     EKG 12-Lead  Cough, unspecified type -     DG Chest 2 View; Future  Other orders -     Benzonatate; Take 1 capsule (100 mg total) by mouth 3 (three) times daily as needed for cough.  Dispense: 21 capsule; Refill: 0     Einar Pheasant, MD

## 2022-11-27 NOTE — Assessment & Plan Note (Signed)
Physical today 11/27/22.  PAP 10/04/20 - negative with negative HPV.  Mammogram as outlined.  Recent biopsy.  Referred to oncology.  Colonoscopy 02/04/21.  Recommended f/u in 7 years.

## 2022-11-28 ENCOUNTER — Other Ambulatory Visit: Payer: Self-pay

## 2022-11-28 DIAGNOSIS — R059 Cough, unspecified: Secondary | ICD-10-CM

## 2022-11-28 MED ORDER — AZITHROMYCIN 250 MG PO TABS: ORAL_TABLET | ORAL | 0 refills | Status: AC

## 2022-11-28 NOTE — Assessment & Plan Note (Signed)
Persistent cough and throat congestion as outlined. Overall feels better.  Given persistence, check cxr.  Tessalon perles for cough.  Follow.

## 2022-11-28 NOTE — Assessment & Plan Note (Signed)
Recently evaluated - abnormal mammogram.  S/p biopsy - atypical ductal hyperplasia of left breast.  Saw Dr Marlou Starks 11/02/22 - recommended removal.  Also referred to oncology.  Planning for surgery 12/14/22.

## 2022-11-28 NOTE — Assessment & Plan Note (Signed)
Colonoscopy 02/04/21 - pathology: One 4 mm polyp in the descending colon, removed with a cold snare. Resected and retrieved. Non-bleeding internal hemorrhoids. Pathology tubular adenoma.  F/u colonoscopy 7 years (per Dr Allen Norris).

## 2022-11-28 NOTE — Assessment & Plan Note (Signed)
Continue crestor.  Low cholesterol diet and exercise. Follow lipid panel and liver function tests.   

## 2022-11-28 NOTE — Assessment & Plan Note (Signed)
Planning breast surgery end of month.  Reports no chest pain or sob.  Does have the persistent cough.  Check cxr as outlined.  EKG - SR - non specific ST/T change.  No acute ischemic changes noted.  Will need close intra op and post op monitoring of her heart rate and blood pressure to avoid extremes.

## 2022-11-28 NOTE — Assessment & Plan Note (Signed)
Left knee pain as outlined.  Desires no further intervention at this time.  Will notify me if desires further evaluation.

## 2022-12-02 ENCOUNTER — Encounter: Payer: Self-pay | Admitting: Internal Medicine

## 2022-12-04 ENCOUNTER — Inpatient Hospital Stay: Payer: 59

## 2022-12-04 ENCOUNTER — Telehealth: Payer: Self-pay

## 2022-12-04 ENCOUNTER — Encounter: Payer: Self-pay | Admitting: Hematology and Oncology

## 2022-12-04 ENCOUNTER — Inpatient Hospital Stay: Payer: 59 | Attending: Hematology and Oncology | Admitting: Hematology and Oncology

## 2022-12-04 VITALS — BP 135/86 | HR 83 | Temp 97.9°F | Resp 16 | Ht 59.0 in | Wt 168.4 lb

## 2022-12-04 DIAGNOSIS — Z780 Asymptomatic menopausal state: Secondary | ICD-10-CM | POA: Diagnosis not present

## 2022-12-04 DIAGNOSIS — N950 Postmenopausal bleeding: Secondary | ICD-10-CM | POA: Insufficient documentation

## 2022-12-04 DIAGNOSIS — Z803 Family history of malignant neoplasm of breast: Secondary | ICD-10-CM | POA: Diagnosis not present

## 2022-12-04 DIAGNOSIS — N6099 Unspecified benign mammary dysplasia of unspecified breast: Secondary | ICD-10-CM | POA: Diagnosis not present

## 2022-12-04 LAB — URINALYSIS, COMPLETE (UACMP) WITH MICROSCOPIC
Bilirubin Urine: NEGATIVE
Glucose, UA: NEGATIVE mg/dL
Ketones, ur: NEGATIVE mg/dL
Leukocytes,Ua: NEGATIVE
Nitrite: NEGATIVE
Protein, ur: NEGATIVE mg/dL
Specific Gravity, Urine: 1.02 (ref 1.005–1.030)
pH: 5 (ref 5.0–8.0)

## 2022-12-04 NOTE — Telephone Encounter (Signed)
Pt advised. Per pt - oncology recommended pt see a GYN and took the urine sample - that was all. Pt advised we are looking to work her in.  Will hold for work in.

## 2022-12-04 NOTE — Telephone Encounter (Signed)
Called and given below message. Pt verbalized understanding. 

## 2022-12-04 NOTE — Telephone Encounter (Signed)
She will need to be evaluated.  Per note, she is not having any bleeding or pain now.  Also per note, due to see oncology tomorrow.  I will need to find a work in spot for her.  Call if any change in symptoms.

## 2022-12-04 NOTE — Telephone Encounter (Signed)
Per review, she states that oncology recommended referral to gyn.  I am happy to work her in or does she prefer to see gyn.  I can refer her to gyn.  Just let me know.

## 2022-12-04 NOTE — Progress Notes (Signed)
[place] CONSULT NOTE  Patient Care Team: Einar Pheasant, MD as PCP - General (Internal Medicine)  CHIEF COMPLAINTS/PURPOSE OF CONSULTATION:  ADH  ASSESSMENT & PLAN:  This is a very pleasant 62 year old postmenopausal female patient with newly diagnosed atypical ductal hyperplasia referred to high risk breast clinic for recommendations.  We have discussed the following details about recommendations.  Pathology review: I discussed the difference between atypical ductal hyperplasia, DCIS and invasive breast cancer. I explained to her that atypical ductal hyperplasia  is characterized by a proliferation of uniform epithelial cells filling part, but not the entirety, of the involved duct. ADH is associated with an increased risk of both ipsilateral and contralateral breast cancer and thus provides evidence of underlying breast abnormalities that predispose to breast cancer.   We discussed her life time risk of breast cancer and 5 yr risk of breast cancer today which comes to 3% and 19% respectively.  2. We discussed different risk reducing strategies for future breast cancer risk. We discussed chemoprevention and lifestyle modification. We discussed role of ongoing surveillance with mammograms.  3. Lifestyle modification: We discussed different interventions exercise at least 5 days a week including both aerobic as well as weight-bearing exercises and strength training. He discussed dietary modification including increasing number of servings of fruit and vegetables as well as decreased in number of servings of meat. We discussed the importance of maintaining a good BMI and limiting alcohol intake.  4. Surveillance: Patient certainly would be a good candidate for ongoing mammograms on a yearly basis. Certainly she could benefit from a 3-D mammogram. We discussed self breast examination as well as ongoing clinical examination.   5. Chemoprevention: Patient is not interested.  6.  Followup: Patient will be seen back in 1 yr follow up.   HISTORY OF PRESENTING ILLNESS:  Alison Rodriguez 62 y.o. female is here because of ADH  This is a very pleasant 62 year old postmenopausal female patient who had screening mammogram back in October which showed left breast calcifications, biopsy showed ADH and ALH, she is scheduled for lumpectomy and is also referred to high risk breast clinic for additional recommendations.  She does not have any significant family history of breast cancer except in a second cousin.  There may be a colon cancer in one of the family members.  She denies any major health issues otherwise at baseline except for hypertension.  She has however noticed some bleeding from the vagina especially when she wipes and she was wondering if she can have a urine analysis done today.  Many years ago she had a benign breast biopsy.  She is nulliparous.  She denies taking hormone replacement therapy or birth control for a long time. Rest of the pertinent 10 point ROS reviewed and negative  REVIEW OF SYSTEMS:   Constitutional: Denies fevers, chills or abnormal night sweats Eyes: Denies blurriness of vision, double vision or watery eyes Ears, nose, mouth, throat, and face: Denies mucositis or sore throat Respiratory: Denies cough, dyspnea or wheezes Cardiovascular: Denies palpitation, chest discomfort or lower extremity swelling Gastrointestinal:  Denies nausea, heartburn or change in bowel habits Skin: Denies abnormal skin rashes Lymphatics: Denies new lymphadenopathy or easy bruising Neurological:Denies numbness, tingling or new weaknesses Behavioral/Psych: Mood is stable, no new changes  All other systems were reviewed with the patient and are negative.  MEDICAL HISTORY:  Past Medical History:  Diagnosis Date   Blood in stool    H/O   Hyperlipidemia    Hypertension  Thyroid disease     SURGICAL HISTORY: Past Surgical History:  Procedure Laterality Date    BREAST BIOPSY Left 10/06/2022   MM LT BREAST BX W LOC DEV 1ST LESION IMAGE BX SPEC STEREO GUIDE 10/06/2022 [facility]   COLONOSCOPY WITH PROPOFOL N/A 02/04/2021   Procedure: COLONOSCOPY WITH PROPOFOL;  Surgeon: Lucilla Lame, MD;  Location: [place] [facility];  Service: Endoscopy;  Laterality: N/A;  priority 4   POLYPECTOMY  02/04/2021   Procedure: POLYPECTOMY;  Surgeon: Lucilla Lame, MD;  Location: [place];  Service: Endoscopy;;   UTERINE FIBROID EMBOLIZATION      SOCIAL HISTORY: Social History   Socioeconomic History   Marital status: Married    Spouse name: Not on file   Number of children: Not on file   Years of education: Not on file   Highest education level: Not on file  Occupational History   Not on file  Tobacco Use   Smoking status: Never    Passive exposure: Yes   Smokeless tobacco: Never  Substance and Sexual Activity   Alcohol use: No    Alcohol/week: 0.0 standard drinks of alcohol   Drug use: No   Sexual activity: Not on file  Other Topics Concern   Not on file  Social History Narrative   Not on file   Social Determinants of Health   Financial Resource Strain: Low Risk  (09/29/2021)   Overall Financial Resource Strain (CARDIA)    Difficulty of Paying Living Expenses: Not hard at all  Food Insecurity: Not on file  Transportation Needs: Not on file  Physical Activity: Not on file  Stress: Not on file  Social Connections: Not on file  Intimate Partner Violence: Not on file    FAMILY HISTORY: Family History  Problem Relation Age of Onset   Diabetes Mother    Diabetes Maternal Grandmother    Arthritis Maternal Grandfather    Diabetes Maternal Grandfather    Breast cancer Other        second cousins/aunt    ALLERGIES:  has No Known Allergies.  MEDICATIONS:  Current Outpatient Medications  Medication Sig Dispense Refill   benzonatate (TESSALON PERLES) 100 MG capsule Take 1 capsule (100 mg total) by mouth 3 (three) times daily as  needed for cough. 21 capsule 0   meloxicam (MOBIC) 15 MG tablet Take 15 mg by mouth daily.     rosuvastatin (CRESTOR) 10 MG tablet Take 1 tablet (10 mg total) by mouth daily. 30 tablet 11   No current facility-administered medications for this visit.     PHYSICAL EXAMINATION: ECOG PERFORMANCE STATUS: 0 - Asymptomatic  Vitals:   12/04/22 1024  BP: 135/86  Pulse: 83  Resp: 16  Temp: 97.9 F (36.6 C)  SpO2: 99%   Filed Weights   12/04/22 1024  Weight: 168 lb 6.4 oz (76.4 kg)    GENERAL:alert, no distress and comfortable Rest of the physical exam deferred in lieu of counseling  LABORATORY DATA:  I have reviewed the data as listed Lab Results  Component Value Date   WBC 6.0 11/27/2022   HGB 14.3 11/27/2022   HCT 43.8 11/27/2022   MCV 89.1 11/27/2022   PLT 306.0 11/27/2022     Chemistry      Component Value Date/Time   NA 141 11/27/2022 0835   NA 139 12/08/2014 1713   K 4.2 11/27/2022 0835   K 4.1 12/08/2014 1713   CL 106 11/27/2022 0835   CL 104 12/08/2014 1713  CO2 27 11/27/2022 0835   CO2 28 12/08/2014 1713   BUN 12 11/27/2022 0835   BUN 11 12/08/2014 1713   CREATININE 0.51 11/27/2022 0835   CREATININE 0.63 12/08/2014 1713      Component Value Date/Time   CALCIUM 9.3 11/27/2022 0835   CALCIUM 8.8 12/08/2014 1713   ALKPHOS 53 11/27/2022 0835   ALKPHOS 68 12/08/2014 1713   AST 17 11/27/2022 0835   AST 25 12/08/2014 1713   ALT 16 11/27/2022 0835   ALT 28 12/08/2014 1713   BILITOT 0.4 11/27/2022 0835   BILITOT 0.3 12/08/2014 1713       RADIOGRAPHIC STUDIES: I have personally reviewed the radiological images as listed and agreed with the findings in the report. DG Chest 2 View  Result Date: 11/27/2022 CLINICAL DATA:  62 year old female with persisting cough EXAM: CHEST - 2 VIEW COMPARISON:  12/08/2014, CT chest 12/13/2015 FINDINGS: Cardiomediastinal silhouette unchanged in size and contour. No evidence of central vascular congestion. No interlobular  septal thickening. Diffuse reticular opacities which were not present previously with central bronchial wall thickening. No airspace opacity. No pneumothorax or pleural effusion. . No acute displaced fracture. Degenerative changes of the spine. IMPRESSION: Diffuse reticular opacities with some bronchial wall thickening may reflect sequela of atypical infection/bronchitis. Electronically Signed   By: Corrie Mckusick D.O.   On: 11/27/2022 09:51    All questions were answered. The patient knows to call the clinic with any problems, questions or concerns. I spent 45 minutes in the care of this patient including H and P, review of records, counseling and coordination of care.     Benay Pike, MD 12/04/2022 10:26 AM

## 2022-12-04 NOTE — Telephone Encounter (Signed)
-----  Message from Benay Pike, MD sent at 12/04/2022 12:25 PM EST ----- Please let her know there is no evidence of UTI from labs today. She may need to talk to her PCP about postmenopausal bleeding and will have to see a gynecologist.

## 2022-12-05 NOTE — Telephone Encounter (Signed)
Hold for work in appt

## 2022-12-05 NOTE — Telephone Encounter (Signed)
S/w pt - would prefer to see you first, and if you feel she needs gyn, then she will go.

## 2022-12-06 NOTE — Telephone Encounter (Signed)
Pt on work in [employer]

## 2022-12-07 ENCOUNTER — Other Ambulatory Visit: Payer: Self-pay

## 2022-12-07 ENCOUNTER — Telehealth: Payer: Self-pay | Admitting: Internal Medicine

## 2022-12-07 ENCOUNTER — Encounter (HOSPITAL_BASED_OUTPATIENT_CLINIC_OR_DEPARTMENT_OTHER): Payer: Self-pay | Admitting: General Surgery

## 2022-12-07 NOTE — Progress Notes (Signed)
Patient states has had a persistant cough since Christmas. Went to PCP last week and had CXR, diagnosed with bronchitis, given a Zpak. Denies fever and improved cough. Having repeat CXR tomorrow 12/08/2022.  Per Dr. Fransisco Beau may proceed with planned surgery on 12/14/2022.

## 2022-12-07 NOTE — Telephone Encounter (Signed)
Pt called in that she would like to speak to [facility]. Did not specific concerning what reason.

## 2022-12-07 NOTE — Telephone Encounter (Signed)
Pt scheduled for 7am tomorrow

## 2022-12-08 ENCOUNTER — Ambulatory Visit (INDEPENDENT_AMBULATORY_CARE_PROVIDER_SITE_OTHER): Payer: 59 | Admitting: Internal Medicine

## 2022-12-08 ENCOUNTER — Encounter: Payer: Self-pay | Admitting: Internal Medicine

## 2022-12-08 ENCOUNTER — Ambulatory Visit (INDEPENDENT_AMBULATORY_CARE_PROVIDER_SITE_OTHER): Payer: 59

## 2022-12-08 ENCOUNTER — Other Ambulatory Visit: Payer: 59

## 2022-12-08 VITALS — BP 124/72 | HR 86 | Temp 97.9°F | Resp 16 | Ht 59.0 in | Wt 166.2 lb

## 2022-12-08 DIAGNOSIS — Z1211 Encounter for screening for malignant neoplasm of colon: Secondary | ICD-10-CM

## 2022-12-08 DIAGNOSIS — E7800 Pure hypercholesterolemia, unspecified: Secondary | ICD-10-CM

## 2022-12-08 DIAGNOSIS — N6099 Unspecified benign mammary dysplasia of unspecified breast: Secondary | ICD-10-CM

## 2022-12-08 DIAGNOSIS — R059 Cough, unspecified: Secondary | ICD-10-CM

## 2022-12-08 DIAGNOSIS — G4733 Obstructive sleep apnea (adult) (pediatric): Secondary | ICD-10-CM

## 2022-12-08 DIAGNOSIS — N950 Postmenopausal bleeding: Secondary | ICD-10-CM

## 2022-12-08 NOTE — Progress Notes (Signed)
Subjective:    Patient ID: Alison Rodriguez, female    DOB: 12-25-1960, 62 y.o.   MRN: [id number]  Patient here for  Chief Complaint  Patient presents with   Vaginal Bleeding    HPI Here for work in appt.  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  She noticed cramping and vaginal bleeding starting one week go.  Has continued to have bleeding/spotting until yesterday.  No bleeding yesterday.  No cramping now.  Cough is better.  No sob.  No chest pain.  Due to have f/u cxr today.  Does report having some right hip/groin pain.  Started when she noticed the vaginal bleeding.  Aggravated - getting in and out of the car.  Feels like has a brace on her leg.  No injury or trauma.     Past Medical History:  Diagnosis Date   Blood in stool    H/O   Hyperlipidemia    Hypertension    Sleep apnea    Thyroid disease    Past Surgical History:  Procedure Laterality Date   BREAST BIOPSY Left 10/06/2022   MM LT BREAST BX W LOC DEV 1ST LESION IMAGE BX SPEC STEREO GUIDE 10/06/2022 [facility]   COLONOSCOPY WITH PROPOFOL N/A 02/04/2021   Procedure: COLONOSCOPY WITH PROPOFOL;  Surgeon: Lucilla Lame, MD;  Location: [place];  Service: Endoscopy;  Laterality: N/A;  priority 4   POLYPECTOMY  02/04/2021   Procedure: POLYPECTOMY;  Surgeon: Lucilla Lame, MD;  Location: [place];  Service: Endoscopy;;   UTERINE FIBROID EMBOLIZATION     Family History  Problem Relation Age of Onset   Diabetes Mother    Diabetes Maternal Grandmother    Arthritis Maternal Grandfather    Diabetes Maternal Grandfather    Breast cancer Other        second cousins/aunt   Social History   Socioeconomic History   Marital status: Married    Spouse name: Not on file   Number of children: Not on file   Years of education: Not on file   Highest education level: Not on file  Occupational History   Not on file  Tobacco Use   Smoking status: Never    Passive exposure:  Yes   Smokeless tobacco: Never  Substance and Sexual Activity   Alcohol use: No    Alcohol/week: 0.0 standard drinks of alcohol   Drug use: No   Sexual activity: Not on file  Other Topics Concern   Not on file  Social History Narrative   Not on file   Social Determinants of Health   Financial Resource Strain: Low Risk  (09/29/2021)   Overall Financial Resource Strain (CARDIA)    Difficulty of Paying Living Expenses: Not hard at all  Food Insecurity: Not on file  Transportation Needs: Not on file  Physical Activity: Not on file  Stress: Not on file  Social Connections: Not on file     Review of Systems  Constitutional:  Negative for appetite change and unexpected weight change.  HENT:  Negative for congestion and sinus pressure.   Respiratory:  Negative for chest tightness and shortness of breath.        Cough and congestion better.   Cardiovascular:  Negative for chest pain and palpitations.       No increased swelling.   Gastrointestinal:  Negative for abdominal pain, diarrhea, nausea and vomiting.  Genitourinary:  Negative for difficulty urinating and dysuria.  Postmenopausal bleeding.   Musculoskeletal:  Negative for joint swelling and myalgias.  Skin:  Negative for color change and rash.  Neurological:  Negative for dizziness and headaches.  Psychiatric/Behavioral:  Negative for agitation and dysphoric mood.        Objective:     BP 124/72   Pulse 86   Temp 97.9 F (36.6 C)   Resp 16   Ht '4\' 11"'$  (1.499 m)   Wt 166 lb 3.2 oz (75.4 kg)   LMP 06/22/2015 (Approximate)   SpO2 98%   BMI 33.57 kg/m  Wt Readings from Last 3 Encounters:  12/08/22 166 lb 3.2 oz (75.4 kg)  12/04/22 168 lb 6.4 oz (76.4 kg)  11/27/22 168 lb 6.4 oz (76.4 kg)    Physical Exam Vitals reviewed.  Constitutional:      General: She is not in acute distress.    Appearance: Normal appearance.  HENT:     Head: Normocephalic and atraumatic.     Right Ear: External ear normal.      Left Ear: External ear normal.  Eyes:     General: No scleral icterus.       Right eye: No discharge.        Left eye: No discharge.     Conjunctiva/sclera: Conjunctivae normal.  Neck:     Thyroid: No thyromegaly.  Cardiovascular:     Rate and Rhythm: Normal rate and regular rhythm.  Pulmonary:     Effort: No respiratory distress.     Breath sounds: Normal breath sounds. No wheezing.  Abdominal:     General: Bowel sounds are normal.     Palpations: Abdomen is soft.     Tenderness: There is no abdominal tenderness.  Genitourinary:    Comments: Normal external genitalia.  Vaginal vault without lesions.  Could not appreciate any adnexal masses or tenderness.   Musculoskeletal:        General: No swelling or tenderness.     Cervical back: Neck supple. No tenderness.  Lymphadenopathy:     Cervical: No cervical adenopathy.  Skin:    Findings: No erythema or rash.  Neurological:     Mental Status: She is alert.  Psychiatric:        Mood and Affect: Mood normal.        Behavior: Behavior normal.      Outpatient Encounter Medications as of 12/08/2022  Medication Sig   meloxicam (MOBIC) 15 MG tablet Take 15 mg by mouth daily.   rosuvastatin (CRESTOR) 10 MG tablet Take 1 tablet (10 mg total) by mouth daily.   [DISCONTINUED] benzonatate (TESSALON PERLES) 100 MG capsule Take 1 capsule (100 mg total) by mouth 3 (three) times daily as needed for cough.   No facility-administered encounter medications on file as of 12/08/2022.     Lab Results  Component Value Date   WBC 6.0 11/27/2022   HGB 14.3 11/27/2022   HCT 43.8 11/27/2022   PLT 306.0 11/27/2022   GLUCOSE 77 11/27/2022   CHOL 168 11/27/2022   TRIG 85.0 11/27/2022   HDL 50.50 11/27/2022   LDLCALC 100 (H) 11/27/2022   ALT 16 11/27/2022   AST 17 11/27/2022   NA 141 11/27/2022   K 4.2 11/27/2022   CL 106 11/27/2022   CREATININE 0.51 11/27/2022   BUN 12 11/27/2022   CO2 27 11/27/2022   TSH 1.72 11/27/2022    MM LT  BREAST BX W LOC DEV 1ST LESION IMAGE BX SPEC STEREO GUIDE  Addendum Date: 10/11/2022  ADDENDUM REPORT: 10/11/2022 16:14 ADDENDUM: Pathology revealed FIBROCYSTIC CHANGE WITH ATYPICAL DUCT HYPERPLASIA (ADH) AND ATYPICAL LOBULAR HYPERPLASIA (ALH), COLUMNAR CELL CHANGE, AND WITH MICROCALCIFICATIONS PRESENT of the LEFT breast, upper outer (X clip). This was found to be concordant by Dr. Lovey Newcomer, with surgical consultation for excision recommended. Pathology results were discussed with the patient by telephone. The patient reported doing well after the biopsy with tenderness at the site. Post biopsy instructions and care were reviewed and questions were answered. The patient was encouraged to call [facility] for any additional concerns. Surgical consultation has been arranged with Dr. Autumn Messing at [facility] on November 02, 2022. Pathology results reported by Stacie Acres RN on 10/11/2022. Electronically Signed   By: Lovey Newcomer M.D.   On: 10/11/2022 16:14   Result Date: 10/11/2022 CLINICAL DATA:  Patient with indeterminate left breast calcifications. EXAM: LEFT BREAST STEREOTACTIC CORE NEEDLE BIOPSY COMPARISON:  Previous exam(s). FINDINGS: The patient and I discussed the procedure of stereotactic-guided biopsy including benefits and alternatives. We discussed the high likelihood of a successful procedure. We discussed the risks of the procedure including infection, bleeding, tissue injury, clip migration, and inadequate sampling. Informed written consent was given. The usual time out protocol was performed immediately prior to the procedure. Using sterile technique and 1% Lidocaine as local anesthetic, under stereotactic guidance, a 9 gauge vacuum assisted device was used to perform core needle biopsy of calcifications superior central left breast using a cranial approach. Specimen radiograph was performed showing calcifications. Specimens with calcifications are  identified for pathology. Lesion quadrant: Upper outer quadrant At the conclusion of the procedure, X shaped tissue marker clip was deployed into the biopsy cavity. Follow-up 2-view mammogram was performed and dictated separately. IMPRESSION: Stereotactic-guided biopsy of left breast calcifications. No apparent complications. Electronically Signed: By: Lovey Newcomer M.D. On: 10/06/2022 09:58  MM CLIP PLACEMENT LEFT  Result Date: 10/06/2022 CLINICAL DATA:  Status post stereo biopsy left breast calcifications. EXAM: 3D DIAGNOSTIC LEFT MAMMOGRAM POST STEREOTACTIC BIOPSY COMPARISON:  Previous exam(s). FINDINGS: 3D Mammographic images were obtained following stereotactic guided biopsy of left breast calcifications. The biopsy marking clip is in expected position at the site of biopsy. IMPRESSION: Appropriate positioning of the X shaped biopsy marking clip at the site of biopsy in the upper-outer left breast. Final Assessment: Post Procedure Mammograms for Marker Placement Electronically Signed   By: Lovey Newcomer M.D.   On: 10/06/2022 10:12      Assessment & Plan:  Postmenopausal bleeding Assessment & Plan: Had bleeding as outlined.  Exam as outlined.  Refer to GYN for further evaluation.  Request to see Dr Freda Munro.   Orders: -     Ambulatory referral to Gynecology  Atypical ductal hyperplasia of breast Assessment & Plan:  Recently evaluated - abnormal mammogram.  S/p biopsy - atypical ductal hyperplasia of left breast.  Saw Dr Marlou Starks 11/02/22 - recommended removal.  Also referred to oncology.  Planning for surgery 12/14/22.    Colon cancer screening Assessment & Plan: Colonoscopy 02/04/21 - pathology: One 4 mm polyp in the descending colon, removed with a cold snare. Resected and retrieved. Non-bleeding internal hemorrhoids. Pathology tubular adenoma.  F/u colonoscopy 7 years (per Dr Allen Norris).    Cough, unspecified type Assessment & Plan: Recent infection.  Cough and congestion improved.  Recheck  cxr today to confirm clear.  Breathing overall stable.     Hypercholesterolemia Assessment & Plan: Continue crestor.  Low cholesterol diet and exercise.  Follow lipid panel and liver function tests.    OSA (obstructive sleep apnea) Assessment & Plan: Seeing pulmonary.  CPAP.       Einar Pheasant, MD

## 2022-12-11 ENCOUNTER — Telehealth: Payer: Self-pay | Admitting: Internal Medicine

## 2022-12-11 ENCOUNTER — Encounter: Payer: Self-pay | Admitting: Internal Medicine

## 2022-12-11 NOTE — Assessment & Plan Note (Signed)
Recent infection.  Cough and congestion improved.  Recheck cxr today to confirm clear.  Breathing overall stable.

## 2022-12-11 NOTE — Telephone Encounter (Signed)
Patient called stating that she found a GYN she would like to see but the doctor is out of [place] until 2/12. Patient is wondering if it is ok to wait until then to be seen. Also, do I need to place new referral for her?   [facility] for [facility] [place]. DR Verita Schneiders [place] [phone]

## 2022-12-11 NOTE — Assessment & Plan Note (Signed)
Colonoscopy 02/04/21 - pathology: One 4 mm polyp in the descending colon, removed with a cold snare. Resected and retrieved. Non-bleeding internal hemorrhoids. Pathology tubular adenoma.  F/u colonoscopy 7 years (per Dr Allen Norris).

## 2022-12-11 NOTE — Assessment & Plan Note (Signed)
Seeing pulmonary.  CPAP.

## 2022-12-11 NOTE — Telephone Encounter (Signed)
Ok to place order for the referral.  I have already placed order for the referral to Dr Ouida Sills - she had requested previously.  Will need to notify [place] of change.  Any problems or bleeding before appt, let me know.

## 2022-12-11 NOTE — Assessment & Plan Note (Signed)
Continue crestor.  Low cholesterol diet and exercise. Follow lipid panel and liver function tests.   

## 2022-12-11 NOTE — Telephone Encounter (Signed)
Pt returning a call.

## 2022-12-11 NOTE — Assessment & Plan Note (Signed)
Recently evaluated - abnormal mammogram.  S/p biopsy - atypical ductal hyperplasia of left breast.  Saw Dr Marlou Starks 11/02/22 - recommended removal.  Also referred to oncology.  Planning for surgery 12/14/22.

## 2022-12-11 NOTE — Assessment & Plan Note (Signed)
Had bleeding as outlined.  Exam as outlined.  Refer to GYN for further evaluation.  Request to see Dr Freda Munro.

## 2022-12-12 ENCOUNTER — Ambulatory Visit (INDEPENDENT_AMBULATORY_CARE_PROVIDER_SITE_OTHER): Payer: 59 | Admitting: Adult Health

## 2022-12-12 ENCOUNTER — Encounter: Payer: Self-pay | Admitting: Adult Health

## 2022-12-12 VITALS — BP 112/80 | HR 68 | Temp 98.3°F | Ht 59.0 in | Wt 166.8 lb

## 2022-12-12 DIAGNOSIS — G4733 Obstructive sleep apnea (adult) (pediatric): Secondary | ICD-10-CM | POA: Diagnosis not present

## 2022-12-12 DIAGNOSIS — Z01811 Encounter for preprocedural respiratory examination: Secondary | ICD-10-CM

## 2022-12-12 DIAGNOSIS — J40 Bronchitis, not specified as acute or chronic: Secondary | ICD-10-CM | POA: Diagnosis not present

## 2022-12-12 DIAGNOSIS — R9389 Abnormal findings on diagnostic imaging of other specified body structures: Secondary | ICD-10-CM | POA: Diagnosis not present

## 2022-12-12 MED ORDER — PREDNISONE 20 MG PO TABS: ORAL_TABLET | ORAL | 0 refills | Status: DC

## 2022-12-12 NOTE — Progress Notes (Signed)
$'@Patient'L$  ID: Alison Rodriguez, female    DOB: 01-15-1961, 62 y.o.   MRN: [id number]  Chief Complaint  Patient presents with   Follow-up    Referring provider: Einar Pheasant, MD  HPI: 62 year old female seen for sleep consult June 06, 2022 for snoring and daytime sleepiness found to have moderate obstructive sleep apnea Medical history significant for hyperlipidemia  TEST/EVENTS :   07/01/2022 HST  shows moderate sleep apnea with AHI at 17.3/hour and SPO2 low at 83%.     12/12/2022 Follow up: Cough  Patient presents for work in visit.  Patient was recently treated for an acute bronchitis over the last 4 weeks.  Patient says she got cold-like symptoms at the holidays.  She had a lingering cough that would not go away.  Patient is a never smoker.  Does have secondhand exposure from several family members.  She has no known history of asthma or COPD.  She has had no previous pneumonia.  Patient works in [employer].  She has no basement or hot tub.  No birds or chickens.  She has no pets. She was seen by her primary care provider, initial chest x-ray shows diffuse reticular opacities with some bronchial wall thickening may reflect bronchitis.  She was given a Z-Pak.  Patient says her cough and congestion have improved.  She is definitely better than she was.  She has some mild lingering minimally productive cough.  Has an occasional wheeze. Follow-up chest x-ray on December 08, 2022 showed persistent increased interstitial markings. Patient denies any hemoptysis, discolored mucus, fever, rash, joint pain, swelling.  Patient was recently diagnosed with sleep apnea she was started on CPAP.  Does feel that it is helping.  She has decreased daytime sleepiness.  Patient is wearing her CPAP every single night.  Gets in about 5 to 6 hours of sleep.  CPAP download shows excellent compliance with 100% usage.  Daily average usage at 5.5 hours.  Patient is on auto CPAP 5 to 15 cm H2O.  AHI is 1.1/hour and  daily average pressure at 10.9 cm H2O.  Patient is going for a lumpectomy later this week.   No Known Allergies  Immunization History  Administered Date(s) Administered   Influenza Split 10/26/2013   Influenza,inj,Quad PF,6+ Mos 09/11/2017, 10/04/2020   Influenza-Unspecified 09/14/2014   Janssen (J&J) SARS-COV-2 Vaccination 01/31/2020   PFIZER Comirnaty(Gray Top)Covid-19 Tri-Sucrose Vaccine 04/02/2021   PFIZER(Purple Top)SARS-COV-2 Vaccination 06/09/2020    Past Medical History:  Diagnosis Date   Blood in stool    H/O   Hyperlipidemia    Hypertension    Sleep apnea    Thyroid disease     Tobacco History: Social History   Tobacco Use  Smoking Status Never   Passive exposure: Yes  Smokeless Tobacco Never   Counseling given: Not Answered   Outpatient Medications Prior to Visit  Medication Sig Dispense Refill   rosuvastatin (CRESTOR) 10 MG tablet Take 1 tablet (10 mg total) by mouth daily. 30 tablet 11   meloxicam (MOBIC) 15 MG tablet Take 15 mg by mouth daily. (Patient not taking: Reported on 12/12/2022)     No facility-administered medications prior to visit.     Review of Systems:   Constitutional:   No  weight loss, night sweats,  Fevers, chills, fatigue, or  lassitude.  HEENT:   No headaches,  Difficulty swallowing,  Tooth/dental problems, or  Sore throat,                No sneezing,  itching, ear ache, nasal congestion, post nasal drip,   CV:  No chest pain,  Orthopnea, PND, swelling in lower extremities, anasarca, dizziness, palpitations, syncope.   GI  No heartburn, indigestion, abdominal pain, nausea, vomiting, diarrhea, change in bowel habits, loss of appetite, bloody stools.   Resp:  No chest wall deformity  Skin: no rash or lesions.  GU: no dysuria, change in color of urine, no urgency or frequency.  No flank pain, no hematuria   MS:  No joint pain or swelling.  No decreased range of motion.  No back pain.    Physical Exam  BP 112/80    Pulse 68   Temp 98.3 F (36.8 C) (Oral)   Ht '4\' 11"'$  (1.499 m)   Wt 166 lb 12.8 oz (75.7 kg)   LMP 06/22/2015 (Approximate)   SpO2 100%   BMI 33.69 kg/m   GEN: A/Ox3; pleasant , NAD, well nourished    HEENT:  [place]/AT,  NOSE-clear, THROAT-clear, no lesions, no postnasal drip or exudate noted.   NECK:  Supple w/ fair ROM; no JVD; normal carotid impulses w/o bruits; no thyromegaly or nodules palpated; no lymphadenopathy.    RESP  Clear  P & A; w/o, wheezes/ rales/ or rhonchi. no accessory muscle use, no dullness to percussion  CARD:  RRR, no m/r/g, no peripheral edema, pulses intact, no cyanosis or clubbing.  GI:   Soft & nt; nml bowel sounds; no organomegaly or masses detected.   Musco: Warm bil, no deformities or joint swelling noted.   Neuro: alert, no focal deficits noted.    Skin: Warm, no lesions or rashes    Lab Results:  CBC    Component Value Date/Time   WBC 6.0 11/27/2022 0835   RBC 4.92 11/27/2022 0835   HGB 14.3 11/27/2022 0835   HGB 15.1 12/08/2014 1713   HCT 43.8 11/27/2022 0835   HCT 47.4 (H) 12/08/2014 1713   PLT 306.0 11/27/2022 0835   PLT 289 12/08/2014 1713   MCV 89.1 11/27/2022 0835   MCV 90 12/08/2014 1713   MCH 28.5 12/08/2014 1713   MCHC 32.5 11/27/2022 0835   RDW 14.4 11/27/2022 0835   RDW 13.3 12/08/2014 1713   LYMPHSABS 2.1 11/27/2022 0835   MONOABS 0.5 11/27/2022 0835   EOSABS 0.3 11/27/2022 0835   BASOSABS 0.0 11/27/2022 0835    BMET    Component Value Date/Time   NA 141 11/27/2022 0835   NA 139 12/08/2014 1713   K 4.2 11/27/2022 0835   K 4.1 12/08/2014 1713   CL 106 11/27/2022 0835   CL 104 12/08/2014 1713   CO2 27 11/27/2022 0835   CO2 28 12/08/2014 1713   GLUCOSE 77 11/27/2022 0835   GLUCOSE 81 12/08/2014 1713   BUN 12 11/27/2022 0835   BUN 11 12/08/2014 1713   CREATININE 0.51 11/27/2022 0835   CREATININE 0.63 12/08/2014 1713   CALCIUM 9.3 11/27/2022 0835   CALCIUM 8.8 12/08/2014 1713   GFRNONAA >60 12/08/2014 1713    GFRAA >60 12/08/2014 1713    BNP No results found for: "BNP"  ProBNP No results found for: "PROBNP"  Imaging: DG Chest 2 View  Result Date: 12/08/2022 CLINICAL DATA:  Persistent cough EXAM: CHEST - 2 VIEW COMPARISON:  November 27, 2022 FINDINGS: The heart size and mediastinal contours are within normal limits. Persistent mild increased pulmonary interstitium is identified bilaterally. No focal consolidation or pleural effusion is identified. Scoliosis of spine. IMPRESSION: Persistent mild increased pulmonary interstitium bilaterally. This can  be seen as sequela of atypical infection or bronchitis. Electronically Signed   By: Abelardo Diesel M.D.   On: 12/08/2022 08:23   DG Chest 2 View  Result Date: 11/27/2022 CLINICAL DATA:  62 year old female with persisting cough EXAM: CHEST - 2 VIEW COMPARISON:  12/08/2014, CT chest 12/13/2015 FINDINGS: Cardiomediastinal silhouette unchanged in size and contour. No evidence of central vascular congestion. No interlobular septal thickening. Diffuse reticular opacities which were not present previously with central bronchial wall thickening. No airspace opacity. No pneumothorax or pleural effusion. . No acute displaced fracture. Degenerative changes of the spine. IMPRESSION: Diffuse reticular opacities with some bronchial wall thickening may reflect sequela of atypical infection/bronchitis. Electronically Signed   By: Corrie Mckusick D.O.   On: 11/27/2022 09:51          No data to display          No results found for: "NITRICOXIDE"      Assessment & Plan:   Bronchitis Slow to resolve bronchitis.  Does appear to be improving.  Chest x-ray shows increased interstitial markings. Could have a post viral inflammatory process.  Cough does seem to be improved.  Will set up for a follow-up CT scan high-resolution to further evaluate lung parenchyma.  Give short course of steroids with prednisone 40 mg daily.  Patient wants to wait to start steroids until  after her breast surgery.  This does seem reasonable. Control for triggers .   Plan  Patient Instructions  Prednisone '40mg'$  daily for 5 days. Take with food.  Delsym 2 tsp Twice daily  As needed   Allegra '180mg'$  daily for 5 days then As needed   Pepcid '20mg'$  At bedtime  for 5 days as needed.  HRCT chest in 3-4  weeks  Continue on CPAP at bedtime, goal is to wear all night long for at least 6 hours each night Work on healthy weight loss Do not drive if sleepy Follow up with in 4 weeks with Dr. Patsey Berthold or Lelar Farewell NP and As needed  -[place]      OSA (obstructive sleep apnea) Excellent control and compliance on CPAP.  Continue current settings  Plan Patient Instructions  Prednisone '40mg'$  daily for 5 days. Take with food.  Delsym 2 tsp Twice daily  As needed   Allegra '180mg'$  daily for 5 days then As needed   Pepcid '20mg'$  At bedtime  for 5 days as needed.  HRCT chest in 3-4  weeks  Continue on CPAP at bedtime, goal is to wear all night long for at least 6 hours each night Work on healthy weight loss Do not drive if sleepy Follow up with in 4 weeks with Dr. Patsey Berthold or Owen Pratte NP and As needed  -[place]      Preop pulmonary/respiratory exam Pulmonary preop risk assessment.  From a pulmonary standpoint patient has had a recent bronchitis which does appear to be improving.  Will need further evaluation of abnormal chest x-ray.  O2 saturations are adequate.  Patient is returned back to work and remains independent and has no significant severe symptom burden. We discussed potential pulmonary risk associated with surgery.  Major Pulmonary risks identified in the multifactorial risk analysis are but not limited to a) pneumonia; b) recurrent intubation risk; c) prolonged or recurrent acute respiratory failure needing mechanical ventilation; d) prolonged hospitalization; e) DVT/Pulmonary embolism; f) Acute Pulmonary edema  Aggressive pulmonary toilet with o2, bronchodilatation, and  incentive spirometry and early ambulation as indicated.  Rexene Edison, NP 12/12/2022

## 2022-12-12 NOTE — Assessment & Plan Note (Addendum)
Slow to resolve bronchitis.  Does appear to be improving.  Chest x-ray shows increased interstitial markings. Could have a post viral inflammatory process.  Cough does seem to be improved.  Will set up for a follow-up CT scan high-resolution to further evaluate lung parenchyma.  Give short course of steroids with prednisone 40 mg daily.  Patient wants to wait to start steroids until after her breast surgery.  This does seem reasonable. Control for triggers .   Plan  Patient Instructions  Prednisone '40mg'$  daily for 5 days. Take with food.  Delsym 2 tsp Twice daily  As needed   Allegra '180mg'$  daily for 5 days then As needed   Pepcid '20mg'$  At bedtime  for 5 days as needed.  HRCT chest in 3-4  weeks  Continue on CPAP at bedtime, goal is to wear all night long for at least 6 hours each night Work on healthy weight loss Do not drive if sleepy Follow up with in 4 weeks with Dr. Patsey Berthold or Carlester Kasparek NP and As needed  [facility]

## 2022-12-12 NOTE — Patient Instructions (Addendum)
Prednisone '40mg'$  daily for 5 days. Take with food.  Delsym 2 tsp Twice daily  As needed   Allegra '180mg'$  daily for 5 days then As needed   Pepcid '20mg'$  At bedtime  for 5 days as needed.  HRCT chest in 3-4  weeks  Continue on CPAP at bedtime, goal is to wear all night long for at least 6 hours each night Work on healthy weight loss Do not drive if sleepy Follow up with in 4 weeks with Dr. Patsey Berthold or Ketra Duchesne NP and As needed  [facility]

## 2022-12-12 NOTE — Assessment & Plan Note (Signed)
Excellent control and compliance on CPAP.  Continue current settings  Plan Patient Instructions  Prednisone '40mg'$  daily for 5 days. Take with food.  Delsym 2 tsp Twice daily  As needed   Allegra '180mg'$  daily for 5 days then As needed   Pepcid '20mg'$  At bedtime  for 5 days as needed.  HRCT chest in 3-4  weeks  Continue on CPAP at bedtime, goal is to wear all night long for at least 6 hours each night Work on healthy weight loss Do not drive if sleepy Follow up with in 4 weeks with Dr. Patsey Berthold or Bellamia Ferch NP and As needed  [facility]

## 2022-12-12 NOTE — Assessment & Plan Note (Signed)
Pulmonary preop risk assessment.  From a pulmonary standpoint patient has had a recent bronchitis which does appear to be improving.  Will need further evaluation of abnormal chest x-ray.  O2 saturations are adequate.  Patient is returned back to work and remains independent and has no significant severe symptom burden. We discussed potential pulmonary risk associated with surgery.  Major Pulmonary risks identified in the multifactorial risk analysis are but not limited to a) pneumonia; b) recurrent intubation risk; c) prolonged or recurrent acute respiratory failure needing mechanical ventilation; d) prolonged hospitalization; e) DVT/Pulmonary embolism; f) Acute Pulmonary edema  Aggressive pulmonary toilet with o2, bronchodilatation, and incentive spirometry and early ambulation as indicated.

## 2022-12-13 ENCOUNTER — Ambulatory Visit
Admission: RE | Admit: 2022-12-13 | Discharge: 2022-12-13 | Disposition: A | Discharge status: Home / Self Care | Payer: 59 | Source: Ambulatory Visit | Attending: General Surgery | Admitting: General Surgery

## 2022-12-13 DIAGNOSIS — N6092 Unspecified benign mammary dysplasia of left breast: Secondary | ICD-10-CM

## 2022-12-13 HISTORY — PX: BREAST BIOPSY: SHX20

## 2022-12-13 MED ORDER — CHLORHEXIDINE GLUCONATE CLOTH 2 % EX PADS: 6.0000 | MEDICATED_PAD | Freq: Once | CUTANEOUS | Status: DC

## 2022-12-13 NOTE — Progress Notes (Signed)
      Enhanced Recovery after Surgery Enhanced Recovery after Surgery is a protocol used to improve the stress on your body and your recovery after surgery.  Patient Instructions  The night before surgery:  No food after midnight. ONLY clear liquids after midnight  The day of surgery (if you do NOT have diabetes):  Drink ONE (1) Pre-Surgery Clear Ensure as directed.   This drink was given to you during your hospital  pre-op appointment visit. The pre-op nurse will instruct you on the time to drink the  Pre-Surgery Ensure depending on your surgery time. Finish the drink at the designated time by the pre-op nurse.  Nothing else to drink after completing the  Pre-Surgery Clear Ensure.  The day of surgery (if you have diabetes): Drink ONE (1) Gatorade 2 (G2) as directed. This drink was given to you during your hospital  pre-op appointment visit.  The pre-op nurse will instruct you on the time to drink the   Gatorade 2 (G2) depending on your surgery time. Color of the Gatorade may vary. Red is not allowed. Nothing else to drink after completing the  Gatorade 2 (G2).         If you have questions, please contact your surgeon's office.  Surgical soap given to pt with written instructions on how and when to use.  Pt verbalized understanding.

## 2022-12-14 ENCOUNTER — Ambulatory Visit (HOSPITAL_BASED_OUTPATIENT_CLINIC_OR_DEPARTMENT_OTHER): Payer: 59 | Admitting: Anesthesiology

## 2022-12-14 ENCOUNTER — Ambulatory Visit
Admission: RE | Admit: 2022-12-14 | Discharge: 2022-12-14 | Disposition: A | Discharge status: Home / Self Care | Payer: 59 | Source: Ambulatory Visit | Attending: General Surgery | Admitting: General Surgery

## 2022-12-14 ENCOUNTER — Other Ambulatory Visit: Payer: Self-pay

## 2022-12-14 ENCOUNTER — Encounter (HOSPITAL_BASED_OUTPATIENT_CLINIC_OR_DEPARTMENT_OTHER)
Admission: RE | Disposition: A | Discharge status: Home / Self Care | Payer: Self-pay | Source: Home / Self Care | Attending: General Surgery

## 2022-12-14 ENCOUNTER — Encounter (HOSPITAL_BASED_OUTPATIENT_CLINIC_OR_DEPARTMENT_OTHER): Payer: Self-pay | Admitting: General Surgery

## 2022-12-14 ENCOUNTER — Ambulatory Visit (HOSPITAL_BASED_OUTPATIENT_CLINIC_OR_DEPARTMENT_OTHER)
Admission: RE | Admit: 2022-12-14 | Discharge: 2022-12-14 | Disposition: A | Discharge status: Home / Self Care | Payer: 59 | Attending: General Surgery | Admitting: General Surgery

## 2022-12-14 DIAGNOSIS — N6092 Unspecified benign mammary dysplasia of left breast: Secondary | ICD-10-CM | POA: Insufficient documentation

## 2022-12-14 DIAGNOSIS — N6022 Fibroadenosis of left breast: Secondary | ICD-10-CM | POA: Diagnosis present

## 2022-12-14 DIAGNOSIS — G4730 Sleep apnea, unspecified: Secondary | ICD-10-CM | POA: Diagnosis not present

## 2022-12-14 DIAGNOSIS — I10 Essential (primary) hypertension: Secondary | ICD-10-CM | POA: Insufficient documentation

## 2022-12-14 DIAGNOSIS — Z01818 Encounter for other preprocedural examination: Secondary | ICD-10-CM

## 2022-12-14 HISTORY — DX: Sleep apnea, unspecified: G47.30

## 2022-12-14 HISTORY — PX: BREAST LUMPECTOMY WITH RADIOACTIVE SEED LOCALIZATION: SHX6424

## 2022-12-14 SURGERY — BREAST LUMPECTOMY WITH RADIOACTIVE SEED LOCALIZATION
Anesthesia: General | Site: Breast | Laterality: Left

## 2022-12-14 MED ORDER — AMISULPRIDE (ANTIEMETIC) 5 MG/2ML IV SOLN: 10.0000 mg | Freq: Once | INTRAVENOUS | Status: DC | PRN

## 2022-12-14 MED ORDER — GABAPENTIN 300 MG PO CAPS
ORAL_CAPSULE | ORAL | Status: AC
  Filled 2022-12-14: qty 1

## 2022-12-14 MED ORDER — CELECOXIB 200 MG PO CAPS
200.0000 mg | ORAL_CAPSULE | ORAL | Status: AC
  Administered 2022-12-14: 200 mg via ORAL

## 2022-12-14 MED ORDER — PROPOFOL 10 MG/ML IV BOLUS
INTRAVENOUS | Status: DC | PRN
  Administered 2022-12-14: 150 mg via INTRAVENOUS

## 2022-12-14 MED ORDER — CELECOXIB 200 MG PO CAPS
ORAL_CAPSULE | ORAL | Status: AC
  Filled 2022-12-14: qty 1

## 2022-12-14 MED ORDER — FENTANYL CITRATE (PF) 100 MCG/2ML IJ SOLN: 25.0000 ug | INTRAMUSCULAR | Status: DC | PRN

## 2022-12-14 MED ORDER — MIDAZOLAM HCL 2 MG/2ML IJ SOLN
INTRAMUSCULAR | Status: AC
  Filled 2022-12-14: qty 2

## 2022-12-14 MED ORDER — OXYCODONE HCL 5 MG/5ML PO SOLN: 5.0000 mg | Freq: Once | ORAL | Status: DC | PRN

## 2022-12-14 MED ORDER — CEFAZOLIN SODIUM-DEXTROSE 2-4 GM/100ML-% IV SOLN
INTRAVENOUS | Status: AC
  Filled 2022-12-14: qty 100

## 2022-12-14 MED ORDER — DEXAMETHASONE SODIUM PHOSPHATE 10 MG/ML IJ SOLN
INTRAMUSCULAR | Status: DC | PRN
  Administered 2022-12-14: 10 mg via INTRAVENOUS

## 2022-12-14 MED ORDER — CEFAZOLIN SODIUM-DEXTROSE 2-4 GM/100ML-% IV SOLN
2.0000 g | INTRAVENOUS | Status: AC
  Administered 2022-12-14: 2 g via INTRAVENOUS

## 2022-12-14 MED ORDER — PROMETHAZINE HCL 25 MG/ML IJ SOLN: 6.2500 mg | INTRAMUSCULAR | Status: DC | PRN

## 2022-12-14 MED ORDER — OXYCODONE HCL 5 MG PO TABS: 5.0000 mg | ORAL_TABLET | Freq: Four times a day (QID) | ORAL | 0 refills | Status: DC | PRN

## 2022-12-14 MED ORDER — ACETAMINOPHEN 500 MG PO TABS
ORAL_TABLET | ORAL | Status: AC
  Filled 2022-12-14: qty 2

## 2022-12-14 MED ORDER — LIDOCAINE 2% (20 MG/ML) 5 ML SYRINGE
INTRAMUSCULAR | Status: AC
  Filled 2022-12-14: qty 5

## 2022-12-14 MED ORDER — ACETAMINOPHEN 500 MG PO TABS
1000.0000 mg | ORAL_TABLET | ORAL | Status: AC
  Administered 2022-12-14: 1000 mg via ORAL

## 2022-12-14 MED ORDER — LIDOCAINE 2% (20 MG/ML) 5 ML SYRINGE
INTRAMUSCULAR | Status: DC | PRN
  Administered 2022-12-14: 60 mg via INTRAVENOUS

## 2022-12-14 MED ORDER — MIDAZOLAM HCL 2 MG/2ML IJ SOLN
INTRAMUSCULAR | Status: DC | PRN
  Administered 2022-12-14: 2 mg via INTRAVENOUS

## 2022-12-14 MED ORDER — GABAPENTIN 300 MG PO CAPS
300.0000 mg | ORAL_CAPSULE | ORAL | Status: AC
  Administered 2022-12-14: 300 mg via ORAL

## 2022-12-14 MED ORDER — DEXAMETHASONE SODIUM PHOSPHATE 10 MG/ML IJ SOLN
INTRAMUSCULAR | Status: AC
  Filled 2022-12-14: qty 1

## 2022-12-14 MED ORDER — PHENYLEPHRINE 80 MCG/ML (10ML) SYRINGE FOR IV PUSH (FOR BLOOD PRESSURE SUPPORT)
PREFILLED_SYRINGE | INTRAVENOUS | Status: DC | PRN
  Administered 2022-12-14: 160 ug via INTRAVENOUS

## 2022-12-14 MED ORDER — OXYCODONE HCL 5 MG PO TABS: 5.0000 mg | ORAL_TABLET | Freq: Once | ORAL | Status: DC | PRN

## 2022-12-14 MED ORDER — BUPIVACAINE-EPINEPHRINE 0.5% -1:200000 IJ SOLN
INTRAMUSCULAR | Status: DC | PRN
  Administered 2022-12-14: 20 mL

## 2022-12-14 MED ORDER — ONDANSETRON HCL 4 MG/2ML IJ SOLN
INTRAMUSCULAR | Status: DC | PRN
  Administered 2022-12-14: 4 mg via INTRAVENOUS

## 2022-12-14 MED ORDER — ONDANSETRON HCL 4 MG/2ML IJ SOLN
INTRAMUSCULAR | Status: AC
  Filled 2022-12-14: qty 2

## 2022-12-14 MED ORDER — LACTATED RINGERS IV SOLN: INTRAVENOUS | Status: DC

## 2022-12-14 MED ORDER — FENTANYL CITRATE (PF) 100 MCG/2ML IJ SOLN
INTRAMUSCULAR | Status: AC
  Filled 2022-12-14: qty 2

## 2022-12-14 MED ORDER — FENTANYL CITRATE (PF) 100 MCG/2ML IJ SOLN
INTRAMUSCULAR | Status: DC | PRN
  Administered 2022-12-14: 50 ug via INTRAVENOUS

## 2022-12-14 MED ORDER — BUPIVACAINE-EPINEPHRINE (PF) 0.5% -1:200000 IJ SOLN
INTRAMUSCULAR | Status: AC
  Filled 2022-12-14: qty 90

## 2022-12-14 SURGICAL SUPPLY — 45 items
ADH SKN CLS APL DERMABOND .7 (GAUZE/BANDAGES/DRESSINGS) ×1
APL PRP STRL LF DISP 70% ISPRP (MISCELLANEOUS) ×1
APPLIER CLIP 9.375 MED OPEN (MISCELLANEOUS)
APR CLP MED 9.3 20 MLT OPN (MISCELLANEOUS)
BLADE SURG 15 STRL LF DISP TIS (BLADE) ×2 IMPLANT
BLADE SURG 15 STRL SS (BLADE) ×1
CANISTER SUC SOCK COL 7IN (MISCELLANEOUS) ×2 IMPLANT
CANISTER SUCT 1200ML W/VALVE (MISCELLANEOUS) ×2 IMPLANT
CHLORAPREP W/TINT 26 (MISCELLANEOUS) ×2 IMPLANT
CLIP APPLIE 9.375 MED OPEN (MISCELLANEOUS) IMPLANT
COVER BACK TABLE 60X90IN (DRAPES) ×2 IMPLANT
COVER MAYO STAND STRL (DRAPES) ×2 IMPLANT
COVER PROBE CYLINDRICAL 5X96 (MISCELLANEOUS) ×2 IMPLANT
DERMABOND ADVANCED .7 DNX12 (GAUZE/BANDAGES/DRESSINGS) ×2 IMPLANT
DRAPE LAPAROSCOPIC ABDOMINAL (DRAPES) ×2 IMPLANT
DRAPE UTILITY XL STRL (DRAPES) ×2 IMPLANT
ELECT COATED BLADE 2.86 ST (ELECTRODE) ×2 IMPLANT
ELECT REM PT RETURN 9FT ADLT (ELECTROSURGICAL) ×1
ELECTRODE REM PT RTRN 9FT ADLT (ELECTROSURGICAL) ×2 IMPLANT
GLOVE BIO SURGEON STRL SZ7.5 (GLOVE) ×4 IMPLANT
GLOVE BIOGEL PI IND STRL 6.5 (GLOVE) IMPLANT
GLOVE BIOGEL PI IND STRL 7.0 (GLOVE) IMPLANT
GLOVE ECLIPSE 6.5 STRL STRAW (GLOVE) IMPLANT
GOWN STRL REUS W/ TWL LRG LVL3 (GOWN DISPOSABLE) ×4 IMPLANT
GOWN STRL REUS W/TWL LRG LVL3 (GOWN DISPOSABLE) ×2
GOWN STRL REUS W/TWL XL LVL3 (GOWN DISPOSABLE) IMPLANT
ILLUMINATOR WAVEGUIDE N/F (MISCELLANEOUS) IMPLANT
KIT MARKER MARGIN INK (KITS) ×2 IMPLANT
LIGHT WAVEGUIDE WIDE FLAT (MISCELLANEOUS) IMPLANT
NDL HYPO 25X1 1.5 SAFETY (NEEDLE) IMPLANT
NEEDLE HYPO 25X1 1.5 SAFETY (NEEDLE) ×1 IMPLANT
NS IRRIG 1000ML POUR BTL (IV SOLUTION) IMPLANT
PACK BASIN DAY SURGERY FS (CUSTOM PROCEDURE TRAY) ×2 IMPLANT
PENCIL SMOKE EVACUATOR (MISCELLANEOUS) ×2 IMPLANT
SLEEVE SCD COMPRESS KNEE MED (STOCKING) ×2 IMPLANT
SPIKE FLUID TRANSFER (MISCELLANEOUS) IMPLANT
SPONGE T-LAP 18X18 ~~LOC~~+RFID (SPONGE) ×2 IMPLANT
SUT MON AB 4-0 PC3 18 (SUTURE) ×2 IMPLANT
SUT SILK 2 0 SH (SUTURE) IMPLANT
SUT VICRYL 3-0 CR8 SH (SUTURE) ×2 IMPLANT
SYR CONTROL 10ML LL (SYRINGE) IMPLANT
TOWEL GREEN STERILE FF (TOWEL DISPOSABLE) ×2 IMPLANT
TRAY FAXITRON CT DISP (TRAY / TRAY PROCEDURE) ×2 IMPLANT
TUBE CONNECTING 20X1/4 (TUBING) ×2 IMPLANT
YANKAUER SUCT BULB TIP NO VENT (SUCTIONS) IMPLANT

## 2022-12-14 NOTE — H&P (Signed)
REFERRING PHYSICIAN: Alisa Graff, MD  PROVIDER: Landry Corporal, MD  MRN: [id number] DOB: February 14, 1961 Subjective   Chief Complaint: New Consultation (Left Breast )   History of Present Illness: Alison Rodriguez is a 62 y.o. female who is seen today as an office consultation for evaluation of New Consultation (Left Breast ) .   We are asked to see the patient in consultation today by Dr. Einar Pheasant to evaluate her for atypical ductal hyperplasia of the left breast. The patient is a 62 year old black female who recently went for a routine screening mammogram. At that time she was found to have a 9 mm of suspicious calcifications in the upper outer quadrant of the left breast. This was biopsied and came back as atypical ductal hyperplasia. She is otherwise in good health. She has no family history of breast cancer. She does not smoke  Review of Systems: A complete review of systems was obtained from the patient. I have reviewed this information and discussed as appropriate with the patient. See HPI as well for other ROS.  Review of Systems  Constitutional: Negative.  HENT: Negative.  Eyes: Negative.  Respiratory: Negative.  Cardiovascular: Negative.  Gastrointestinal: Negative.  Genitourinary: Negative.  Musculoskeletal: Negative.  Skin: Negative.  Neurological: Negative.  Endo/Heme/Allergies: Negative.  Psychiatric/Behavioral: Negative.    Medical History: Past Medical History:  Diagnosis Date  Arthritis  Hyperlipidemia  Sleep apnea   Patient Active Problem List  Diagnosis  Atypical ductal hyperplasia of left breast   History reviewed. No pertinent surgical history.   No Known Allergies  Current Outpatient Medications on File Prior to Visit  Medication Sig Dispense Refill  predniSONE (DELTASONE) 10 MG tablet 6 tabs x 1 day, 5 tabs x 1 day, 4 tabs x 1 day, etc... 21 tablet 0  rosuvastatin (CRESTOR) 10 MG tablet Take 10 mg by mouth nightly.   No current  facility-administered medications on file prior to visit.   History reviewed. No pertinent family history.   Social History   Tobacco Use  Smoking Status Never  Smokeless Tobacco Never    Social History   Socioeconomic History  Marital status: Married  Tobacco Use  Smoking status: Never  Smokeless tobacco: Never  Substance and Sexual Activity  Alcohol use: Not Currently  Drug use: Never   Objective:   Vitals:  BP: 130/70  Pulse: 104  Temp: 36.5 C (97.7 F)  SpO2: 98%  Weight: 76.6 kg (168 lb 12.8 oz)  Height: 149.9 cm ('4\' 11"'$ )  PainSc: 0-No pain   Body mass index is 34.09 kg/m.  Physical Exam Vitals reviewed.  Constitutional:  General: She is not in acute distress. Appearance: Normal appearance.  HENT:  Head: Normocephalic and atraumatic.  Right Ear: External ear normal.  Left Ear: External ear normal.  Nose: Nose normal.  Mouth/Throat:  Mouth: Mucous membranes are moist.  Pharynx: Oropharynx is clear.  Eyes:  General: No scleral icterus. Extraocular Movements: Extraocular movements intact.  Conjunctiva/sclera: Conjunctivae normal.  Pupils: Pupils are equal, round, and reactive to light.  Cardiovascular:  Rate and Rhythm: Normal rate and regular rhythm.  Pulses: Normal pulses.  Heart sounds: Normal heart sounds.  Pulmonary:  Effort: Pulmonary effort is normal. No respiratory distress.  Breath sounds: Normal breath sounds.  Abdominal:  General: Bowel sounds are normal.  Palpations: Abdomen is soft.  Tenderness: There is no abdominal tenderness.  Musculoskeletal:  General: No swelling, tenderness or deformity. Normal range of motion.  Cervical back: Normal range of motion and  neck supple.  Skin: General: Skin is warm and dry.  Coloration: Skin is not jaundiced.  Neurological:  General: No focal deficit present.  Mental Status: She is alert and oriented to person, place, and time.  Psychiatric:  Mood and Affect: Mood normal.  Behavior:  Behavior normal.     Breast: There is no palpable mass in either breast. There is no palpable axillary, supraclavicular, or cervical lymphadenopathy.  Labs, Imaging and Diagnostic Testing:  Assessment and Plan:   Diagnoses and all orders for this visit:  Atypical ductal hyperplasia of left breast    The patient appears to have a 9 mm area of atypical ductal hyperplasia in the upper outer quadrant of the left breast. Because this is considered a high risk diagnosis and because it can have an appearance similar to ductal carcinoma in situ my recommendation would be to have this area removed. I have discussed with her in detail the risk and benefits of the operation as well as some of the technical aspects including the use of a radioactive seed for localization and she understands and wishes to proceed. I will also refer her to the high risk clinic at the cancer center to talk about risk reduction. The presence of atypical ductal hyperplasia likely increases her lifetime risk of breast cancer to around 30%.

## 2022-12-14 NOTE — Telephone Encounter (Signed)
Per chart, patient is scheduled with the provider listed below.

## 2022-12-14 NOTE — Op Note (Signed)
12/14/2022  9:16 AM  PATIENT:  Alison Rodriguez  62 y.o. female  PRE-OPERATIVE DIAGNOSIS:  LEFT BREAST ADH  POST-OPERATIVE DIAGNOSIS:  LEFT BREAST ADH  PROCEDURE:  Procedure(s): LEFT BREAST LUMPECTOMY WITH RADIOACTIVE SEED LOCALIZATION (Left)  SURGEON:  Surgeon(s) and Role:    * Jovita Kussmaul, MD - Primary  PHYSICIAN ASSISTANT:   ASSISTANTS: none   ANESTHESIA:   local and general  EBL:  minimal   BLOOD ADMINISTERED:none  DRAINS: none   LOCAL MEDICATIONS USED:  MARCAINE     SPECIMEN:  Source of Specimen:  left breast tissue  DISPOSITION OF SPECIMEN:  PATHOLOGY  COUNTS:  YES  TOURNIQUET:  * No tourniquets in log *  DICTATION: .Dragon Dictation  After informed consent was obtained the patient was brought to the operating room and placed in the supine position on the operating table.  After adequate induction of general anesthesia the patient's left breast was prepped with ChloraPrep, allowed to dry, and draped in usual sterile manner.  An appropriate timeout was performed.  Previously an I-125 seed was placed in the upper portion of the left breast to mark an area of atypical ductal hyperplasia.  The neoprobe was set to I-125 in the area of radioactivity was readily identified.  The area around this was infiltrated with quarter percent Marcaine.  A curvilinear incision was made along the upper edge of the areola of the left breast with a 15 blade knife.  The incision was carried through the skin and subcutaneous tissue sharply with the electrocautery.  Dissection was then carried towards the radioactive seed under the direction of the neoprobe with the cautery.  Once I more closely approach the radioactive seed I then removed a circular portion of breast tissue sharply with the electrocautery around the radioactive seed while checking the area of radioactivity frequently.  Once the specimen was removed it was oriented with the appropriate paint colors.  A specimen radiograph was  obtained that showed the clip and seed to be near the center of the specimen.  The specimen was then sent to pathology for further evaluation.  Hemostasis was achieved using the Bovie electrocautery.  The wound was irrigated with saline and infiltrated with more quarter percent Marcaine.  The deep layer of the incision was then closed with layers of interrupted 3-0 Vicryl stitches.  The skin was then closed with interrupted 4-0 Monocryl subcuticular stitches.  Dermabond dressings were applied.  The patient tolerated the procedure well.  At the end of the case all needle sponge and instrument counts were correct.  The patient was then awakened and taken to recovery in stable condition.  PLAN OF CARE: Discharge to home after PACU  PATIENT DISPOSITION:  PACU - hemodynamically stable.   Delay start of Pharmacological VTE agent (>24hrs) due to surgical blood loss or risk of bleeding: not applicable

## 2022-12-14 NOTE — Discharge Instructions (Addendum)
No Tylenol or Ibuprofen until 1:20 today if needed. Alternate between Tylenol and Ibuprofen if needed   Post Anesthesia Home Care Instructions  Activity: Get plenty of rest for the remainder of the day. A responsible individual must stay with you for 24 hours following the procedure.  For the next 24 hours, DO NOT: -Drive a car -Paediatric nurse -Drink alcoholic beverages -Take any medication unless instructed by your physician -Make any legal decisions or sign important papers.  Meals: Start with liquid foods such as gelatin or soup. Progress to regular foods as tolerated. Avoid greasy, spicy, heavy foods. If nausea and/or vomiting occur, drink only clear liquids until the nausea and/or vomiting subsides. Call your physician if vomiting continues.  Special Instructions/Symptoms: Your throat may feel dry or sore from the anesthesia or the breathing tube placed in your throat during surgery. If this causes discomfort, gargle with warm salt water. The discomfort should disappear within 24 hours.  If you had a scopolamine patch placed behind your ear for the management of post- operative nausea and/or vomiting:  1. The medication in the patch is effective for 72 hours, after which it should be removed.  Wrap patch in a tissue and discard in the trash. Wash hands thoroughly with soap and water. 2. You may remove the patch earlier than 72 hours if you experience unpleasant side effects which may include dry mouth, dizziness or visual disturbances. 3. Avoid touching the patch. Wash your hands with soap and water after contact with the patch.

## 2022-12-14 NOTE — Anesthesia Postprocedure Evaluation (Signed)
Anesthesia Post Note  Patient: Alison Rodriguez  Procedure(s) Performed: LEFT BREAST LUMPECTOMY WITH RADIOACTIVE SEED LOCALIZATION (Left: Breast)     Patient location during evaluation: PACU Anesthesia Type: General Level of consciousness: awake Pain management: pain level controlled Vital Signs Assessment: post-procedure vital signs reviewed and stable Respiratory status: spontaneous breathing, nonlabored ventilation and respiratory function stable Cardiovascular status: blood pressure returned to baseline and stable Postop Assessment: no apparent nausea or vomiting Anesthetic complications: no   No notable events documented.  Last Vitals:  Vitals:   12/14/22 1011 12/14/22 1021  BP:  95/67  Pulse: 75 72  Resp: 13 18  Temp:  (!) 36.3 C  SpO2: 100% 97%    Last Pain:  Vitals:   12/14/22 1021  TempSrc: Oral  PainSc: 0-No pain                 Dwaine Pringle P Aayla Marrocco

## 2022-12-14 NOTE — Transfer of Care (Signed)
Immediate Anesthesia Transfer of Care Note  Patient: Alison Rodriguez  Procedure(s) Performed: LEFT BREAST LUMPECTOMY WITH RADIOACTIVE SEED LOCALIZATION (Left: Breast)  Patient Location: PACU  Anesthesia Type:General  Level of Consciousness: awake and drowsy  Airway & Oxygen Therapy: Patient Spontanous Breathing and Patient connected to face mask oxygen  Post-op Assessment: Report given to RN and Post -op Vital signs reviewed and stable  Post vital signs: Reviewed and stable  Last Vitals:  Vitals Value Taken Time  BP 102/61 12/14/22 0922  Temp    Pulse 79 12/14/22 0924  Resp 13 12/14/22 0924  SpO2 100 % 12/14/22 0924  Vitals shown include unvalidated device data.  Last Pain:  Vitals:   12/14/22 0714  TempSrc: Oral  PainSc: 0-No pain      Patients Stated Pain Goal: 3 (68/15/94 7076)  Complications: No notable events documented.

## 2022-12-14 NOTE — Anesthesia Preprocedure Evaluation (Addendum)
Anesthesia Evaluation  Patient identified by MRN, date of birth, ID band Patient awake    Reviewed: Allergy & Precautions, NPO status , Patient's Chart, lab work & pertinent test results  Airway Mallampati: III  TM Distance: >3 FB Neck ROM: Full    Dental no notable dental hx.    Pulmonary sleep apnea and Continuous Positive Airway Pressure Ventilation    Pulmonary exam normal        Cardiovascular hypertension, Normal cardiovascular exam     Neuro/Psych  Headaches  negative psych ROS   GI/Hepatic negative GI ROS, Neg liver ROS,,,  Endo/Other  negative endocrine ROS    Renal/GU negative Renal ROS     Musculoskeletal negative musculoskeletal ROS (+)    Abdominal   Peds  Hematology negative hematology ROS (+)   Anesthesia Other Findings LEFT BREAST ADH  Reproductive/Obstetrics                             Anesthesia Physical Anesthesia Plan  ASA: 2  Anesthesia Plan: General   Post-op Pain Management:    Induction: Intravenous  PONV Risk Score and Plan: 3 and Ondansetron, Dexamethasone, Midazolam and Treatment may vary due to age or medical condition  Airway Management Planned: LMA  Additional Equipment:   Intra-op Plan:   Post-operative Plan: Extubation in OR  Informed Consent: I have reviewed the patients History and Physical, chart, labs and discussed the procedure including the risks, benefits and alternatives for the proposed anesthesia with the patient or authorized representative who has indicated his/her understanding and acceptance.     Dental advisory given  Plan Discussed with: CRNA  Anesthesia Plan Comments:        Anesthesia Quick Evaluation

## 2022-12-14 NOTE — Anesthesia Procedure Notes (Signed)
Procedure Name: LMA Insertion Date/Time: 12/14/2022 8:39 AM  Performed by: [place] Manus Rudd, CRNAPre-anesthesia Checklist: Patient identified, Emergency Drugs available, Suction available and Patient being monitored Patient Re-evaluated:Patient Re-evaluated prior to induction Oxygen Delivery Method: Circle system utilized Preoxygenation: Pre-oxygenation with 100% oxygen Induction Type: IV induction Ventilation: Mask ventilation without difficulty LMA: LMA inserted LMA Size: 4.0 Number of attempts: 1 Airway Equipment and Method: Bite block Placement Confirmation: positive ETCO2 Tube secured with: Tape Dental Injury: Teeth and Oropharynx as per pre-operative assessment

## 2022-12-14 NOTE — Interval H&P Note (Signed)
History and Physical Interval Note:  12/14/2022 8:21 AM  Alison Rodriguez  has presented today for surgery, with the diagnosis of LEFT BREAST ADH.  The various methods of treatment have been discussed with the patient and family. After consideration of risks, benefits and other options for treatment, the patient has consented to  Procedure(s): LEFT BREAST LUMPECTOMY WITH RADIOACTIVE SEED LOCALIZATION (Left) as a surgical intervention.  The patient's history has been reviewed, patient examined, no change in status, stable for surgery.  I have reviewed the patient's chart and labs.  Questions were answered to the patient's satisfaction.     Autumn Messing III

## 2022-12-15 ENCOUNTER — Encounter (HOSPITAL_BASED_OUTPATIENT_CLINIC_OR_DEPARTMENT_OTHER): Payer: Self-pay | Admitting: General Surgery

## 2022-12-15 LAB — SURGICAL PATHOLOGY

## 2022-12-26 NOTE — Progress Notes (Signed)
Agree with the details of the visit as noted by Tammy Parrett, NP.  C. Laura Mikaili Flippin, MD  PCCM 

## 2022-12-26 NOTE — Progress Notes (Signed)
Agree with the details of the visit as noted by Tammy Parrett, NP.  C. Laura Armenia Silveria, MD [facility] 

## 2023-01-02 ENCOUNTER — Ambulatory Visit: Payer: 59 | Attending: Adult Health

## 2023-01-05 ENCOUNTER — Telehealth: Payer: Self-pay | Admitting: Hematology and Oncology

## 2023-01-05 NOTE — Telephone Encounter (Signed)
Spoke with patient confirming upcoming appointments  

## 2023-01-09 ENCOUNTER — Encounter (HOSPITAL_COMMUNITY): Payer: Self-pay

## 2023-01-10 ENCOUNTER — Inpatient Hospital Stay: Payer: 59 | Attending: Hematology and Oncology | Admitting: Hematology and Oncology

## 2023-01-10 ENCOUNTER — Encounter: Payer: Self-pay | Admitting: Hematology and Oncology

## 2023-01-10 VITALS — BP 134/79 | HR 87 | Temp 97.7°F | Resp 16 | Ht 59.0 in | Wt 170.5 lb

## 2023-01-10 DIAGNOSIS — N6092 Unspecified benign mammary dysplasia of left breast: Secondary | ICD-10-CM | POA: Diagnosis not present

## 2023-01-10 DIAGNOSIS — N6099 Unspecified benign mammary dysplasia of unspecified breast: Secondary | ICD-10-CM | POA: Diagnosis present

## 2023-01-10 DIAGNOSIS — Z780 Asymptomatic menopausal state: Secondary | ICD-10-CM | POA: Diagnosis not present

## 2023-01-10 DIAGNOSIS — Z803 Family history of malignant neoplasm of breast: Secondary | ICD-10-CM | POA: Insufficient documentation

## 2023-01-10 MED ORDER — TAMOXIFEN CITRATE 10 MG PO TABS: 10.0000 mg | ORAL_TABLET | Freq: Every day | ORAL | 5 refills | Status: DC

## 2023-01-10 NOTE — Progress Notes (Signed)
[facility] CONSULT NOTE  Patient Care Team: Einar Pheasant, MD as PCP - General (Internal Medicine)  CHIEF COMPLAINTS/PURPOSE OF CONSULTATION:  ADH  ASSESSMENT & PLAN:   This is a very pleasant 62 year old postmenopausal female patient with newly diagnosed atypical ductal hyperplasia referred to high risk breast clinic for recommendations. She is here after lumpectomy which showed no evidence of atypia or malignancy. She previously denied anti estrogen therapy. She however wants to talk about it today once again. Discussed about considering tamoxifen versus aromatase inhibitors again for breast cancer prevention given her history of ADH.  Have discussed about tamoxifen versus aromatase inhibitors, mechanism of action, adverse effects with each class.  We have discussed about the risk of DVT/PE, endometrial hyperplasia and rarely endometrial carcinoma with tamoxifen.  She would like to try tamoxifen first since she is very worried about the bone and aches with aromatase inhibitors.  Tamoxifen dispensed to the pharmacy of her choice.  We can start at 5 mg given ADH however she is not interested in cutting her pills hence she will take 10 mg daily. We have also discussed about role of MRI and breast cancer screening.  She would like to talk to her insurance about the cost that can be encouraged to her and then let us know if she is interested.  I will call her back in 3 months to see how she is tolerating tamoxifen.  She can return to clinic in person in 6 months.  HISTORY OF PRESENTING ILLNESS:  Alison Rodriguez 62 y.o. female is here because of ADH  This is a very pleasant 62 year old postmenopausal female patient who had screening mammogram back in October which showed left breast calcifications, biopsy showed ADH and ALH, she is scheduled for lumpectomy and is also referred to high risk breast clinic for additional recommendations. Pathology from lumpectomy showed no evidence of  atypia or carcinoma. Given history of ADH/ALH, we discussed about considering antiestrogen therapy.  She appears to be interested now.  Previously she did not want to consider it. Rest of the pertinent 10 point ROS reviewed and negative  REVIEW OF SYSTEMS:   Constitutional: Denies fevers, chills or abnormal night sweats Eyes: Denies blurriness of vision, double vision or watery eyes Ears, nose, mouth, throat, and face: Denies mucositis or sore throat Respiratory: Denies cough, dyspnea or wheezes Cardiovascular: Denies palpitation, chest discomfort or lower extremity swelling Gastrointestinal:  Denies nausea, heartburn or change in bowel habits Skin: Denies abnormal skin rashes Lymphatics: Denies new lymphadenopathy or easy bruising Neurological:Denies numbness, tingling or new weaknesses Behavioral/Psych: Mood is stable, no new changes  All other systems were reviewed with the patient and are negative.  MEDICAL HISTORY:  Past Medical History:  Diagnosis Date   Blood in stool    H/O   Hyperlipidemia    Hypertension    Sleep apnea    Thyroid disease     SURGICAL HISTORY: Past Surgical History:  Procedure Laterality Date   BREAST BIOPSY Left 10/06/2022   MM LT BREAST BX W LOC DEV 1ST LESION IMAGE BX SPEC STEREO GUIDE 10/06/2022 [facility]   BREAST BIOPSY  12/13/2022   MM LT RADIOACTIVE SEED LOC MAMMO GUIDE 12/13/2022 [facility]   BREAST LUMPECTOMY WITH RADIOACTIVE SEED LOCALIZATION Left 12/14/2022   Procedure: LEFT BREAST LUMPECTOMY WITH RADIOACTIVE SEED LOCALIZATION;  Surgeon: Jovita Kussmaul, MD;  Location: [facility];  Service: General;  Laterality: Left;   COLONOSCOPY WITH PROPOFOL N/A 02/04/2021   Procedure: COLONOSCOPY  WITH PROPOFOL;  Surgeon: Lucilla Lame, MD;  Location: [place];  Service: Endoscopy;  Laterality: N/A;  priority 4   POLYPECTOMY  02/04/2021   Procedure: POLYPECTOMY;  Surgeon: Lucilla Lame, MD;  Location: [place];  Service: Endoscopy;;   UTERINE FIBROID EMBOLIZATION      SOCIAL HISTORY: Social History   Socioeconomic History   Marital status: Married    Spouse name: Not on file   Number of children: Not on file   Years of education: Not on file   Highest education level: Not on file  Occupational History   Not on file  Tobacco Use   Smoking status: Never    Passive exposure: Yes   Smokeless tobacco: Never  Substance and Sexual Activity   Alcohol use: No    Alcohol/week: 0.0 standard drinks of alcohol   Drug use: No   Sexual activity: Not on file  Other Topics Concern   Not on file  Social History Narrative   Not on file   Social Determinants of Health   Financial Resource Strain: Low Risk  (09/29/2021)   Overall Financial Resource Strain (CARDIA)    Difficulty of Paying Living Expenses: Not hard at all  Food Insecurity: Not on file  Transportation Needs: Not on file  Physical Activity: Not on file  Stress: Not on file  Social Connections: Not on file  Intimate Partner Violence: Not on file    FAMILY HISTORY: Family History  Problem Relation Age of Onset   Diabetes Mother    Diabetes Maternal Grandmother    Arthritis Maternal Grandfather    Diabetes Maternal Grandfather    Breast cancer Other        second cousins/aunt    ALLERGIES:  is allergic to peanut-containing drug products.  MEDICATIONS:  Current Outpatient Medications  Medication Sig Dispense Refill   meloxicam (MOBIC) 15 MG tablet Take 15 mg by mouth daily. (Patient not taking: Reported on 12/12/2022)     oxyCODONE (ROXICODONE) 5 MG immediate release tablet Take 1 tablet (5 mg total) by mouth every 6 (six) hours as needed for severe pain. 10 tablet 0   predniSONE (DELTASONE) 20 MG tablet 2 tabs daily for 5 days 10 tablet 0   rosuvastatin (CRESTOR) 10 MG tablet Take 1 tablet (10 mg total) by mouth daily. 30 tablet 11   No current facility-administered medications for this visit.     PHYSICAL  EXAMINATION: ECOG PERFORMANCE STATUS: 0 - Asymptomatic  There were no vitals filed for this visit.  There were no vitals filed for this visit.   GENERAL:alert, no distress and comfortable Rest of the physical exam deferred in lieu of counseling  LABORATORY DATA:  I have reviewed the data as listed Lab Results  Component Value Date   WBC 6.0 11/27/2022   HGB 14.3 11/27/2022   HCT 43.8 11/27/2022   MCV 89.1 11/27/2022   PLT 306.0 11/27/2022     Chemistry      Component Value Date/Time   NA 141 11/27/2022 0835   NA 139 12/08/2014 1713   K 4.2 11/27/2022 0835   K 4.1 12/08/2014 1713   CL 106 11/27/2022 0835   CL 104 12/08/2014 1713   CO2 27 11/27/2022 0835   CO2 28 12/08/2014 1713   BUN 12 11/27/2022 0835   BUN 11 12/08/2014 1713   CREATININE 0.51 11/27/2022 0835   CREATININE 0.63 12/08/2014 1713      Component Value Date/Time   CALCIUM 9.3 11/27/2022 0835  CALCIUM 8.8 12/08/2014 1713   ALKPHOS 53 11/27/2022 0835   ALKPHOS 68 12/08/2014 1713   AST 17 11/27/2022 0835   AST 25 12/08/2014 1713   ALT 16 11/27/2022 0835   ALT 28 12/08/2014 1713   BILITOT 0.4 11/27/2022 0835   BILITOT 0.3 12/08/2014 1713       RADIOGRAPHIC STUDIES: I have personally reviewed the radiological images as listed and agreed with the findings in the report. MM Breast Surgical Specimen  Result Date: 12/14/2022 CLINICAL DATA:  Status post seed localized LEFT lumpectomy. EXAM: SPECIMEN RADIOGRAPH OF THE LEFT BREAST COMPARISON:  Previous exam(s). FINDINGS: Status post excision of the left breast. The radioactive seed and X shaped biopsy marker clip are present, completely intact, and were marked for pathology. The findings are discussed with the operating room nurse at the time of interpretation. IMPRESSION: Specimen radiograph of the right breast. Electronically Signed   By: Nolon Nations M.D.   On: 12/14/2022 09:04  MM LT RADIOACTIVE SEED LOC MAMMO GUIDE  Result Date: 12/13/2022 CLINICAL  DATA:  PATIENT PRESENTS FOR SEED LOCALIZATION PRIOR TO LUMPECTOMY. RECENT BIOPSY SHOWS ATYPICAL DUCTAL HYPERPLASIA AND ATYPICAL LOBULAR HYPERPLASIA. EXAM: MAMMOGRAPHIC GUIDED RADIOACTIVE SEED LOCALIZATION OF THE LEFT BREAST COMPARISON:  Previous exam(s). FINDINGS: Patient presents for radioactive seed localization prior to excision. I met with the patient and we discussed the procedure of seed localization including benefits and alternatives. We discussed the high likelihood of a successful procedure. We discussed the risks of the procedure including infection, bleeding, tissue injury and further surgery. We discussed the low dose of radioactivity involved in the procedure. Informed, written consent was given. The usual time-out protocol was performed immediately prior to the procedure. Using mammographic guidance, sterile technique, 1% lidocaine and an I-125 radioactive seed, the X shaped clip in the LEFT breast was localized using a LATERAL approach. The follow-up mammogram images confirm the seed in the expected location and were marked for Dr. Marlou Starks. Follow-up survey of the patient confirms presence of the radioactive seed. Order number of I-125 seed:  [id number]. Total activity:  [id number] millicurie reference Date: 11/16/2022 The patient tolerated the procedure well and was released from the [facility]. She was given instructions regarding seed removal. IMPRESSION: Radioactive seed localization of the LEFT breast. No apparent complications. Electronically Signed   By: Nolon Nations M.D.   On: 12/13/2022 13:33   All questions were answered. The patient knows to call the clinic with any problems, questions or concerns. I spent 30 minutes in the care of this patient including H and P, review of records, counseling and coordination of care.     Benay Pike, MD 01/10/2023 8:13 AM

## 2023-01-15 ENCOUNTER — Telehealth: Payer: Self-pay | Admitting: Hematology and Oncology

## 2023-01-15 NOTE — Telephone Encounter (Signed)
Reached out to patient to reschedule appointment, patient aware of date and time of appointment.

## 2023-01-16 ENCOUNTER — Ambulatory Visit (INDEPENDENT_AMBULATORY_CARE_PROVIDER_SITE_OTHER): Payer: 59 | Admitting: Obstetrics & Gynecology

## 2023-01-16 ENCOUNTER — Encounter: Payer: Self-pay | Admitting: Obstetrics & Gynecology

## 2023-01-16 ENCOUNTER — Other Ambulatory Visit (HOSPITAL_COMMUNITY)
Admission: RE | Admit: 2023-01-16 | Discharge: 2023-01-16 | Disposition: A | Discharge status: Home / Self Care | Payer: 59 | Source: Ambulatory Visit | Attending: Obstetrics & Gynecology | Admitting: Obstetrics & Gynecology

## 2023-01-16 VITALS — BP 127/85 | HR 73 | Ht 59.0 in | Wt 166.0 lb

## 2023-01-16 DIAGNOSIS — N950 Postmenopausal bleeding: Secondary | ICD-10-CM

## 2023-01-16 DIAGNOSIS — Z01419 Encounter for gynecological examination (general) (routine) without abnormal findings: Secondary | ICD-10-CM

## 2023-01-16 NOTE — Progress Notes (Signed)
GYNECOLOGY OFFICE VISIT NOTE  History:   Alison Rodriguez is a 62 y.o. PMP F here today for evaluation of one episode of postmenopausal bleeding (PMB).  Went through menopause around age 83, no bleeding until January 2024 when she had 4 days of bleeding needing to wear a pad, associated with pelvic cramping.  She denies any further abnormal vaginal discharge, bleeding, pelvic pain or other concerns.    Past Medical History:  Diagnosis Date   Blood in stool    H/O   Hyperlipidemia    Hypertension    Sleep apnea    Thyroid disease     Past Surgical History:  Procedure Laterality Date   BREAST BIOPSY Left 10/06/2022   MM LT BREAST BX W LOC DEV 1ST LESION IMAGE BX SPEC STEREO GUIDE 10/06/2022 [facility]   BREAST BIOPSY  12/13/2022   MM LT RADIOACTIVE SEED LOC MAMMO GUIDE 12/13/2022 [facility]   BREAST LUMPECTOMY WITH RADIOACTIVE SEED LOCALIZATION Left 12/14/2022   Procedure: LEFT BREAST LUMPECTOMY WITH RADIOACTIVE SEED LOCALIZATION;  Surgeon: Jovita Kussmaul, MD;  Location: [facility];  Service: General;  Laterality: Left;   COLONOSCOPY WITH PROPOFOL N/A 02/04/2021   Procedure: COLONOSCOPY WITH PROPOFOL;  Surgeon: Lucilla Lame, MD;  Location: [place];  Service: Endoscopy;  Laterality: N/A;  priority 4   POLYPECTOMY  02/04/2021   Procedure: POLYPECTOMY;  Surgeon: Lucilla Lame, MD;  Location: [facility];  Service: Endoscopy;;   UTERINE FIBROID EMBOLIZATION      The following portions of the patient's history were reviewed and updated as appropriate: allergies, current medications, past family history, past medical history, past social history, past surgical history and problem list.   Health Maintenance:  Normal pap and negative HRHPV on 10/04/2020.  Benign extensive breast imaging, last one on  12/14/22 s/p lumpectomy for atypical ductal hyperplasia of breast. Benign colonoscopy on 02/04/2021.  Review of Systems:  Pertinent items noted in  HPI and remainder of comprehensive ROS otherwise negative.  Physical Exam:  BP 127/85   Pulse 73   Ht '4\' 11"'$  (1.499 m)   Wt 166 lb (75.3 kg)   LMP 06/22/2015 (Approximate)   BMI 33.53 kg/m  CONSTITUTIONAL: Well-developed, well-nourished female in no acute distress.  HEENT:  Normocephalic, atraumatic. External right and left ear normal. No scleral icterus.  NECK: Normal range of motion, supple, no masses noted on observation SKIN: No rash noted. Not diaphoretic. No erythema. No pallor. MUSCULOSKELETAL: Normal range of motion. No edema noted. NEUROLOGIC: Alert and oriented to person, place, and time. Normal muscle tone coordination. No cranial nerve deficit noted. PSYCHIATRIC: Normal mood and affect. Normal behavior. Normal judgment and thought content. CARDIOVASCULAR: Normal heart rate noted RESPIRATORY: Effort and breath sounds normal, no problems with respiration noted ABDOMEN: No masses noted. No other overt distention noted.   PELVIC: Normal appearing external genitalia; normal urethral meatus; normal appearing vaginal mucosa and cervix. Pap smear done.  No abnormal discharge noted.  Not able to palpate uterine size due to habitus, no other palpable masses, no uterine or adnexal tenderness. Performed in the presence of a chaperone     Assessment and Plan:    1. Postmenopausal bleeding Discussed etiologies of postmenopausal bleeding, concern about precancerous/hyperplasia or cancerous etiology (5 to 10% percent of cases).  However, she was reassured that endometrial atrophy and endometrial polyps are the most common causes of postmenopausal bleeding.  Uterine bleeding in postmenopausal women is usually light and self-limited. Exclusion of cancer is  the main objective; therefore, treatment is usually unnecessary once cancer has been excluded.  The primary goal in the diagnostic evaluation of postmenopausal women with uterine bleeding is to exclude malignancy; this can include endometrial  biopsy and pelvic ultrasound.   Further diagnostic evaluation is indicated for recurrent or persistent bleeding. Patient wants to defer endometrial biopsy until next visit, wants to take analgesic premedication. Also desires cervical block during procedure. Information about procedure given to her.   Ultrasound scheduled. - Cytology - PAP - US PELVIC COMPLETE WITH TRANSVAGINAL; Future Please refer to After Visit Summary for other counseling recommendations.   Return in about 2 weeks (around 01/30/2023) for Endometrial biopsy.    I spent 30 minutes dedicated to the care of this patient including pre-visit review of records, face to face time with the patient discussing her conditions and treatments and post visit orders.    Verita Schneiders, MD, [place] for [employer], [place]

## 2023-01-25 ENCOUNTER — Encounter: Payer: Self-pay | Admitting: Obstetrics & Gynecology

## 2023-01-25 LAB — CYTOLOGY - PAP
Comment: NEGATIVE
Diagnosis: UNDETERMINED — AB
High risk HPV: NEGATIVE

## 2023-01-29 ENCOUNTER — Ambulatory Visit
Admission: RE | Admit: 2023-01-29 | Discharge: 2023-01-29 | Disposition: A | Discharge status: Home / Self Care | Payer: 59 | Source: Ambulatory Visit | Attending: Obstetrics & Gynecology | Admitting: Obstetrics & Gynecology

## 2023-01-29 DIAGNOSIS — N950 Postmenopausal bleeding: Secondary | ICD-10-CM | POA: Insufficient documentation

## 2023-02-06 ENCOUNTER — Ambulatory Visit (INDEPENDENT_AMBULATORY_CARE_PROVIDER_SITE_OTHER): Payer: 59 | Admitting: Obstetrics & Gynecology

## 2023-02-06 ENCOUNTER — Other Ambulatory Visit (HOSPITAL_COMMUNITY)
Admission: RE | Admit: 2023-02-06 | Discharge: 2023-02-06 | Disposition: A | Discharge status: Home / Self Care | Payer: 59 | Source: Ambulatory Visit | Attending: Obstetrics & Gynecology | Admitting: Obstetrics & Gynecology

## 2023-02-06 ENCOUNTER — Encounter: Payer: Self-pay | Admitting: Obstetrics & Gynecology

## 2023-02-06 VITALS — BP 127/81 | HR 74 | Wt 165.0 lb

## 2023-02-06 DIAGNOSIS — N950 Postmenopausal bleeding: Secondary | ICD-10-CM

## 2023-02-06 NOTE — Progress Notes (Signed)
      GYNECOLOGY OFFICE PROCEDURE NOTE   Alison Rodriguez is a 62 y.o. PMP here for endometrial biopsy for postmenopausal bleeding. Recent ultrasound also showed 5.6 mm endometrial stripe, no other concerns.  Today, she reports no concerning symptoms. Of note, pap on  01/16/23 was ASCUS, negative HPV.   ENDOMETRIAL BIOPSY     The indications for endometrial biopsy were reviewed.   Risks of the biopsy including cramping, bleeding, infection, uterine perforation, inadequate specimen and need for additional procedures were discussed. Offered alternative of hysteroscopy, dilation and curettage in OR. The patient states she understands the R/B/I/A and agrees to undergo procedure today. Urine pregnancy test was negative. Consent was signed. Time out was performed. Chaperone was present during entire procedure.   Patient was positioned in dorsal lithotomy position. A vaginal speculum was placed.  The cervix was visualized and was prepped with Betadine. A paracervical block was administered with 20 ml of 1% Lidocaine.  A single-toothed tenaculum was placed on the anterior lip of the cervix to stabilize it. The 3 mm pipelle was easily introduced into the endometrial cavity without difficulty to a depth of 9 cm, and a moderate amount of tissue was obtained after two passes and sent to pathology. The instruments were removed from the patient's vagina. Minimal bleeding from the cervix was noted. The patient tolerated the procedure well.   Patient was given post procedure instructions.  Will follow up pathology and manage accordingly; patient will be contacted with results and recommendations.  Routine preventative health maintenance measures emphasized.       Alison Schneiders, MD, [place] for [employer], [place]

## 2023-02-06 NOTE — Patient Instructions (Signed)
ENDOMETRIAL BIOPSY POST-PROCEDURE INSTRUCTIONS  You may take Ibuprofen, Aleve or Tylenol for pain if needed.  Cramping should resolve within in 24 hours.  You may have a small amount of spotting.  You should wear a mini pad for the next few days.  You may have intercourse after 48 hours.  You need to call if you have any pelvic pain, fever, heavy bleeding or foul smelling vaginal discharge.  Shower or bathe as normal  6. We will call you within one week with results.   

## 2023-02-07 LAB — SURGICAL PATHOLOGY

## 2023-02-08 ENCOUNTER — Telehealth: Payer: Self-pay

## 2023-02-08 NOTE — Telephone Encounter (Signed)
-----   Message from Osborne Oman, MD sent at 02/08/2023 11:57 AM EDT ----- Benign endometrial biopsy, shows benign endometrial polyp.  No evidence of precancerous or cancerous cells.  No intervention needed for now.  If bleeding continues, may need hysteroscopy, dilation and curettage and removal of polyp.   Please call to inform patient of results and recommendations. Of note, results were also released to MyChart and patient was given recommendations as indicated.

## 2023-02-08 NOTE — Telephone Encounter (Signed)
TC to pt to make aware of results/My chart message from provider No answer LVM.

## 2023-04-02 ENCOUNTER — Ambulatory Visit (INDEPENDENT_AMBULATORY_CARE_PROVIDER_SITE_OTHER): Payer: 59 | Admitting: Internal Medicine

## 2023-04-02 ENCOUNTER — Encounter: Payer: Self-pay | Admitting: Internal Medicine

## 2023-04-02 ENCOUNTER — Other Ambulatory Visit: Payer: Self-pay | Admitting: Internal Medicine

## 2023-04-02 VITALS — BP 118/70 | HR 68 | Temp 98.0°F | Resp 16 | Ht 59.0 in | Wt 168.2 lb

## 2023-04-02 DIAGNOSIS — N6099 Unspecified benign mammary dysplasia of unspecified breast: Secondary | ICD-10-CM

## 2023-04-02 DIAGNOSIS — R87610 Atypical squamous cells of undetermined significance on cytologic smear of cervix (ASC-US): Secondary | ICD-10-CM

## 2023-04-02 DIAGNOSIS — E7800 Pure hypercholesterolemia, unspecified: Secondary | ICD-10-CM

## 2023-04-02 DIAGNOSIS — N950 Postmenopausal bleeding: Secondary | ICD-10-CM

## 2023-04-02 DIAGNOSIS — Z1211 Encounter for screening for malignant neoplasm of colon: Secondary | ICD-10-CM

## 2023-04-02 DIAGNOSIS — G4733 Obstructive sleep apnea (adult) (pediatric): Secondary | ICD-10-CM | POA: Diagnosis not present

## 2023-04-02 DIAGNOSIS — R9389 Abnormal findings on diagnostic imaging of other specified body structures: Secondary | ICD-10-CM

## 2023-04-02 LAB — HEPATIC FUNCTION PANEL
ALT: 14 U/L (ref 0–35)
AST: 13 U/L (ref 0–37)
Albumin: 3.7 g/dL (ref 3.5–5.2)
Alkaline Phosphatase: 51 U/L (ref 39–117)
Bilirubin, Direct: 0 mg/dL (ref 0.0–0.3)
Total Bilirubin: 0.3 mg/dL (ref 0.2–1.2)
Total Protein: 6.9 g/dL (ref 6.0–8.3)

## 2023-04-02 LAB — BASIC METABOLIC PANEL
BUN: 13 mg/dL (ref 6–23)
CO2: 25 mEq/L (ref 19–32)
Calcium: 9 mg/dL (ref 8.4–10.5)
Chloride: 108 mEq/L (ref 96–112)
Creatinine, Ser: 0.6 mg/dL (ref 0.40–1.20)
GFR: 98.25 mL/min (ref >60.00)
Glucose, Bld: 100 mg/dL — ABNORMAL HIGH (ref 70–99)
Potassium: 3.9 mEq/L (ref 3.5–5.1)
Sodium: 140 mEq/L (ref 135–145)

## 2023-04-02 LAB — LIPID PANEL
Cholesterol: 181 mg/dL (ref 0–200)
HDL: 51.1 mg/dL (ref >39.00)
LDL Cholesterol: 118 mg/dL — ABNORMAL HIGH (ref 0–99)
NonHDL: 130.13
Total CHOL/HDL Ratio: 4
Triglycerides: 63 mg/dL (ref 0.0–149.0)
VLDL: 12.6 mg/dL (ref 0.0–40.0)

## 2023-04-02 MED ORDER — ROSUVASTATIN CALCIUM 10 MG PO TABS: 10.0000 mg | ORAL_TABLET | Freq: Every day | ORAL | 11 refills | Status: DC

## 2023-04-02 NOTE — Assessment & Plan Note (Signed)
Seeing pulmonary.  CPAP.  

## 2023-04-02 NOTE — Assessment & Plan Note (Signed)
Colonoscopy 02/04/21 - pathology: One 4 mm polyp in the descending colon, removed with a cold snare. Resected and retrieved. Non-bleeding internal hemorrhoids. Pathology tubular adenoma.  F/u colonoscopy 7 years (per Dr Wohl).  ?

## 2023-04-02 NOTE — Assessment & Plan Note (Signed)
Is s/p left breast lumpectomy with radioactive seed localization for atypical ductal hyperplasia. (12/14/22)   Saw oncology 01/10/23 - started on tamoxifen.  Having hot flashes.  Plans to discuss with oncology.

## 2023-04-02 NOTE — Assessment & Plan Note (Signed)
Saw gyn (Dr Macon Large) - ASCUS on pap with negative HPV on 01/16/23.  No intervention needed.  Can repeat cotesting in three years.

## 2023-04-02 NOTE — Assessment & Plan Note (Signed)
CXR 12/08/22 - Persistent mild increased pulmonary interstitium bilaterally. This can be seen as sequela of atypical infection or bronchitis. Saw pulmonary.  Recommended HrCT.  She wanted to hold at this time.  Discussed.  States has had too much going on.  Wants to hold on further testing.  Will follow.  No cough or congestion currently.  Breathing stable.

## 2023-04-02 NOTE — Assessment & Plan Note (Signed)
Saw gyn for the PMB.  Benign endometrial biopsy, shows benign endometrial polyp. No evidence of precancerous or cancerous cells. Recommended to continue to follow. She denies vaginal bleeding currently.

## 2023-04-02 NOTE — Assessment & Plan Note (Signed)
Continue crestor.  Low cholesterol diet and exercise.  Follow lipid panel and liver function tests.  

## 2023-04-02 NOTE — Progress Notes (Signed)
Subjective:    Patient ID: Alison Rodriguez, female    DOB: [birth year], 62 y.o.   MRN: [id number]  Patient here for  Chief Complaint  Patient presents with   Medical Management of Chronic Issues    HPI Here to follow up regarding atypical ductal hyperplasia of breast, previous post menopausal bleeding and hypercholesterolemia. Saw gyn for the PMB.  Benign endometrial biopsy, shows benign endometrial polyp. No evidence of precancerous or cancerous cells. Recommended to continue to follow. She denies vaginal bleeding currently.  Is s/p left breast lumpectomy with radioactive seed localization for atypical ductal hyperplasia. (12/14/22)   Saw oncology 01/10/23 - started on tamoxifen.  Having hot flashes.  Plans to discuss with oncology. Discussed today.  Will continue a while longer to see if symptoms improve.  No chest pain or sob reported.  No abdominal pain or bowel change.     Past Medical History:  Diagnosis Date   Blood in stool    H/O   Hyperlipidemia    Hypertension    Sleep apnea    Thyroid disease    Past Surgical History:  Procedure Laterality Date   BREAST BIOPSY Left 10/06/2022   MM LT BREAST BX W LOC DEV 1ST LESION IMAGE BX SPEC STEREO GUIDE 10/06/2022 [facility]   BREAST BIOPSY  12/13/2022   MM LT RADIOACTIVE SEED LOC MAMMO GUIDE 12/13/2022 [facility]   BREAST LUMPECTOMY WITH RADIOACTIVE SEED LOCALIZATION Left 12/14/2022   Procedure: LEFT BREAST LUMPECTOMY WITH RADIOACTIVE SEED LOCALIZATION;  Surgeon: Griselda Miner, MD;  Location: [facility];  Service: General;  Laterality: Left;   COLONOSCOPY WITH PROPOFOL N/A 02/04/2021   Procedure: COLONOSCOPY WITH PROPOFOL;  Surgeon: Midge Minium, MD;  Location: [facility];  Service: Endoscopy;  Laterality: N/A;  priority 4   POLYPECTOMY  02/04/2021   Procedure: POLYPECTOMY;  Surgeon: Midge Minium, MD;  Location: [facility];  Service: Endoscopy;;   UTERINE FIBROID EMBOLIZATION      Family History  Problem Relation Age of Onset   Diabetes Mother    Diabetes Maternal Grandmother    Arthritis Maternal Grandfather    Diabetes Maternal Grandfather    Breast cancer Other        second cousins/aunt   Social History   Socioeconomic History   Marital status: Married    Spouse name: Not on file   Number of children: Not on file   Years of education: Not on file   Highest education level: Bachelor's degree (e.g., BA, AB, BS)  Occupational History   Not on file  Tobacco Use   Smoking status: Never    Passive exposure: Yes   Smokeless tobacco: Never  Substance and Sexual Activity   Alcohol use: No    Alcohol/week: 0.0 standard drinks of alcohol   Drug use: No   Sexual activity: Not on file  Other Topics Concern   Not on file  Social History Narrative   Not on file   Social Determinants of Health   Financial Resource Strain: Low Risk  (04/01/2023)   Overall Financial Resource Strain (CARDIA)    Difficulty of Paying Living Expenses: Not very hard  Food Insecurity: No Food Insecurity (04/01/2023)   Hunger Vital Sign    Worried About Running Out of Food in the Last Year: Never true    Ran Out of Food in the Last Year: Never true  Transportation Needs: No Transportation Needs (04/01/2023)   PRAPARE - Transportation    Lack of  Transportation (Medical): No    Lack of Transportation (Non-Medical): No  Physical Activity: Insufficiently Active (04/01/2023)   Exercise Vital Sign    Days of Exercise per Week: 2 days    Minutes of Exercise per Session: 30 min  Stress: No Stress Concern Present (04/01/2023)   [employer] of Occupational Health - Occupational Stress Questionnaire    Feeling of Stress : Only a little  Social Connections: Moderately Integrated (04/01/2023)   Social Connection and Isolation Panel [NHANES]    Frequency of Communication with Friends and Family: Once a week    Frequency of Social Gatherings with Friends and Family: Once a week     Attends Religious Services: 1 to 4 times per year    Active Member of [employer] or Organizations: Yes    Attends Engineer, structural: More than 4 times per year    Marital Status: Married     Review of Systems  Constitutional:  Negative for appetite change and unexpected weight change.  HENT:  Negative for congestion and sinus pressure.   Respiratory:  Negative for cough, chest tightness and shortness of breath.   Cardiovascular:  Negative for chest pain, palpitations and leg swelling.  Gastrointestinal:  Negative for abdominal pain, diarrhea, nausea and vomiting.  Endocrine:       Hot flashes.   Genitourinary:  Negative for difficulty urinating and dysuria.  Musculoskeletal:  Negative for joint swelling and myalgias.  Skin:  Negative for color change and rash.  Neurological:  Negative for dizziness and headaches.  Psychiatric/Behavioral:  Negative for agitation and dysphoric mood.        Objective:     BP 118/70   Pulse 68   Temp 98 F (36.7 C)   Resp 16   Ht 4\' 11"  (1.499 m)   Wt 168 lb 3.2 oz (76.3 kg)   LMP 06/22/2015 (Approximate)   SpO2 98%   BMI 33.97 kg/m  Wt Readings from Last 3 Encounters:  04/02/23 168 lb 3.2 oz (76.3 kg)  02/06/23 165 lb (74.8 kg)  01/16/23 166 lb (75.3 kg)    Physical Exam Vitals reviewed.  Constitutional:      General: She is not in acute distress.    Appearance: Normal appearance.  HENT:     Head: Normocephalic and atraumatic.     Right Ear: External ear normal.     Left Ear: External ear normal.  Eyes:     General: No scleral icterus.       Right eye: No discharge.        Left eye: No discharge.     Conjunctiva/sclera: Conjunctivae normal.  Neck:     Thyroid: No thyromegaly.  Cardiovascular:     Rate and Rhythm: Normal rate and regular rhythm.  Pulmonary:     Effort: No respiratory distress.     Breath sounds: Normal breath sounds. No wheezing.  Abdominal:     General: Bowel sounds are normal.     Palpations:  Abdomen is soft.     Tenderness: There is no abdominal tenderness.  Musculoskeletal:        General: No swelling or tenderness.     Cervical back: Neck supple. No tenderness.  Lymphadenopathy:     Cervical: No cervical adenopathy.  Skin:    Findings: No erythema or rash.  Neurological:     Mental Status: She is alert.  Psychiatric:        Mood and Affect: Mood normal.  Behavior: Behavior normal.      Outpatient Encounter Medications as of 04/02/2023  Medication Sig   meloxicam (MOBIC) 15 MG tablet Take 15 mg by mouth daily as needed.   rosuvastatin (CRESTOR) 10 MG tablet Take 1 tablet (10 mg total) by mouth daily.   tamoxifen (NOLVADEX) 10 MG tablet Take 1 tablet (10 mg total) by mouth daily.   [DISCONTINUED] oxyCODONE (ROXICODONE) 5 MG immediate release tablet Take 1 tablet (5 mg total) by mouth every 6 (six) hours as needed for severe pain.   [DISCONTINUED] predniSONE (DELTASONE) 20 MG tablet 2 tabs daily for 5 days (Patient not taking: Reported on 01/16/2023)   [DISCONTINUED] rosuvastatin (CRESTOR) 10 MG tablet Take 1 tablet (10 mg total) by mouth daily.   No facility-administered encounter medications on file as of 04/02/2023.     Lab Results  Component Value Date   WBC 6.0 11/27/2022   HGB 14.3 11/27/2022   HCT 43.8 11/27/2022   PLT 306.0 11/27/2022   GLUCOSE 77 11/27/2022   CHOL 168 11/27/2022   TRIG 85.0 11/27/2022   HDL 50.50 11/27/2022   LDLCALC 100 (H) 11/27/2022   ALT 16 11/27/2022   AST 17 11/27/2022   NA 141 11/27/2022   K 4.2 11/27/2022   CL 106 11/27/2022   CREATININE 0.51 11/27/2022   BUN 12 11/27/2022   CO2 27 11/27/2022   TSH 1.72 11/27/2022    No results found.     Assessment & Plan:  Hypercholesterolemia Assessment & Plan: Continue crestor.  Low cholesterol diet and exercise.  Follow lipid panel and liver function tests.   Orders: -     Basic metabolic panel -     Hepatic function panel -     Lipid panel  ASCUS of cervix with  negative high risk HPV on 01/16/23 Assessment & Plan: Saw gyn (Dr Macon Large) - ASCUS on pap with negative HPV on 01/16/23.  No intervention needed.  Can repeat cotesting in three years.    Atypical ductal hyperplasia of breast Assessment & Plan:  Is s/p left breast lumpectomy with radioactive seed localization for atypical ductal hyperplasia. (12/14/22)   Saw oncology 01/10/23 - started on tamoxifen.  Having hot flashes.  Plans to discuss with oncology.    Colon cancer screening Assessment & Plan: Colonoscopy 02/04/21 - pathology: One 4 mm polyp in the descending colon, removed with a cold snare. Resected and retrieved. Non-bleeding internal hemorrhoids. Pathology tubular adenoma.  F/u colonoscopy 7 years (per Dr Servando Snare).    OSA (obstructive sleep apnea) Assessment & Plan: Seeing pulmonary.  CPAP.    Postmenopausal bleeding Assessment & Plan: Saw gyn for the PMB.  Benign endometrial biopsy, shows benign endometrial polyp. No evidence of precancerous or cancerous cells. Recommended to continue to follow. She denies vaginal bleeding currently.    Abnormal CXR Assessment & Plan: CXR 12/08/22 - Persistent mild increased pulmonary interstitium bilaterally. This can be seen as sequela of atypical infection or bronchitis. Saw pulmonary.  Recommended HrCT.  She wanted to hold at this time.  Discussed.  States has had too much going on.  Wants to hold on further testing.  Will follow.  No cough or congestion currently.  Breathing stable.    Other orders -     Rosuvastatin Calcium; Take 1 tablet (10 mg total) by mouth daily.  Dispense: 30 tablet; Refill: 11     Dale , MD

## 2023-04-06 ENCOUNTER — Telehealth: Payer: 59 | Admitting: Hematology and Oncology

## 2023-04-13 ENCOUNTER — Inpatient Hospital Stay: Payer: 59 | Attending: Hematology and Oncology | Admitting: Hematology and Oncology

## 2023-04-13 DIAGNOSIS — N6092 Unspecified benign mammary dysplasia of left breast: Secondary | ICD-10-CM

## 2023-04-13 MED ORDER — GABAPENTIN 100 MG PO CAPS: 100.0000 mg | ORAL_CAPSULE | Freq: Every day | ORAL | 1 refills | Status: DC

## 2023-04-13 NOTE — Progress Notes (Signed)
[facility] CONSULT NOTE  Patient Care Team: Dale [place], MD as PCP - General (Internal Medicine)  CHIEF COMPLAINTS/PURPOSE OF CONSULTATION:  ADH  ASSESSMENT & PLAN:   This is a very pleasant 62 year old postmenopausal female patient with newly diagnosed atypical ductal hyperplasia referred to high risk breast clinic for recommendations. She is here after lumpectomy which showed no evidence of atypia or malignancy. She is now on low dose tamoxifen 10 mg for ADH and high risk of breast cancer. She says she had bad hot flashes but these have settled down. Besides hot flashes, she denies any other major symptoms. We have discussed about considering gabapentin 100 mg nightly for hot flashes, she understands the drowsiness that may come with the medication.  Besides that she is already scheduled for mammogram in October.  We can see her back in 6 months or sooner as needed.  She is fine with this plan.  She was however encouraged to contact us sooner.  I once again reminded her of the adverse effects of tamoxifen including but not limited to the risk of DVT/PE, endometrial hyperplasia and endometrial carcinoma.  HISTORY OF PRESENTING ILLNESS:  Alison Rodriguez 62 y.o. female is here because of ADH  This is a very pleasant 63 year old postmenopausal female patient who had screening mammogram back in October which showed left breast calcifications, biopsy showed ADH and ALH. Pathology from lumpectomy showed no evidence of atypia or carcinoma. She is here for telephone visit on tamoxifen. She complains of hot flashes which are tolerable on most days but she wants to have a prescription for gabapentin just in case. No other side effects.  Mammogram scheduled for Oct 2024.  MEDICAL HISTORY:  Past Medical History:  Diagnosis Date   Blood in stool    H/O   Hyperlipidemia    Hypertension    Sleep apnea    Thyroid disease     SURGICAL HISTORY: Past Surgical History:   Procedure Laterality Date   BREAST BIOPSY Left 10/06/2022   MM LT BREAST BX W LOC DEV 1ST LESION IMAGE BX SPEC STEREO GUIDE 10/06/2022 [facility]   BREAST BIOPSY  12/13/2022   MM LT RADIOACTIVE SEED LOC MAMMO GUIDE 12/13/2022 [facility]   BREAST LUMPECTOMY WITH RADIOACTIVE SEED LOCALIZATION Left 12/14/2022   Procedure: LEFT BREAST LUMPECTOMY WITH RADIOACTIVE SEED LOCALIZATION;  Surgeon: Griselda Miner, MD;  Location: [facility];  Service: General;  Laterality: Left;   COLONOSCOPY WITH PROPOFOL N/A 02/04/2021   Procedure: COLONOSCOPY WITH PROPOFOL;  Surgeon: Midge Minium, MD;  Location: [facility];  Service: Endoscopy;  Laterality: N/A;  priority 4   POLYPECTOMY  02/04/2021   Procedure: POLYPECTOMY;  Surgeon: Midge Minium, MD;  Location: [facility];  Service: Endoscopy;;   UTERINE FIBROID EMBOLIZATION      SOCIAL HISTORY: Social History   Socioeconomic History   Marital status: Married    Spouse name: Not on file   Number of children: Not on file   Years of education: Not on file   Highest education level: Bachelor's degree (e.g., BA, AB, BS)  Occupational History   Not on file  Tobacco Use   Smoking status: Never    Passive exposure: Yes   Smokeless tobacco: Never  Substance and Sexual Activity   Alcohol use: No    Alcohol/week: 0.0 standard drinks of alcohol   Drug use: No   Sexual activity: Not on file  Other Topics Concern   Not on file  Social  History Narrative   Not on file   Social Determinants of Health   Financial Resource Strain: Low Risk  (04/01/2023)   Overall Financial Resource Strain (CARDIA)    Difficulty of Paying Living Expenses: Not very hard  Food Insecurity: No Food Insecurity (04/01/2023)   Hunger Vital Sign    Worried About Running Out of Food in the Last Year: Never true    Ran Out of Food in the Last Year: Never true  Transportation Needs: No Transportation Needs (04/01/2023)   PRAPARE - Therapist, art (Medical): No    Lack of Transportation (Non-Medical): No  Physical Activity: Insufficiently Active (04/01/2023)   Exercise Vital Sign    Days of Exercise per Week: 2 days    Minutes of Exercise per Session: 30 min  Stress: No Stress Concern Present (04/01/2023)   [employer] of Occupational Health - Occupational Stress Questionnaire    Feeling of Stress : Only a little  Social Connections: Moderately Integrated (04/01/2023)   Social Connection and Isolation Panel [NHANES]    Frequency of Communication with Friends and Family: Once a week    Frequency of Social Gatherings with Friends and Family: Once a week    Attends Religious Services: 1 to 4 times per year    Active Member of [employer] or Organizations: Yes    Attends Engineer, structural: More than 4 times per year    Marital Status: Married  Catering manager Violence: Not on file    FAMILY HISTORY: Family History  Problem Relation Age of Onset   Diabetes Mother    Diabetes Maternal Grandmother    Arthritis Maternal Grandfather    Diabetes Maternal Grandfather    Breast cancer Other        second cousins/aunt    ALLERGIES:  is allergic to peanut-containing drug products.  MEDICATIONS:  Current Outpatient Medications  Medication Sig Dispense Refill   meloxicam (MOBIC) 15 MG tablet Take 15 mg by mouth daily as needed.     rosuvastatin (CRESTOR) 10 MG tablet Take 1 tablet (10 mg total) by mouth daily. 30 tablet 11   tamoxifen (NOLVADEX) 10 MG tablet Take 1 tablet (10 mg total) by mouth daily. 30 tablet 5   No current facility-administered medications for this visit.     PHYSICAL EXAMINATION: ECOG PERFORMANCE STATUS: 0 - Asymptomatic  There were no vitals filed for this visit.  There were no vitals filed for this visit.   Telephone visit.  LABORATORY DATA:  I have reviewed the data as listed Lab Results  Component Value Date   WBC 6.0 11/27/2022   HGB 14.3 11/27/2022    HCT 43.8 11/27/2022   MCV 89.1 11/27/2022   PLT 306.0 11/27/2022     Chemistry      Component Value Date/Time   NA 140 04/02/2023 0756   NA 139 12/08/2014 1713   K 3.9 04/02/2023 0756   K 4.1 12/08/2014 1713   CL 108 04/02/2023 0756   CL 104 12/08/2014 1713   CO2 25 04/02/2023 0756   CO2 28 12/08/2014 1713   BUN 13 04/02/2023 0756   BUN 11 12/08/2014 1713   CREATININE 0.60 04/02/2023 0756   CREATININE 0.63 12/08/2014 1713      Component Value Date/Time   CALCIUM 9.0 04/02/2023 0756   CALCIUM 8.8 12/08/2014 1713   ALKPHOS 51 04/02/2023 0756   ALKPHOS 68 12/08/2014 1713   AST 13 04/02/2023 0756   AST 25 12/08/2014 1713  ALT 14 04/02/2023 0756   ALT 28 12/08/2014 1713   BILITOT 0.3 04/02/2023 0756   BILITOT 0.3 12/08/2014 1713       RADIOGRAPHIC STUDIES: I have personally reviewed the radiological images as listed and agreed with the findings in the report. No results found.  All questions were answered. The patient knows to call the clinic with any problems, questions or concerns. I spent 12 minutes in the care of this patient including H and P, review of records, counseling and coordination of care.     Rachel Moulds, MD 04/13/2023 1:27 PM

## 2023-04-17 ENCOUNTER — Telehealth: Payer: Self-pay | Admitting: Hematology and Oncology

## 2023-04-17 NOTE — Telephone Encounter (Signed)
Left patient a vm regarding upcoming appointment  

## 2023-06-01 ENCOUNTER — Telehealth (INDEPENDENT_AMBULATORY_CARE_PROVIDER_SITE_OTHER): Payer: 59 | Admitting: Primary Care

## 2023-06-01 ENCOUNTER — Encounter: Payer: Self-pay | Admitting: Primary Care

## 2023-06-01 DIAGNOSIS — G4730 Sleep apnea, unspecified: Secondary | ICD-10-CM

## 2023-06-01 DIAGNOSIS — R9389 Abnormal findings on diagnostic imaging of other specified body structures: Secondary | ICD-10-CM

## 2023-06-01 DIAGNOSIS — J209 Acute bronchitis, unspecified: Secondary | ICD-10-CM

## 2023-06-01 DIAGNOSIS — J40 Bronchitis, not specified as acute or chronic: Secondary | ICD-10-CM

## 2023-06-01 NOTE — Patient Instructions (Signed)
CXR next week to follow up on atypical bronchitis from January  Continue to wear CPAP nightly Follow-up 6 months with APP

## 2023-06-01 NOTE — Progress Notes (Signed)
Reviewed and agree with assessment/plan.   Coralyn Helling, MD [facility] Pulmonary/Critical Care 06/01/2023, 2:17 PM Pager:  [phone]

## 2023-06-01 NOTE — Progress Notes (Signed)
Virtual Visit via Video Note  I connected with Norval Gable on 06/01/23 at 11:00 AM EDT by a video enabled telemedicine application and verified that I am speaking with the correct person using two identifiers.  Location: Patient: Home Provider: Office    I discussed the limitations of evaluation and management by telemedicine and the availability of in person appointments. The patient expressed understanding and agreed to proceed.  History of Present Illness: 62 year old female, never smoked. PMH significant for OSA, bronchitis.   06/01/2023 Patient contacted today for overdue follow-up. She was last seen in January for bronchitis, ordered for HRCT but she elected not to have this done due to cost. She no longer has a cough. She is well controlled on auto CPAP. And 100% compliant with use. Current pressure 5-15cm h20. She sleeps well at night, wakes up to use the restroom. No longer snoring. She continues to have fatigue symptoms which she contributes to Tamoxifen medication.    Airview download 04/30/23-05/29/23 Usage 30/30 days; 100% > 4 hours Average usage 6 hours 18 mins Pressure 5-15cm h20 (11.4cm h20-95%) Airleaks 16L/min AHI 0.9   Observations/Objective:  - Patient appears well without overt respiratory symptoms   Assessment and Plan:  Sleep apnea: - HST 07/10/22>> moderate OSA, AHI 17.3/hour with SpO2 low 83% - Well controlled on auto PAP and reports benefit from use  - Patient is 100% compliant with CPAP use > 4 hours last 30 days - Pressure 5-15cm h20; Residual AHI 0.9  - No changes today, advised patient continue to wear CPAP nightly   Bronchitis:  - Symptoms have resolved  - Recommend repeating CXR to follow-up on atypical bronchitis from January 2024  Follow Up Instructions:   - 6 months or sooner if needed   I discussed the assessment and treatment plan with the patient. The patient was provided an opportunity to ask questions and all were answered. The  patient agreed with the plan and demonstrated an understanding of the instructions.   The patient was advised to call back or seek an in-person evaluation if the symptoms worsen or if the condition fails to improve as anticipated.  I provided 22 minutes of non-face-to-face time during this encounter.   Glenford Bayley, NP

## 2023-06-24 ENCOUNTER — Other Ambulatory Visit: Payer: Self-pay | Admitting: Hematology and Oncology

## 2023-07-05 ENCOUNTER — Encounter (INDEPENDENT_AMBULATORY_CARE_PROVIDER_SITE_OTHER): Payer: Self-pay

## 2023-07-09 ENCOUNTER — Ambulatory Visit: Payer: 59 | Admitting: Hematology and Oncology

## 2023-08-06 ENCOUNTER — Encounter: Payer: Self-pay | Admitting: Internal Medicine

## 2023-08-06 ENCOUNTER — Ambulatory Visit (INDEPENDENT_AMBULATORY_CARE_PROVIDER_SITE_OTHER): Payer: 59 | Admitting: Internal Medicine

## 2023-08-06 VITALS — BP 116/72 | HR 74 | Temp 97.5°F | Ht 59.0 in | Wt 169.0 lb

## 2023-08-06 DIAGNOSIS — M25562 Pain in left knee: Secondary | ICD-10-CM | POA: Diagnosis not present

## 2023-08-06 DIAGNOSIS — R87610 Atypical squamous cells of undetermined significance on cytologic smear of cervix (ASC-US): Secondary | ICD-10-CM

## 2023-08-06 DIAGNOSIS — N6099 Unspecified benign mammary dysplasia of unspecified breast: Secondary | ICD-10-CM | POA: Diagnosis not present

## 2023-08-06 DIAGNOSIS — N950 Postmenopausal bleeding: Secondary | ICD-10-CM

## 2023-08-06 DIAGNOSIS — E7800 Pure hypercholesterolemia, unspecified: Secondary | ICD-10-CM | POA: Diagnosis not present

## 2023-08-06 DIAGNOSIS — G4733 Obstructive sleep apnea (adult) (pediatric): Secondary | ICD-10-CM

## 2023-08-06 DIAGNOSIS — R0989 Other specified symptoms and signs involving the circulatory and respiratory systems: Secondary | ICD-10-CM

## 2023-08-06 DIAGNOSIS — Z1211 Encounter for screening for malignant neoplasm of colon: Secondary | ICD-10-CM

## 2023-08-06 LAB — BASIC METABOLIC PANEL
BUN: 10 mg/dL (ref 6–23)
CO2: 25 meq/L (ref 19–32)
Calcium: 9.2 mg/dL (ref 8.4–10.5)
Chloride: 107 meq/L (ref 96–112)
Creatinine, Ser: 0.57 mg/dL (ref 0.40–1.20)
GFR: 99.23 mL/min (ref >60.00)
Glucose, Bld: 92 mg/dL (ref 70–99)
Potassium: 3.8 meq/L (ref 3.5–5.1)
Sodium: 140 meq/L (ref 135–145)

## 2023-08-06 LAB — LIPID PANEL
Cholesterol: 162 mg/dL (ref 0–200)
HDL: 49.1 mg/dL (ref >39.00)
LDL Cholesterol: 97 mg/dL (ref 0–99)
NonHDL: 112.4
Total CHOL/HDL Ratio: 3
Triglycerides: 78 mg/dL (ref 0.0–149.0)
VLDL: 15.6 mg/dL (ref 0.0–40.0)

## 2023-08-06 LAB — HEPATIC FUNCTION PANEL
ALT: 14 U/L (ref 0–35)
AST: 16 U/L (ref 0–37)
Albumin: 3.8 g/dL (ref 3.5–5.2)
Alkaline Phosphatase: 50 U/L (ref 39–117)
Bilirubin, Direct: 0 mg/dL (ref 0.0–0.3)
Total Bilirubin: 0.3 mg/dL (ref 0.2–1.2)
Total Protein: 7.2 g/dL (ref 6.0–8.3)

## 2023-08-06 MED ORDER — MELOXICAM 15 MG PO TABS: 15.0000 mg | ORAL_TABLET | Freq: Every day | ORAL | 0 refills | Status: DC | PRN

## 2023-08-06 NOTE — Assessment & Plan Note (Signed)
Seeing pulmonary.  CPAP.

## 2023-08-06 NOTE — Progress Notes (Signed)
Subjective:    Patient ID: Alison [facility], female    DOB: 28-Feb-1961, 62 y.o.   MRN: [id number]  Patient here for  Chief Complaint  Patient presents with   Medication Management    HPI Here to follow up regarding atypical ductal hyperplasia of breast, previous post menopausal bleeding and hypercholesterolemia. Saw gyn for the PMB.  Benign endometrial biopsy, shows benign endometrial polyp. No evidence of precancerous or cancerous cells. Recommended to continue to follow. She denies vaginal bleeding currently.  Is s/p left breast lumpectomy with radioactive seed localization for atypical ductal hyperplasia. (12/14/22)   Saw oncology 01/10/23 - started on tamoxifen.had f/u 03/2023 - stable. Seeing pulmonary - f/u sleep apnea.  Using cpap. Regarding bronchitis - symptoms resolved.  Desired to hold on HRCT.  Discussed again today.  Prefers to hold on scan.  Feels breathing is stable.  No sob reported. No chest pain.  Reports increased issues with clearing of her throat. Has noticed before.  Reports previously on prilosec and discussed this may have been improving her symptoms.  Discussed trial of PPI to see if improved.  No nasal congestion.  No chest congestion.  Also reports increased left knee pain.  Persistent since her fall.  Has seen ortho.  Had xrays.  Arthritis changes.  Persistent pain - some days worse than others. Limiting her activity.  Is walking.  Discussed PT.      Past Medical History:  Diagnosis Date   Blood in stool    H/O   Hyperlipidemia    Hypertension    Sleep apnea    Thyroid disease    Past Surgical History:  Procedure Laterality Date   BREAST BIOPSY Left 10/06/2022   MM LT BREAST BX W LOC DEV 1ST LESION IMAGE BX SPEC STEREO GUIDE 10/06/2022 [facility]   BREAST BIOPSY  12/13/2022   MM LT RADIOACTIVE SEED LOC MAMMO GUIDE 12/13/2022 [facility]   BREAST LUMPECTOMY WITH RADIOACTIVE SEED LOCALIZATION Left 12/14/2022   Procedure: LEFT BREAST LUMPECTOMY WITH  RADIOACTIVE SEED LOCALIZATION;  Surgeon: Griselda Miner, MD;  Location: [facility];  Service: General;  Laterality: Left;   COLONOSCOPY WITH PROPOFOL N/A 02/04/2021   Procedure: COLONOSCOPY WITH PROPOFOL;  Surgeon: Midge Minium, MD;  Location: [facility];  Service: Endoscopy;  Laterality: N/A;  priority 4   POLYPECTOMY  02/04/2021   Procedure: POLYPECTOMY;  Surgeon: Midge Minium, MD;  Location: [facility];  Service: Endoscopy;;   UTERINE FIBROID EMBOLIZATION     Family History  Problem Relation Age of Onset   Diabetes Mother    Diabetes Maternal Grandmother    Arthritis Maternal Grandfather    Diabetes Maternal Grandfather    Breast cancer Other        second cousins/aunt   Social History   Socioeconomic History   Marital status: Married    Spouse name: Not on file   Number of children: Not on file   Years of education: Not on file   Highest education level: Bachelor's degree (e.g., BA, AB, BS)  Occupational History   Not on file  Tobacco Use   Smoking status: Never    Passive exposure: Yes   Smokeless tobacco: Never  Substance and Sexual Activity   Alcohol use: No    Alcohol/week: 0.0 standard drinks of alcohol   Drug use: No   Sexual activity: Not on file  Other Topics Concern   Not on file  Social History Narrative   Not on file  Social Determinants of Health   Financial Resource Strain: Low Risk  (04/01/2023)   Overall Financial Resource Strain (CARDIA)    Difficulty of Paying Living Expenses: Not very hard  Food Insecurity: No Food Insecurity (04/01/2023)   Hunger Vital Sign    Worried About Running Out of Food in the Last Year: Never true    Ran Out of Food in the Last Year: Never true  Transportation Needs: No Transportation Needs (04/01/2023)   PRAPARE - Administrator, Civil Service (Medical): No    Lack of Transportation (Non-Medical): No  Physical Activity: Insufficiently Active (04/01/2023)   Exercise Vital Sign     Days of Exercise per Week: 2 days    Minutes of Exercise per Session: 30 min  Stress: No Stress Concern Present (04/01/2023)   [employer] of Occupational Health - Occupational Stress Questionnaire    Feeling of Stress : Only a little  Social Connections: Moderately Integrated (04/01/2023)   Social Connection and Isolation Panel [NHANES]    Frequency of Communication with Friends and Family: Once a week    Frequency of Social Gatherings with Friends and Family: Once a week    Attends Religious Services: 1 to 4 times per year    Active Member of [employer] or Organizations: Yes    Attends Engineer, structural: More than 4 times per year    Marital Status: Married     Review of Systems  Constitutional:  Negative for appetite change and unexpected weight change.  HENT:  Negative for congestion and sinus pressure.        Clearing of throat as outlined.   Respiratory:  Negative for cough, chest tightness and shortness of breath.   Cardiovascular:  Negative for chest pain and palpitations.       Reports noticing increased swelling since starting tamoxifen.   Gastrointestinal:  Negative for abdominal pain, diarrhea, nausea and vomiting.  Genitourinary:  Negative for difficulty urinating and dysuria.  Musculoskeletal:  Negative for myalgias.       Left knee pain as outlined.   Skin:  Negative for color change and rash.  Neurological:  Negative for dizziness and headaches.  Psychiatric/Behavioral:  Negative for agitation and dysphoric mood.        Objective:     BP 116/72   Pulse 74   Temp (!) 97.5 F (36.4 C) (Oral)   Ht 4\' 11"  (1.499 m)   Wt 169 lb (76.7 kg)   LMP 06/22/2015 (Approximate)   SpO2 100%   BMI 34.13 kg/m  Wt Readings from Last 3 Encounters:  08/06/23 169 lb (76.7 kg)  04/02/23 168 lb 3.2 oz (76.3 kg)  02/06/23 165 lb (74.8 kg)    Physical Exam Vitals reviewed.  Constitutional:      General: She is not in acute distress.    Appearance: Normal  appearance.  HENT:     Head: Normocephalic and atraumatic.     Right Ear: External ear normal.     Left Ear: External ear normal.  Eyes:     General: No scleral icterus.       Right eye: No discharge.        Left eye: No discharge.     Conjunctiva/sclera: Conjunctivae normal.  Neck:     Thyroid: No thyromegaly.  Cardiovascular:     Rate and Rhythm: Normal rate and regular rhythm.  Pulmonary:     Effort: No respiratory distress.     Breath sounds: Normal breath sounds.  No wheezing.  Abdominal:     General: Bowel sounds are normal.     Palpations: Abdomen is soft.     Tenderness: There is no abdominal tenderness.  Musculoskeletal:        General: No tenderness.     Cervical back: Neck supple. No tenderness.     Comments: DP pulses palpable and equal bilaterally.  Full rom - left knee.  No increased pain with resistance against flexion and extension.   Lymphadenopathy:     Cervical: No cervical adenopathy.  Skin:    Findings: No erythema or rash.  Neurological:     Mental Status: She is alert.  Psychiatric:        Mood and Affect: Mood normal.        Behavior: Behavior normal.      Outpatient Encounter Medications as of 08/06/2023  Medication Sig   gabapentin (NEURONTIN) 100 MG capsule Take 1 capsule (100 mg total) by mouth at bedtime.   meloxicam (MOBIC) 15 MG tablet Take 1 tablet (15 mg total) by mouth daily as needed.   rosuvastatin (CRESTOR) 10 MG tablet Take 1 tablet (10 mg total) by mouth daily.   tamoxifen (NOLVADEX) 10 MG tablet TAKE 1 TABLET BY MOUTH EVERY DAY   [DISCONTINUED] meloxicam (MOBIC) 15 MG tablet Take 15 mg by mouth daily as needed.   No facility-administered encounter medications on file as of 08/06/2023.     Lab Results  Component Value Date   WBC 6.0 11/27/2022   HGB 14.3 11/27/2022   HCT 43.8 11/27/2022   PLT 306.0 11/27/2022   GLUCOSE 100 (H) 04/02/2023   CHOL 181 04/02/2023   TRIG 63.0 04/02/2023   HDL 51.10 04/02/2023   LDLCALC 118 (H)  04/02/2023   ALT 14 04/02/2023   AST 13 04/02/2023   NA 140 04/02/2023   K 3.9 04/02/2023   CL 108 04/02/2023   CREATININE 0.60 04/02/2023   BUN 13 04/02/2023   CO2 25 04/02/2023   TSH 1.72 11/27/2022    No results found.     Assessment & Plan:  Hypercholesterolemia Assessment & Plan: Continue crestor.  Low cholesterol diet and exercise.  Follow lipid panel and liver function tests.   Orders: -     Lipid panel -     Hepatic function panel -     Basic metabolic panel  Left knee pain, unspecified chronicity Assessment & Plan: Persistent knee pain.  Saw [facility] 01/2023 - injection (cortisone).  Mobic.  Request refill mobic.  Persistent intermittent pain as outlined.  Limiting activity.  Previous xray - per her report - arthritis.  Refer to PT for further evaluation and treatment.    Orders: -     Ambulatory referral to Physical Therapy  ASCUS of cervix with negative high risk HPV on 01/16/23 Assessment & Plan: Saw gyn (Dr Macon Large) - ASCUS on pap with negative HPV on 01/16/23.  No intervention needed.  Can repeat cotesting in three years.    Atypical ductal hyperplasia of breast Assessment & Plan: Is s/p left breast lumpectomy with radioactive seed localization for atypical ductal hyperplasia. (12/14/22)   Saw oncology 01/10/23 - started on tamoxifen.had f/u 03/2023 - stable.   Colon cancer screening Assessment & Plan: Colonoscopy 02/04/21 - pathology: One 4 mm polyp in the descending colon, removed with a cold snare. Resected and retrieved. Non-bleeding internal hemorrhoids. Pathology tubular adenoma.  F/u colonoscopy 7 years (per Dr Servando Snare).    OSA (obstructive sleep apnea) Assessment & Plan: Seeing  pulmonary.  CPAP.    Postmenopausal bleeding Assessment & Plan: Saw gyn for the PMB.  Benign endometrial biopsy, shows benign endometrial polyp. No evidence of precancerous or cancerous cells. Recommended to continue to follow. No report of further vaginal bleeding.    Throat  clearing Assessment & Plan: Increased throat clearing as outlined.  Discussed.  No nasal congestion or drainage.  No increased acid production. Does report previously being on prilosec.  Question if helped.  Restart prilosec.  Follow.  Call with update in the next several weeks.  Follow.    Other orders -     Meloxicam; Take 1 tablet (15 mg total) by mouth daily as needed.  Dispense: 30 tablet; Refill: 0     Dale Lackawanna, MD

## 2023-08-06 NOTE — Assessment & Plan Note (Signed)
Saw gyn for the PMB.  Benign endometrial biopsy, shows benign endometrial polyp. No evidence of precancerous or cancerous cells. Recommended to continue to follow. No report of further vaginal bleeding.

## 2023-08-06 NOTE — Assessment & Plan Note (Signed)
Saw gyn (Dr Macon Large) - ASCUS on pap with negative HPV on 01/16/23.  No intervention needed.  Can repeat cotesting in three years.

## 2023-08-06 NOTE — Assessment & Plan Note (Signed)
Persistent knee pain.  Saw [facility] 01/2023 - injection (cortisone).  Mobic.  Request refill mobic.  Persistent intermittent pain as outlined.  Limiting activity.  Previous xray - per her report - arthritis.  Refer to PT for further evaluation and treatment.

## 2023-08-06 NOTE — Assessment & Plan Note (Signed)
Colonoscopy 02/04/21 - pathology: One 4 mm polyp in the descending colon, removed with a cold snare. Resected and retrieved. Non-bleeding internal hemorrhoids. Pathology tubular adenoma.  F/u colonoscopy 7 years (per Dr Servando Snare).  ?

## 2023-08-06 NOTE — Assessment & Plan Note (Signed)
Increased throat clearing as outlined.  Discussed.  No nasal congestion or drainage.  No increased acid production. Does report previously being on prilosec.  Question if helped.  Restart prilosec.  Follow.  Call with update in the next several weeks.  Follow.

## 2023-08-06 NOTE — Patient Instructions (Signed)
Take prilosec 20mg  - take 30 minutes before breakfast

## 2023-08-06 NOTE — Assessment & Plan Note (Signed)
Continue crestor.  Low cholesterol diet and exercise.  Follow lipid panel and liver function tests.  

## 2023-08-06 NOTE — Assessment & Plan Note (Signed)
Is s/p left breast lumpectomy with radioactive seed localization for atypical ductal hyperplasia. (12/14/22)   Saw oncology 01/10/23 - started on tamoxifen.had f/u 03/2023 - stable.

## 2023-09-03 ENCOUNTER — Ambulatory Visit
Admission: RE | Admit: 2023-09-03 | Discharge: 2023-09-03 | Disposition: A | Discharge status: Home / Self Care | Payer: 59 | Source: Ambulatory Visit | Attending: Hematology and Oncology

## 2023-09-03 DIAGNOSIS — N6092 Unspecified benign mammary dysplasia of left breast: Secondary | ICD-10-CM

## 2023-09-04 ENCOUNTER — Other Ambulatory Visit: Payer: Self-pay | Admitting: Internal Medicine

## 2023-09-04 NOTE — Telephone Encounter (Signed)
Was prescribed at last visit for her knee.  Need to confirm if she still needs the medication.  How often is she using?

## 2023-10-11 ENCOUNTER — Inpatient Hospital Stay: Payer: 59 | Attending: Hematology and Oncology | Admitting: Hematology and Oncology

## 2023-10-12 ENCOUNTER — Encounter: Payer: Self-pay | Admitting: Hematology and Oncology

## 2023-10-15 ENCOUNTER — Inpatient Hospital Stay (HOSPITAL_BASED_OUTPATIENT_CLINIC_OR_DEPARTMENT_OTHER): Payer: 59 | Admitting: Hematology and Oncology

## 2023-10-15 DIAGNOSIS — N6092 Unspecified benign mammary dysplasia of left breast: Secondary | ICD-10-CM

## 2023-10-15 NOTE — Progress Notes (Signed)
Alison Rodriguez CONSULT NOTE  Patient Care Team: Dale West Burke, MD as PCP - General (Internal Medicine)  CHIEF COMPLAINTS/PURPOSE OF CONSULTATION:  ADH  ASSESSMENT & PLAN:   This is a very pleasant 62 year old postmenopausal female patient with newly diagnosed atypical ductal hyperplasia referred to high risk breast clinic for recommendations. She is here after lumpectomy which showed no evidence of atypia or malignancy. She is now on low dose tamoxifen 10 mg for ADH and high risk of breast cancer. She is here for telephone follow up. She today complains of some joint pains and wonders if Tamoxifen is causing it. We discussed about continuing tamoxifen 5 mg daily for now and reassess her symptoms in a few weeks.  She is scheduled to see [place] in January.  She is agreeable to this.  I do not believe the tamoxifen is related to the acid reflux.  This could be contributed by the meloxicam as well.  She was encouraged to call us sooner if her symptoms persist and she expressed understanding of it.  HISTORY OF PRESENTING ILLNESS:  Alison Rodriguez 62 y.o. female is here because of ADH  This is a very pleasant 62 year old postmenopausal female patient who had screening mammogram back in October which showed left breast calcifications, biopsy showed ADH and ALH. Pathology from lumpectomy showed no evidence of atypia or carcinoma. She is here for telephone visit on tamoxifen. She says hot flashes are better, however started noticing joint pain in left shoulder. She denies any exertional activities, and the pain has been going on for over 2 weeks. She does think joint pain is related to tamoxifen.  She had a mammogram recently and this was negative for malignancy. Rest of the pertinent 10 point ROS reviewed and negative  MEDICAL HISTORY:  Past Medical History:  Diagnosis Date   Blood in stool    H/O   Hyperlipidemia    Hypertension    Sleep apnea    Thyroid disease     SURGICAL  HISTORY: Past Surgical History:  Procedure Laterality Date   BREAST BIOPSY Left 10/06/2022   MM LT BREAST BX W LOC DEV 1ST LESION IMAGE BX SPEC STEREO GUIDE 10/06/2022 [facility]   BREAST BIOPSY  12/13/2022   MM LT RADIOACTIVE SEED LOC MAMMO GUIDE 12/13/2022 [facility]   BREAST LUMPECTOMY WITH RADIOACTIVE SEED LOCALIZATION Left 12/14/2022   Procedure: LEFT BREAST LUMPECTOMY WITH RADIOACTIVE SEED LOCALIZATION;  Surgeon: Griselda Miner, MD;  Location: [facility];  Service: General;  Laterality: Left;   COLONOSCOPY WITH PROPOFOL N/A 02/04/2021   Procedure: COLONOSCOPY WITH PROPOFOL;  Surgeon: Midge Minium, MD;  Location: [facility];  Service: Endoscopy;  Laterality: N/A;  priority 4   POLYPECTOMY  02/04/2021   Procedure: POLYPECTOMY;  Surgeon: Midge Minium, MD;  Location: [facility];  Service: Endoscopy;;   UTERINE FIBROID EMBOLIZATION      SOCIAL HISTORY: Social History   Socioeconomic History   Marital status: Married    Spouse name: Not on file   Number of children: Not on file   Years of education: Not on file   Highest education level: Bachelor's degree (e.g., BA, AB, BS)  Occupational History   Not on file  Tobacco Use   Smoking status: Never    Passive exposure: Yes   Smokeless tobacco: Never  Substance and Sexual Activity   Alcohol use: No    Alcohol/week: 0.0 standard drinks of alcohol   Drug use: No   Sexual activity:  Not on file  Other Topics Concern   Not on file  Social History Narrative   Not on file   Social Determinants of Health   Financial Resource Strain: Low Risk  (04/01/2023)   Overall Financial Resource Strain (CARDIA)    Difficulty of Paying Living Expenses: Not very hard  Food Insecurity: No Food Insecurity (04/01/2023)   Hunger Vital Sign    Worried About Running Out of Food in the Last Year: Never true    Ran Out of Food in the Last Year: Never true  Transportation Needs: No Transportation Needs  (04/01/2023)   PRAPARE - Administrator, Civil Service (Medical): No    Lack of Transportation (Non-Medical): No  Physical Activity: Insufficiently Active (04/01/2023)   Exercise Vital Sign    Days of Exercise per Week: 2 days    Minutes of Exercise per Session: 30 min  Stress: No Stress Concern Present (04/01/2023)   [employer] of Occupational Health - Occupational Stress Questionnaire    Feeling of Stress : Only a little  Social Connections: Moderately Integrated (04/01/2023)   Social Connection and Isolation Panel [NHANES]    Frequency of Communication with Friends and Family: Once a week    Frequency of Social Gatherings with Friends and Family: Once a week    Attends Religious Services: 1 to 4 times per year    Active Member of [employer] or Organizations: Yes    Attends Engineer, structural: More than 4 times per year    Marital Status: Married  Catering manager Violence: Unknown (02/22/2022)   Received from [employer], [facility]   HITS    Physically Hurt: Not on file    Insult or Talk Down To: Not on file    Threaten Physical Harm: Not on file    Scream or Curse: Not on file    FAMILY HISTORY: Family History  Problem Relation Age of Onset   Diabetes Mother    Diabetes Maternal Grandmother    Arthritis Maternal Grandfather    Diabetes Maternal Grandfather    Breast cancer Other        second cousins/aunt    ALLERGIES:  is allergic to peanut-containing drug products.  MEDICATIONS:  Current Outpatient Medications  Medication Sig Dispense Refill   gabapentin (NEURONTIN) 100 MG capsule Take 1 capsule (100 mg total) by mouth at bedtime. 60 capsule 1   meloxicam (MOBIC) 15 MG tablet Take 1 tablet (15 mg total) by mouth daily as needed. 30 tablet 0   rosuvastatin (CRESTOR) 10 MG tablet Take 1 tablet (10 mg total) by mouth daily. 30 tablet 11   tamoxifen (NOLVADEX) 10 MG tablet TAKE 1 TABLET BY MOUTH EVERY DAY 30 tablet 5   No current  facility-administered medications for this visit.     PHYSICAL EXAMINATION: ECOG PERFORMANCE STATUS: 0 - Asymptomatic  There were no vitals filed for this visit.  There were no vitals filed for this visit.   Telephone visit.  LABORATORY DATA:  I have reviewed the data as listed Lab Results  Component Value Date   WBC 6.0 11/27/2022   HGB 14.3 11/27/2022   HCT 43.8 11/27/2022   MCV 89.1 11/27/2022   PLT 306.0 11/27/2022     Chemistry      Component Value Date/Time   NA 140 08/06/2023 0743   NA 139 12/08/2014 1713   K 3.8 08/06/2023 0743   K 4.1 12/08/2014 1713   CL 107 08/06/2023 0743   CL  104 12/08/2014 1713   CO2 25 08/06/2023 0743   CO2 28 12/08/2014 1713   BUN 10 08/06/2023 0743   BUN 11 12/08/2014 1713   CREATININE 0.57 08/06/2023 0743   CREATININE 0.63 12/08/2014 1713      Component Value Date/Time   CALCIUM 9.2 08/06/2023 0743   CALCIUM 8.8 12/08/2014 1713   ALKPHOS 50 08/06/2023 0743   ALKPHOS 68 12/08/2014 1713   AST 16 08/06/2023 0743   AST 25 12/08/2014 1713   ALT 14 08/06/2023 0743   ALT 28 12/08/2014 1713   BILITOT 0.3 08/06/2023 0743   BILITOT 0.3 12/08/2014 1713       RADIOGRAPHIC STUDIES: I have personally reviewed the radiological images as listed and agreed with the findings in the report. No results found.  All questions were answered. The patient knows to call the clinic with any problems, questions or concerns. I spent 12 minutes in the care of this patient including H and P, review of records, counseling and coordination of care.     Rachel Moulds, MD 10/15/2023 11:22 AM

## 2023-11-09 ENCOUNTER — Encounter: Payer: Self-pay | Admitting: Internal Medicine

## 2023-11-09 NOTE — Telephone Encounter (Signed)
Pt scheduled with Dr Clent Ridges on Monday

## 2023-11-12 ENCOUNTER — Ambulatory Visit (INDEPENDENT_AMBULATORY_CARE_PROVIDER_SITE_OTHER): Payer: 59 | Admitting: Family Medicine

## 2023-11-12 VITALS — BP 120/76 | HR 69 | Temp 98.0°F | Resp 18 | Ht 59.0 in | Wt 173.1 lb

## 2023-11-12 DIAGNOSIS — H6693 Otitis media, unspecified, bilateral: Secondary | ICD-10-CM | POA: Diagnosis not present

## 2023-11-12 MED ORDER — AMOXICILLIN-POT CLAVULANATE 875-125 MG PO TABS: 1.0000 | ORAL_TABLET | Freq: Two times a day (BID) | ORAL | 0 refills | Status: AC

## 2023-11-12 MED ORDER — SACCHAROMYCES BOULARDII 250 MG PO CAPS: 250.0000 mg | ORAL_CAPSULE | Freq: Every day | ORAL | 0 refills | Status: AC

## 2023-11-12 MED ORDER — FLUTICASONE PROPIONATE 50 MCG/ACT NA SUSP: 2.0000 | Freq: Every day | NASAL | 6 refills | Status: AC

## 2023-11-12 NOTE — Patient Instructions (Addendum)
It was a pleasure meeting you today. Thank you for allowing me to take part in your health care.  Our goals for today as we discussed include:  Start Augmentin 1 tablet two times a day for 5 days Start Probiotics daily and continue for 14 days after completion of antibiotics Start Flonase 2 sprays daily to each nostril   This is a list of the screening recommended for you and due dates:  Health Maintenance  Topic Date Due   HIV Screening  Never done   Zoster (Shingles) Vaccine (1 of 2) Never done   COVID-19 Vaccine (4 - 2024-25 season) 07/22/2023   Flu Shot  02/18/2024*   Mammogram  09/02/2024   Pap with HPV screening  01/17/2028   Colon Cancer Screening  02/05/2028   Hepatitis C Screening  Completed   HPV Vaccine  Aged Out   DTaP/Tdap/Td vaccine  Discontinued  *Topic was postponed. The date shown is not the original due date.    Follow up with PCP if no improvement  If you have any questions or concerns, please do not hesitate to call the office at [phone].  I look forward to our next visit and until then take care and stay safe.  Regards,   Dana Allan, MD   [facility]

## 2023-11-12 NOTE — Progress Notes (Unsigned)
SUBJECTIVE:   Chief Complaint  Patient presents with   Ear Pain    Since Dec 9, [address]   HPI Presents to clinic for acute visit  Discussed the use of AI scribe software for clinical note transcription with the patient, who gave verbal consent to proceed.  History of Present Illness The patient, who recently returned from a trip to [place], began experiencing body aches and chills followed by bilateral ear congestion. The patient reports that the ear congestion has persisted, despite initial feelings of improvement. The patient denies any associated cough, runny nose, or sore throat, which were initially present but have since resolved. The patient has noticed a decrease in hearing, often asking others to repeat herself. The patient denies any ear drainage, pain, or ringing. The patient also denies any chest discomfort or shortness of breath. The patient does not use earbuds or Q-tips regularly. The patient denies any issues with TMJ or any clicking sensation when chewing. The patient's medications include Crestor and Tamoxifen. The patient denies any known allergies.    PERTINENT PMH / PSH: As above  OBJECTIVE:  BP 120/76   Pulse 69   Temp 98 F (36.7 C)   Resp 18   Ht 4\' 11"  (1.499 m)   Wt 173 lb 2 oz (78.5 kg)   LMP 06/22/2015 (Approximate)   SpO2 100%   BMI 34.97 kg/m    Physical Exam Vitals reviewed.  Constitutional:      General: She is not in acute distress.    Appearance: Normal appearance. She is normal weight. She is not ill-appearing, toxic-appearing or diaphoretic.  HENT:     Right Ear: Ear canal and external ear normal. No decreased hearing noted. Tenderness present. No drainage. A middle ear effusion is present. Tympanic membrane is erythematous.     Left Ear: Ear canal and external ear normal. No decreased hearing noted. Tenderness present. No drainage. A middle ear effusion is present. Tympanic membrane is erythematous.  Eyes:     General:        Right  eye: No discharge.        Left eye: No discharge.     Conjunctiva/sclera: Conjunctivae normal.  Cardiovascular:     Rate and Rhythm: Normal rate.  Pulmonary:     Effort: Pulmonary effort is normal.  Musculoskeletal:        General: Normal range of motion.  Skin:    General: Skin is warm and dry.  Neurological:     General: No focal deficit present.     Mental Status: She is alert and oriented to person, place, and time. Mental status is at baseline.  Psychiatric:        Mood and Affect: Mood normal.        Behavior: Behavior normal.        Thought Content: Thought content normal.        Judgment: Judgment normal.        11/12/2023   11:46 AM 08/06/2023    7:14 AM 11/27/2022    7:32 AM 07/17/2022    7:44 AM 03/15/2022    8:14 AM  Depression screen PHQ 2/9  Decreased Interest 0 0 0 0 0  Down, Depressed, Hopeless 0 0 0 0 0  PHQ - 2 Score 0 0 0 0 0  Altered sleeping 0 0     Tired, decreased energy 0 0     Change in appetite 0 0     Feeling bad or failure  about yourself  0 0     Trouble concentrating 0 0     Moving slowly or fidgety/restless 0 0     Suicidal thoughts 0 0     PHQ-9 Score 0 0     Difficult doing work/chores Not difficult at all Not difficult at all         11/12/2023   11:46 AM 08/06/2023    7:14 AM  GAD 7 : Generalized Anxiety Score  Nervous, Anxious, on Edge 0 0  Control/stop worrying 0 0  Worry too much - different things 0 0  Trouble relaxing 0 0  Restless 0 0  Easily annoyed or irritable 0 0  Afraid - awful might happen 0 0  Total GAD 7 Score 0 0  Anxiety Difficulty Not difficult at all Not difficult at all    ASSESSMENT/PLAN:  Bilateral otitis media, unspecified otitis media type Assessment & Plan: History of recent viral infection with subsequent bilateral ear fullness and decreased hearing. No pain, drainage, or tinnitus. Otoscopic exam reveals fluid behind both tympanic membranes. -Prescribe Amoxicillin-Clavulanate for 5 days. -Recommend  over-the-counter decongestant. -Advise use of Flonase or saline nasal mist to promote drainage. -Recommend probiotic daily for 14 days to prevent potential antibiotic-associated yeast infection. -Check-in via MyChart in a few days to assess response to treatment.   Orders: -     Amoxicillin-Pot Clavulanate; Take 1 tablet by mouth 2 (two) times daily for 5 days.  Dispense: 10 tablet; Refill: 0 -     Saccharomyces boulardii; Take 1 capsule (250 mg total) by mouth daily.  Dispense: 90 capsule; Refill: 0 -     Fluticasone Propionate; Place 2 sprays into both nostrils daily.  Dispense: 16 g; Refill: 6    PDMP reviewed  Return if symptoms worsen or fail to improve, for PCP.  Dana Allan, MD

## 2023-11-15 ENCOUNTER — Encounter: Payer: Self-pay | Admitting: Family Medicine

## 2023-11-15 DIAGNOSIS — H6693 Otitis media, unspecified, bilateral: Secondary | ICD-10-CM | POA: Insufficient documentation

## 2023-11-15 NOTE — Assessment & Plan Note (Signed)
History of recent viral infection with subsequent bilateral ear fullness and decreased hearing. No pain, drainage, or tinnitus. Otoscopic exam reveals fluid behind both tympanic membranes. -Prescribe Amoxicillin-Clavulanate for 5 days. -Recommend over-the-counter decongestant. -Advise use of Flonase or saline nasal mist to promote drainage. -Recommend probiotic daily for 14 days to prevent potential antibiotic-associated yeast infection. -Check-in via MyChart in a few days to assess response to treatment.

## 2023-11-16 ENCOUNTER — Encounter: Payer: Self-pay | Admitting: Family Medicine

## 2023-11-18 ENCOUNTER — Other Ambulatory Visit: Payer: Self-pay | Admitting: Family Medicine

## 2023-11-18 DIAGNOSIS — H6693 Otitis media, unspecified, bilateral: Secondary | ICD-10-CM

## 2023-11-18 MED ORDER — PREDNISONE 20 MG PO TABS: 20.0000 mg | ORAL_TABLET | Freq: Every day | ORAL | 0 refills | Status: AC

## 2023-12-07 ENCOUNTER — Ambulatory Visit (INDEPENDENT_AMBULATORY_CARE_PROVIDER_SITE_OTHER): Payer: 59 | Admitting: Internal Medicine

## 2023-12-07 ENCOUNTER — Encounter: Payer: Self-pay | Admitting: Internal Medicine

## 2023-12-07 ENCOUNTER — Inpatient Hospital Stay: Payer: 59 | Attending: Hematology and Oncology | Admitting: Hematology and Oncology

## 2023-12-07 VITALS — BP 148/76 | HR 71 | Temp 97.7°F | Resp 17 | Wt 173.6 lb

## 2023-12-07 VITALS — BP 118/70 | HR 85 | Temp 98.0°F | Resp 16 | Ht 59.0 in | Wt 172.6 lb

## 2023-12-07 DIAGNOSIS — M25562 Pain in left knee: Secondary | ICD-10-CM

## 2023-12-07 DIAGNOSIS — E785 Hyperlipidemia, unspecified: Secondary | ICD-10-CM | POA: Insufficient documentation

## 2023-12-07 DIAGNOSIS — Z79899 Other long term (current) drug therapy: Secondary | ICD-10-CM | POA: Diagnosis not present

## 2023-12-07 DIAGNOSIS — N6099 Unspecified benign mammary dysplasia of unspecified breast: Secondary | ICD-10-CM | POA: Diagnosis present

## 2023-12-07 DIAGNOSIS — N6092 Unspecified benign mammary dysplasia of left breast: Secondary | ICD-10-CM | POA: Diagnosis not present

## 2023-12-07 DIAGNOSIS — N950 Postmenopausal bleeding: Secondary | ICD-10-CM | POA: Diagnosis not present

## 2023-12-07 DIAGNOSIS — Z0000 Encounter for general adult medical examination without abnormal findings: Secondary | ICD-10-CM

## 2023-12-07 DIAGNOSIS — Z79810 Long term (current) use of selective estrogen receptor modulators (SERMs): Secondary | ICD-10-CM | POA: Diagnosis not present

## 2023-12-07 DIAGNOSIS — E7800 Pure hypercholesterolemia, unspecified: Secondary | ICD-10-CM

## 2023-12-07 DIAGNOSIS — G4733 Obstructive sleep apnea (adult) (pediatric): Secondary | ICD-10-CM

## 2023-12-07 DIAGNOSIS — H938X3 Other specified disorders of ear, bilateral: Secondary | ICD-10-CM | POA: Insufficient documentation

## 2023-12-07 LAB — HEPATIC FUNCTION PANEL
ALT: 16 U/L (ref 0–35)
AST: 17 U/L (ref 0–37)
Albumin: 4.1 g/dL (ref 3.5–5.2)
Alkaline Phosphatase: 50 U/L (ref 39–117)
Bilirubin, Direct: 0 mg/dL (ref 0.0–0.3)
Total Bilirubin: 0.3 mg/dL (ref 0.2–1.2)
Total Protein: 7.4 g/dL (ref 6.0–8.3)

## 2023-12-07 LAB — BASIC METABOLIC PANEL
BUN: 10 mg/dL (ref 6–23)
CO2: 25 meq/L (ref 19–32)
Calcium: 9.3 mg/dL (ref 8.4–10.5)
Chloride: 106 meq/L (ref 96–112)
Creatinine, Ser: 0.47 mg/dL (ref 0.40–1.20)
GFR: 103.71 mL/min (ref >60.00)
Glucose, Bld: 94 mg/dL (ref 70–99)
Potassium: 3.8 meq/L (ref 3.5–5.1)
Sodium: 140 meq/L (ref 135–145)

## 2023-12-07 LAB — CBC WITH DIFFERENTIAL/PLATELET
Basophils Absolute: 0.1 10*3/uL (ref 0.0–0.1)
Basophils Relative: 1 % (ref 0.0–3.0)
Eosinophils Absolute: 0 10*3/uL (ref 0.0–0.7)
Eosinophils Relative: 0.4 % (ref 0.0–5.0)
HCT: 42.5 % (ref 36.0–46.0)
Hemoglobin: 13.7 g/dL (ref 12.0–15.0)
Lymphocytes Relative: 20.1 % (ref 12.0–46.0)
Lymphs Abs: 1.9 10*3/uL (ref 0.7–4.0)
MCHC: 32.3 g/dL (ref 30.0–36.0)
MCV: 87.8 fL (ref 78.0–100.0)
Monocytes Absolute: 0.6 10*3/uL (ref 0.1–1.0)
Monocytes Relative: 6.1 % (ref 3.0–12.0)
Neutro Abs: 6.8 10*3/uL (ref 1.4–7.7)
Neutrophils Relative %: 72.4 % (ref 43.0–77.0)
Platelets: 295 10*3/uL (ref 150.0–400.0)
RBC: 4.84 Mil/uL (ref 3.87–5.11)
RDW: 14.1 % (ref 11.5–15.5)
WBC: 9.4 10*3/uL (ref 4.0–10.5)

## 2023-12-07 LAB — LIPID PANEL
Cholesterol: 195 mg/dL (ref 0–200)
HDL: 54.3 mg/dL (ref >39.00)
LDL Cholesterol: 126 mg/dL — ABNORMAL HIGH (ref 0–99)
NonHDL: 140.86
Total CHOL/HDL Ratio: 4
Triglycerides: 75 mg/dL (ref 0.0–149.0)
VLDL: 15 mg/dL (ref 0.0–40.0)

## 2023-12-07 LAB — TSH: TSH: 0.95 u[IU]/mL (ref 0.35–5.50)

## 2023-12-07 NOTE — Assessment & Plan Note (Signed)
Saw gyn for the PMB.  Benign endometrial biopsy, shows benign endometrial polyp. No evidence of precancerous or cancerous cells. Recommended to continue to follow. No report of further vaginal bleeding.

## 2023-12-07 NOTE — Assessment & Plan Note (Signed)
Persistent knee pain.  Saw [facility] 01/2023 - injection (cortisone).  Mobic.  Request refill mobic.  Persistent intermittent pain as outlined.  Limiting activity.  Previous xray - per her report - arthritis.  Referred to PT for further evaluation and treatment.  She started an exercise program and this is helping.  Follow.

## 2023-12-07 NOTE — Assessment & Plan Note (Signed)
CPAP.  

## 2023-12-07 NOTE — Assessment & Plan Note (Signed)
Is s/p left breast lumpectomy with radioactive seed localization for atypical ductal hyperplasia. (12/14/22)   Saw oncology 01/10/23 - started on tamoxifen.had f/u 03/2023. On 1/2 dose now and tolerating.  Follow.

## 2023-12-07 NOTE — Progress Notes (Signed)
Subjective:    Patient ID: Alison Rodriguez, female    DOB: 1961-03-19, 63 y.o.   MRN: [id number]  Patient here for  Chief Complaint  Patient presents with   Annual Exam    HPI Here for a physical exam. Recently evaluated/treated for bilateral otitis media - treated with augmentin/flonase. No improvement.  Prednisone added. Saw ENT. Placed on longer course of prednisone and nasal spray. Persistent issues with her right ear. Some better.  Has f/u planned with ENT. Recently diagnosed with atypical ductal hyperplasia.  S/p lumpectomy. On tamoxifen. Saw gyn for the PMB. Benign endometrial biopsy, shows benign endometrial polyp. No evidence of precancerous or cancerous cells. Recommended to continue to follow. Using cpap. Previously knee pain. Xray - arthritis changes. Had discussed PT and referred last visit.  She started an exercise class a couple of weeks ago and feels this is helping her knee. Also restarted prilosec last visit. She is not having frequent issues. Takes prilosec prn - but not often. Some itching - abdomen. Used aquaphor this am.     Past Medical History:  Diagnosis Date   Blood in stool    H/O   Hyperlipidemia    Hypertension    Sleep apnea    Thyroid disease    Past Surgical History:  Procedure Laterality Date   BREAST BIOPSY Left 10/06/2022   MM LT BREAST BX W LOC DEV 1ST LESION IMAGE BX SPEC STEREO GUIDE 10/06/2022 [facility]   BREAST BIOPSY  12/13/2022   MM LT RADIOACTIVE SEED LOC MAMMO GUIDE 12/13/2022 [facility]   BREAST LUMPECTOMY WITH RADIOACTIVE SEED LOCALIZATION Left 12/14/2022   Procedure: LEFT BREAST LUMPECTOMY WITH RADIOACTIVE SEED LOCALIZATION;  Surgeon: Griselda Miner, MD;  Location: [facility];  Service: General;  Laterality: Left;   COLONOSCOPY WITH PROPOFOL N/A 02/04/2021   Procedure: COLONOSCOPY WITH PROPOFOL;  Surgeon: Midge Minium, MD;  Location: [facility];  Service: Endoscopy;  Laterality: N/A;  priority 4    POLYPECTOMY  02/04/2021   Procedure: POLYPECTOMY;  Surgeon: Midge Minium, MD;  Location: [facility];  Service: Endoscopy;;   UTERINE FIBROID EMBOLIZATION     Family History  Problem Relation Age of Onset   Diabetes Mother    Diabetes Maternal Grandmother    Arthritis Maternal Grandfather    Diabetes Maternal Grandfather    Breast cancer Other        second cousins/aunt   Social History   Socioeconomic History   Marital status: Married    Spouse name: Not on file   Number of children: Not on file   Years of education: Not on file   Highest education level: Bachelor's degree (e.g., BA, AB, BS)  Occupational History   Not on file  Tobacco Use   Smoking status: Never    Passive exposure: Yes   Smokeless tobacco: Never  Substance and Sexual Activity   Alcohol use: No    Alcohol/week: 0.0 standard drinks of alcohol   Drug use: No   Sexual activity: Not on file  Other Topics Concern   Not on file  Social History Narrative   Not on file   Social Drivers of Health   Financial Resource Strain: Low Risk  (11/12/2023)   Overall Financial Resource Strain (CARDIA)    Difficulty of Paying Living Expenses: Not hard at all  Food Insecurity: No Food Insecurity (11/12/2023)   Hunger Vital Sign    Worried About Running Out of Food in the Last Year: Never  true    Ran Out of Food in the Last Year: Never true  Transportation Needs: No Transportation Needs (11/12/2023)   PRAPARE - Administrator, Civil Service (Medical): No    Lack of Transportation (Non-Medical): No  Physical Activity: Inactive (11/12/2023)   Exercise Vital Sign    Days of Exercise per Week: 0 days    Minutes of Exercise per Session: 30 min  Stress: No Stress Concern Present (11/12/2023)   [employer] of Occupational Health - Occupational Stress Questionnaire    Feeling of Stress : Not at all  Social Connections: Socially Integrated (11/12/2023)   Social Connection and Isolation Panel  [NHANES]    Frequency of Communication with Friends and Family: Twice a week    Frequency of Social Gatherings with Friends and Family: Once a week    Attends Religious Services: More than 4 times per year    Active Member of [employer] or Organizations: Yes    Attends Engineer, structural: More than 4 times per year    Marital Status: Married     Review of Systems  Constitutional:  Negative for appetite change and unexpected weight change.  HENT:  Negative for congestion, sinus pressure and sore throat.        No increased congestion. Right ear fullness persists.   Eyes:  Negative for pain and visual disturbance.  Respiratory:  Negative for cough, chest tightness and shortness of breath.   Cardiovascular:  Negative for chest pain and palpitations.  Gastrointestinal:  Negative for abdominal pain, diarrhea, nausea and vomiting.  Genitourinary:  Negative for difficulty urinating and dysuria.  Musculoskeletal:  Negative for joint swelling and myalgias.  Skin:  Negative for color change and rash.  Neurological:  Negative for dizziness and headaches.  Hematological:  Negative for adenopathy. Does not bruise/bleed easily.  Psychiatric/Behavioral:  Negative for decreased concentration and dysphoric mood.        Objective:     BP 118/70   Pulse 85   Temp 98 F (36.7 C)   Resp 16   Ht 4\' 11"  (1.499 m)   Wt 172 lb 9.6 oz (78.3 kg)   LMP 06/22/2015 (Approximate)   SpO2 98%   BMI 34.86 kg/m  Wt Readings from Last 3 Encounters:  12/07/23 172 lb 9.6 oz (78.3 kg)  11/12/23 173 lb 2 oz (78.5 kg)  08/06/23 169 lb (76.7 kg)    Physical Exam Vitals reviewed.  Constitutional:      General: She is not in acute distress.    Appearance: Normal appearance. She is well-developed.  HENT:     Head: Normocephalic and atraumatic.     Right Ear: External ear normal.     Left Ear: External ear normal.     Mouth/Throat:     Pharynx: No oropharyngeal exudate or posterior oropharyngeal  erythema.  Eyes:     General: No scleral icterus.       Right eye: No discharge.        Left eye: No discharge.     Conjunctiva/sclera: Conjunctivae normal.  Neck:     Thyroid: No thyromegaly.  Cardiovascular:     Rate and Rhythm: Normal rate and regular rhythm.  Pulmonary:     Effort: No tachypnea, accessory muscle usage or respiratory distress.     Breath sounds: Normal breath sounds. No decreased breath sounds or wheezing.  Chest:  Breasts:    Right: No inverted nipple, mass, nipple discharge or tenderness (no axillary adenopathy).  Left: No inverted nipple, mass, nipple discharge or tenderness (no axilarry adenopathy).  Abdominal:     General: Bowel sounds are normal.     Palpations: Abdomen is soft.     Tenderness: There is no abdominal tenderness.  Musculoskeletal:        General: No swelling or tenderness.     Cervical back: Neck supple.  Lymphadenopathy:     Cervical: No cervical adenopathy.  Skin:    Findings: No erythema or rash.  Neurological:     Mental Status: She is alert and oriented to person, place, and time.  Psychiatric:        Mood and Affect: Mood normal.        Behavior: Behavior normal.         Outpatient Encounter Medications as of 12/07/2023  Medication Sig   fluticasone (FLONASE) 50 MCG/ACT nasal spray Place 2 sprays into both nostrils daily.   gabapentin (NEURONTIN) 100 MG capsule Take 1 capsule (100 mg total) by mouth at bedtime. (Patient not taking: Reported on 11/12/2023)   rosuvastatin (CRESTOR) 10 MG tablet Take 1 tablet (10 mg total) by mouth daily.   saccharomyces boulardii (FLORASTOR) 250 MG capsule Take 1 capsule (250 mg total) by mouth daily.   tamoxifen (NOLVADEX) 10 MG tablet TAKE 1 TABLET BY MOUTH EVERY DAY   [DISCONTINUED] meloxicam (MOBIC) 15 MG tablet Take 1 tablet (15 mg total) by mouth daily as needed. (Patient not taking: Reported on 11/12/2023)   No facility-administered encounter medications on file as of 12/07/2023.      Lab Results  Component Value Date   WBC 6.0 11/27/2022   HGB 14.3 11/27/2022   HCT 43.8 11/27/2022   PLT 306.0 11/27/2022   GLUCOSE 92 08/06/2023   CHOL 162 08/06/2023   TRIG 78.0 08/06/2023   HDL 49.10 08/06/2023   LDLCALC 97 08/06/2023   ALT 14 08/06/2023   AST 16 08/06/2023   NA 140 08/06/2023   K 3.8 08/06/2023   CL 107 08/06/2023   CREATININE 0.57 08/06/2023   BUN 10 08/06/2023   CO2 25 08/06/2023   TSH 1.72 11/27/2022    MM 3D SCREEN BREAST BILATERAL Result Date: 09/05/2023 CLINICAL DATA:  Screening. EXAM: DIGITAL SCREENING BILATERAL MAMMOGRAM WITH TOMOSYNTHESIS AND CAD TECHNIQUE: Bilateral screening digital craniocaudal and mediolateral oblique mammograms were obtained. Bilateral screening digital breast tomosynthesis was performed. The images were evaluated with computer-aided detection. COMPARISON:  Previous exam(s). ACR Breast Density Category c: The breasts are heterogeneously dense, which may obscure small masses. FINDINGS: There are no findings suspicious for malignancy. IMPRESSION: No mammographic evidence of malignancy. A result letter of this screening mammogram will be mailed directly to the patient. RECOMMENDATION: Screening mammogram in one year. (Code:SM-B-01Y) BI-RADS CATEGORY  1: Negative. Electronically Signed   By: Frederico Hamman M.D.   On: 09/05/2023 14:05       Assessment & Plan:  Routine general medical examination at a health care facility  Health care maintenance Assessment & Plan: Physical today 12/07/23.  Saw Dr Macon Large PAP 12/2022 - Atypical squamous cells seen on pap but with negative HPV. Recommended repeat pap and HPV test in three years  Mammogram 09/05/23 - Birads I. Colonoscopy 02/04/21.  Recommended f/u in 7 years.     Hypercholesterolemia Assessment & Plan: Continue crestor.  Low cholesterol diet and exercise.  Follow lipid panel and liver function tests.   Orders: -     CBC with Differential/Platelet -     Hepatic function  panel -  Basic metabolic panel -     Lipid panel -     TSH  Postmenopausal bleeding Assessment & Plan: Saw gyn for the PMB.  Benign endometrial biopsy, shows benign endometrial polyp. No evidence of precancerous or cancerous cells. Recommended to continue to follow. No report of further vaginal bleeding.    OSA (obstructive sleep apnea) Assessment & Plan: CPAP.    Left knee pain, unspecified chronicity Assessment & Plan: Persistent knee pain.  Saw [facility] 01/2023 - injection (cortisone).  Mobic.  Request refill mobic.  Persistent intermittent pain as outlined.  Limiting activity.  Previous xray - per her report - arthritis.  Referred to PT for further evaluation and treatment.  She started an exercise program and this is helping.  Follow.    Atypical ductal hyperplasia of breast Assessment & Plan: Is s/p left breast lumpectomy with radioactive seed localization for atypical ductal hyperplasia. (12/14/22)   Saw oncology 01/10/23 - started on tamoxifen.had f/u 03/2023. On 1/2 dose now and tolerating.  Follow.    Ear fullness, bilateral Assessment & Plan: Saw ENT. Left ear better. Right ear fullness persist. On prednisone. Restart the nasal spray. Keep f/u with ENT.       [place], MD

## 2023-12-07 NOTE — Assessment & Plan Note (Signed)
Physical today 12/07/23.  Saw Dr Macon Large PAP 12/2022 - Atypical squamous cells seen on pap but with negative HPV. Recommended repeat pap and HPV test in three years  Mammogram 09/05/23 - Birads I. Colonoscopy 02/04/21.  Recommended f/u in 7 years.

## 2023-12-07 NOTE — Progress Notes (Signed)
[facility] CONSULT NOTE  Patient Care Team: Dale Merna, MD as PCP - General (Internal Medicine)  CHIEF COMPLAINTS/PURPOSE OF CONSULTATION:  ADH  ASSESSMENT & PLAN:   This is a very pleasant 63 year old postmenopausal female patient with newly diagnosed atypical ductal hyperplasia on tamoxifen here for follow up.  Breast Cancer On Tamoxifen 5mg  daily. No joint pain reported. Mammogram from October was normal. -Continue Tamoxifen 5mg  daily. -Next mammogram due in October 2025.  Hyperlipidemia On Crestor. Reports itching but unsure if related to medication. -Consider holding Crestor to see if itching improves.  Weight Management Patient reports desire to lose weight and has started exercising. -Encourage continued exercise and healthy diet.  General Health Maintenance -Continue current medications. -Annual follow-up or sooner if any issues arise.  HISTORY OF PRESENTING ILLNESS:  Alison Rodriguez 63 y.o. female is here because of ADH Discussed the use of AI scribe software for clinical note transcription with the patient, who gave verbal consent to proceed.  History of Present Illness     The patient, with a history of ADH on tamoxifen and hyperlipidemia on Crestor, presents with generalized itching. She denies any new medications and has been on both tamoxifen and Crestor for some time. She has consulted with her primary care physician regarding this issue.  In addition, the patient reports a significant improvement in joint pain since reducing the dose of tamoxifen to 5mg . She also reports a recent diagnosis of fluid in the ear, for which she has been prescribed a two-week course of prednisone.  The patient has also started taking an herbal supplement, which she reports has helped to reduce her appetite and improve her sleep. She denies any new symptoms since starting the supplement.  Finally, the patient expresses a desire to lose weight and increase her  level of physical activity. She acknowledges the challenge of doing so, particularly while on prednisone.  MEDICAL HISTORY:  Past Medical History:  Diagnosis Date   Blood in stool    H/O   Hyperlipidemia    Hypertension    Sleep apnea    Thyroid disease     SURGICAL HISTORY: Past Surgical History:  Procedure Laterality Date   BREAST BIOPSY Left 10/06/2022   MM LT BREAST BX W LOC DEV 1ST LESION IMAGE BX SPEC STEREO GUIDE 10/06/2022 [facility]   BREAST BIOPSY  12/13/2022   MM LT RADIOACTIVE SEED LOC MAMMO GUIDE 12/13/2022 [facility]   BREAST LUMPECTOMY WITH RADIOACTIVE SEED LOCALIZATION Left 12/14/2022   Procedure: LEFT BREAST LUMPECTOMY WITH RADIOACTIVE SEED LOCALIZATION;  Surgeon: Griselda Miner, MD;  Location: [facility];  Service: General;  Laterality: Left;   COLONOSCOPY WITH PROPOFOL N/A 02/04/2021   Procedure: COLONOSCOPY WITH PROPOFOL;  Surgeon: Midge Minium, MD;  Location: [facility];  Service: Endoscopy;  Laterality: N/A;  priority 4   POLYPECTOMY  02/04/2021   Procedure: POLYPECTOMY;  Surgeon: Midge Minium, MD;  Location: [facility];  Service: Endoscopy;;   UTERINE FIBROID EMBOLIZATION      SOCIAL HISTORY: Social History   Socioeconomic History   Marital status: Married    Spouse name: Not on file   Number of children: Not on file   Years of education: Not on file   Highest education level: Bachelor's degree (e.g., BA, AB, BS)  Occupational History   Not on file  Tobacco Use   Smoking status: Never    Passive exposure: Yes   Smokeless tobacco: Never  Substance and Sexual Activity  Alcohol use: No    Alcohol/week: 0.0 standard drinks of alcohol   Drug use: No   Sexual activity: Not on file  Other Topics Concern   Not on file  Social History Narrative   Not on file   Social Drivers of Health   Financial Resource Strain: Low Risk  (11/12/2023)   Overall Financial Resource Strain (CARDIA)    Difficulty of  Paying Living Expenses: Not hard at all  Food Insecurity: No Food Insecurity (11/12/2023)   Hunger Vital Sign    Worried About Running Out of Food in the Last Year: Never true    Ran Out of Food in the Last Year: Never true  Transportation Needs: No Transportation Needs (11/12/2023)   PRAPARE - Administrator, Civil Service (Medical): No    Lack of Transportation (Non-Medical): No  Physical Activity: Inactive (11/12/2023)   Exercise Vital Sign    Days of Exercise per Week: 0 days    Minutes of Exercise per Session: 30 min  Stress: No Stress Concern Present (11/12/2023)   [employer] of Occupational Health - Occupational Stress Questionnaire    Feeling of Stress : Not at all  Social Connections: Socially Integrated (11/12/2023)   Social Connection and Isolation Panel [NHANES]    Frequency of Communication with Friends and Family: Twice a week    Frequency of Social Gatherings with Friends and Family: Once a week    Attends Religious Services: More than 4 times per year    Active Member of [employer] or Organizations: Yes    Attends Engineer, structural: More than 4 times per year    Marital Status: Married  Catering manager Violence: Unknown (02/22/2022)   Received from [employer], [facility]   HITS    Physically Hurt: Not on file    Insult or Talk Down To: Not on file    Threaten Physical Harm: Not on file    Scream or Curse: Not on file    FAMILY HISTORY: Family History  Problem Relation Age of Onset   Diabetes Mother    Diabetes Maternal Grandmother    Arthritis Maternal Grandfather    Diabetes Maternal Grandfather    Breast cancer Other        second cousins/aunt    ALLERGIES:  is allergic to peanut-containing drug products.  MEDICATIONS:  Current Outpatient Medications  Medication Sig Dispense Refill   fluticasone (FLONASE) 50 MCG/ACT nasal spray Place 2 sprays into both nostrils daily. 16 g 6   gabapentin (NEURONTIN) 100 MG capsule Take  1 capsule (100 mg total) by mouth at bedtime. (Patient not taking: Reported on 11/12/2023) 60 capsule 1   rosuvastatin (CRESTOR) 10 MG tablet Take 1 tablet (10 mg total) by mouth daily. 30 tablet 11   saccharomyces boulardii (FLORASTOR) 250 MG capsule Take 1 capsule (250 mg total) by mouth daily. 90 capsule 0   tamoxifen (NOLVADEX) 10 MG tablet TAKE 1 TABLET BY MOUTH EVERY DAY 30 tablet 5   No current facility-administered medications for this visit.     PHYSICAL EXAMINATION: ECOG PERFORMANCE STATUS: 0 - Asymptomatic  Vitals:   12/07/23 0944  BP: (!) 148/76  Pulse: 71  Resp: 17  Temp: 97.7 F (36.5 C)  SpO2: 100%    Filed Weights   12/07/23 0944  Weight: 173 lb 9.6 oz (78.7 kg)   Physical Exam Constitutional:      Appearance: Normal appearance.  Chest:     Comments: Bilateral breasts inspected. NO  palpable masses or regional adenopathy. Musculoskeletal:     Cervical back: Normal range of motion. No rigidity.  Lymphadenopathy:     Cervical: No cervical adenopathy.  Neurological:     Mental Status: She is alert.      LABORATORY DATA:  I have reviewed the data as listed Lab Results  Component Value Date   WBC 6.0 11/27/2022   HGB 14.3 11/27/2022   HCT 43.8 11/27/2022   MCV 89.1 11/27/2022   PLT 306.0 11/27/2022     Chemistry      Component Value Date/Time   NA 140 08/06/2023 0743   NA 139 12/08/2014 1713   K 3.8 08/06/2023 0743   K 4.1 12/08/2014 1713   CL 107 08/06/2023 0743   CL 104 12/08/2014 1713   CO2 25 08/06/2023 0743   CO2 28 12/08/2014 1713   BUN 10 08/06/2023 0743   BUN 11 12/08/2014 1713   CREATININE 0.57 08/06/2023 0743   CREATININE 0.63 12/08/2014 1713      Component Value Date/Time   CALCIUM 9.2 08/06/2023 0743   CALCIUM 8.8 12/08/2014 1713   ALKPHOS 50 08/06/2023 0743   ALKPHOS 68 12/08/2014 1713   AST 16 08/06/2023 0743   AST 25 12/08/2014 1713   ALT 14 08/06/2023 0743   ALT 28 12/08/2014 1713   BILITOT 0.3 08/06/2023 0743    BILITOT 0.3 12/08/2014 1713       RADIOGRAPHIC STUDIES: I have personally reviewed the radiological images as listed and agreed with the findings in the report. No results found.  All questions were answered. The patient knows to call the clinic with any problems, questions or concerns. I spent 20 minutes in the care of this patient including H and P, review of records, counseling and coordination of care.     Rachel Moulds, MD 12/07/2023 10:03 AM

## 2023-12-07 NOTE — Assessment & Plan Note (Signed)
Continue crestor.  Low cholesterol diet and exercise.  Follow lipid panel and liver function tests.  

## 2023-12-07 NOTE — Assessment & Plan Note (Signed)
Saw ENT. Left ear better. Right ear fullness persist. On prednisone. Restart the nasal spray. Keep f/u with ENT.

## 2023-12-10 ENCOUNTER — Telehealth: Payer: Self-pay

## 2023-12-10 NOTE — Telephone Encounter (Signed)
Called patient to clarify that the prednisone did not increase her cholesterol.

## 2023-12-10 NOTE — Telephone Encounter (Signed)
Copied from CRM [id number]. Topic: Clinical - Lab/Test Results >> Dec 10, 2023  2:20 PM Irine Seal wrote: Reason for CRM: Pt returning missed call from Merlyn Lot, read note verbatim to pt, she declined the crestor increase to 20 mg and said she wants to stay on 10mg , however pt wants to know if the prednisone that she was taking could have altered her lab results    pt call back [phone]

## 2024-01-28 ENCOUNTER — Encounter: Payer: Self-pay | Admitting: *Deleted

## 2024-03-28 ENCOUNTER — Encounter (HOSPITAL_COMMUNITY): Payer: Self-pay

## 2024-03-28 ENCOUNTER — Other Ambulatory Visit: Payer: 59

## 2024-04-04 ENCOUNTER — Encounter: Payer: Self-pay | Admitting: Internal Medicine

## 2024-04-04 ENCOUNTER — Ambulatory Visit (INDEPENDENT_AMBULATORY_CARE_PROVIDER_SITE_OTHER): Payer: 59 | Admitting: Internal Medicine

## 2024-04-04 ENCOUNTER — Emergency Department

## 2024-04-04 ENCOUNTER — Emergency Department
Admission: EM | Admit: 2024-04-04 | Discharge: 2024-04-04 | Disposition: A | Discharge status: Home / Self Care | Attending: Emergency Medicine | Admitting: Emergency Medicine

## 2024-04-04 ENCOUNTER — Encounter
Admission: EM | Disposition: A | Discharge status: Home / Self Care | Payer: Self-pay | Source: Home / Self Care | Attending: Emergency Medicine

## 2024-04-04 ENCOUNTER — Other Ambulatory Visit: Payer: Self-pay

## 2024-04-04 VITALS — BP 128/74 | HR 100 | Resp 16 | Ht 59.0 in | Wt 170.0 lb

## 2024-04-04 DIAGNOSIS — E7800 Pure hypercholesterolemia, unspecified: Secondary | ICD-10-CM | POA: Diagnosis not present

## 2024-04-04 DIAGNOSIS — G4733 Obstructive sleep apnea (adult) (pediatric): Secondary | ICD-10-CM | POA: Diagnosis not present

## 2024-04-04 DIAGNOSIS — R0789 Other chest pain: Secondary | ICD-10-CM | POA: Diagnosis present

## 2024-04-04 DIAGNOSIS — R079 Chest pain, unspecified: Secondary | ICD-10-CM

## 2024-04-04 DIAGNOSIS — L501 Idiopathic urticaria: Secondary | ICD-10-CM | POA: Insufficient documentation

## 2024-04-04 DIAGNOSIS — I10 Essential (primary) hypertension: Secondary | ICD-10-CM | POA: Diagnosis not present

## 2024-04-04 DIAGNOSIS — I309 Acute pericarditis, unspecified: Secondary | ICD-10-CM | POA: Insufficient documentation

## 2024-04-04 DIAGNOSIS — R9431 Abnormal electrocardiogram [ECG] [EKG]: Secondary | ICD-10-CM

## 2024-04-04 LAB — CBC WITH DIFFERENTIAL/PLATELET
Abs Immature Granulocytes: 0.05 10*3/uL (ref 0.00–0.07)
Basophils Absolute: 0.1 10*3/uL (ref 0.0–0.1)
Basophils Relative: 0 %
Eosinophils Absolute: 0 10*3/uL (ref 0.0–0.5)
Eosinophils Relative: 0 %
HCT: 41.8 % (ref 36.0–46.0)
Hemoglobin: 13.1 g/dL (ref 12.0–15.0)
Immature Granulocytes: 0 %
Lymphocytes Relative: 16 %
Lymphs Abs: 2.2 10*3/uL (ref 0.7–4.0)
MCH: 27.8 pg (ref 26.0–34.0)
MCHC: 31.3 g/dL (ref 30.0–36.0)
MCV: 88.7 fL (ref 80.0–100.0)
Monocytes Absolute: 0.9 10*3/uL (ref 0.1–1.0)
Monocytes Relative: 6 %
Neutro Abs: 10.6 10*3/uL — ABNORMAL HIGH (ref 1.7–7.7)
Neutrophils Relative %: 78 %
Platelets: 324 10*3/uL (ref 150–400)
RBC: 4.71 MIL/uL (ref 3.87–5.11)
RDW: 13.3 % (ref 11.5–15.5)
WBC: 13.8 10*3/uL — ABNORMAL HIGH (ref 4.0–10.5)
nRBC: 0 % (ref 0.0–0.2)

## 2024-04-04 LAB — BASIC METABOLIC PANEL WITH GFR
Anion gap: 6 (ref 5–15)
BUN: 8 mg/dL (ref 6–20)
CO2: 25 mmol/L (ref 22–32)
Calcium: 8.8 mg/dL — ABNORMAL LOW (ref 8.9–10.3)
Chloride: 111 mmol/L (ref 98–111)
Creatinine, Ser: 0.52 mg/dL (ref 0.44–1.00)
GFR, Estimated: >60 mL/min (ref >60)
Glucose, Bld: 114 mg/dL — ABNORMAL HIGH (ref 70–99)
Potassium: 3.8 mmol/L (ref 3.5–5.1)
Sodium: 142 mmol/L (ref 135–145)

## 2024-04-04 LAB — SEDIMENTATION RATE: Sed Rate: 32 mm/h — ABNORMAL HIGH (ref 0–30)

## 2024-04-04 LAB — TROPONIN I (HIGH SENSITIVITY)
Troponin I (High Sensitivity): 6 ng/L (ref <18)
Troponin I (High Sensitivity): 5 ng/L (ref <18)

## 2024-04-04 LAB — C-REACTIVE PROTEIN: CRP: 6.2 mg/dL — ABNORMAL HIGH (ref <1.0)

## 2024-04-04 SURGERY — CORONARY/GRAFT ACUTE MI REVASCULARIZATION
Anesthesia: Moderate Sedation

## 2024-04-04 MED ORDER — IBUPROFEN 800 MG PO TABS: 800.0000 mg | ORAL_TABLET | Freq: Three times a day (TID) | ORAL | 0 refills | Status: AC

## 2024-04-04 MED ORDER — ASPIRIN 81 MG PO CHEW: 324.0000 mg | CHEWABLE_TABLET | Freq: Once | ORAL | Status: AC

## 2024-04-04 MED ORDER — NITROGLYCERIN 0.4 MG SL SUBL
0.4000 mg | SUBLINGUAL_TABLET | SUBLINGUAL | Status: AC | PRN
Start: 2024-04-04 — End: ?

## 2024-04-04 MED ORDER — KETOROLAC TROMETHAMINE 30 MG/ML IJ SOLN
15.0000 mg | Freq: Once | INTRAMUSCULAR | Status: AC
  Administered 2024-04-04: 15 mg via INTRAVENOUS
  Filled 2024-04-04: qty 1

## 2024-04-04 MED ORDER — COLCHICINE 0.6 MG PO TABS: 0.6000 mg | ORAL_TABLET | Freq: Two times a day (BID) | ORAL | 2 refills | Status: AC

## 2024-04-04 NOTE — ED Triage Notes (Signed)
 Pt here via [facility] from [facility] PCP witth CP. Pt states CP started last night at 1800 last night, pt states pain was a 10/10. Lebaurer gave 324 ASA, PTA, Pt also got 1 SL nitroglycerin  which relieved CP. Pt denies CP at this time. Pt endorses vomiting last night but denies SOB. Pt is A&Ox34. Pt denies cardiac HX.

## 2024-04-04 NOTE — Progress Notes (Signed)
 Subjective:    Patient ID: Alison Rodriguez, female    DOB: May 09, 1961, 63 y.o.   MRN: [id number]  Patient here for follow up appt.   HPI Here for a scheduled follow up - follow up regarding hypercholesterolemia and hypertension. Was evaluated 12/07/23 - f/u atypical ductal hyperplasia. On tamoxifen . Next mammogram 08/2024.  Previously saw gyn for the PMB. Benign endometrial biopsy, shows benign endometrial polyp. No evidence of precancerous or cancerous cells. Recommended to continue to follow. Using cpap. Taking prilosec. Reports that last night, starting around 6pm she started having chest pain.  Described the pain as chest pressure 9/10 pain. Continued through the night. She thought it was due to something she ate. Had one episode of vomiting. No abdominal pain. No further vomiting. No bowel change. Pain this am - 8-9/10.    Past Medical History:  Diagnosis Date   Blood in stool    H/O   Hyperlipidemia    Hypertension    Sleep apnea    Thyroid  disease    Past Surgical History:  Procedure Laterality Date   BREAST BIOPSY Left 10/06/2022   MM LT BREAST BX W LOC DEV 1ST LESION IMAGE BX SPEC STEREO GUIDE 10/06/2022 [facility]   BREAST BIOPSY  12/13/2022   MM LT RADIOACTIVE SEED LOC MAMMO GUIDE 12/13/2022 [facility]   BREAST LUMPECTOMY WITH RADIOACTIVE SEED LOCALIZATION Left 12/14/2022   Procedure: LEFT BREAST LUMPECTOMY WITH RADIOACTIVE SEED LOCALIZATION;  Surgeon: Caralyn Chandler, MD;  Location: [facility];  Service: General;  Laterality: Left;   COLONOSCOPY WITH PROPOFOL  N/A 02/04/2021   Procedure: COLONOSCOPY WITH PROPOFOL ;  Surgeon: Marnee Sink, MD;  Location: [facility];  Service: Endoscopy;  Laterality: N/A;  priority 4   POLYPECTOMY  02/04/2021   Procedure: POLYPECTOMY;  Surgeon: Marnee Sink, MD;  Location: [facility];  Service: Endoscopy;;   UTERINE FIBROID EMBOLIZATION     Family History  Problem Relation Age of Onset   Diabetes  Mother    Diabetes Maternal Grandmother    Arthritis Maternal Grandfather    Diabetes Maternal Grandfather    Breast cancer Other        second cousins/aunt   Social History   Socioeconomic History   Marital status: Married    Spouse name: Not on file   Number of children: Not on file   Years of education: Not on file   Highest education level: Bachelor's degree (e.g., BA, AB, BS)  Occupational History   Not on file  Tobacco Use   Smoking status: Never    Passive exposure: Yes   Smokeless tobacco: Never  Substance and Sexual Activity   Alcohol use: No    Alcohol/week: 0.0 standard drinks of alcohol   Drug use: No   Sexual activity: Not on file  Other Topics Concern   Not on file  Social History Narrative   Not on file   Social Drivers of Health   Financial Resource Strain: Low Risk  (11/12/2023)   Overall Financial Resource Strain (CARDIA)    Difficulty of Paying Living Expenses: Not hard at all  Food Insecurity: No Food Insecurity (11/12/2023)   Hunger Vital Sign    Worried About Running Out of Food in the Last Year: Never true    Ran Out of Food in the Last Year: Never true  Transportation Needs: No Transportation Needs (11/12/2023)   PRAPARE - Administrator, Civil Service (Medical): No    Lack of Transportation (Non-Medical):  No  Physical Activity: Inactive (11/12/2023)   Exercise Vital Sign    Days of Exercise per Week: 0 days    Minutes of Exercise per Session: 30 min  Stress: No Stress Concern Present (11/12/2023)   [employer] of Occupational Health - Occupational Stress Questionnaire    Feeling of Stress : Not at all  Social Connections: Socially Integrated (11/12/2023)   Social Connection and Isolation Panel [NHANES]    Frequency of Communication with Friends and Family: Twice a week    Frequency of Social Gatherings with Friends and Family: Once a week    Attends Religious Services: More than 4 times per year    Active Member of  [employer] or Organizations: Yes    Attends Engineer, structural: More than 4 times per year    Marital Status: Married     Review of Systems  Constitutional:  Negative for appetite change and unexpected weight change.  HENT:  Negative for congestion and sinus pressure.   Respiratory:  Positive for chest tightness. Negative for cough.        Some sob last night.   Cardiovascular:  Positive for chest pain. Negative for palpitations.       No increased swelling.   Gastrointestinal:  Negative for abdominal pain and diarrhea.       One episode of vomiting.   Genitourinary:  Negative for difficulty urinating and dysuria.  Musculoskeletal:  Negative for joint swelling and myalgias.  Skin:  Negative for color change and rash.  Neurological:  Negative for dizziness and headaches.  Psychiatric/Behavioral:  Negative for agitation and dysphoric mood.        Objective:     BP 128/74   Pulse 100   Resp 16   Ht 4\' 11"  (1.499 m)   Wt 170 lb (77.1 kg)   LMP 06/22/2015 (Approximate)   SpO2 99%   BMI 34.34 kg/m  Wt Readings from Last 3 Encounters:  04/04/24 170 lb (77.1 kg)  12/07/23 173 lb 9.6 oz (78.7 kg)  12/07/23 172 lb 9.6 oz (78.3 kg)    Physical Exam Vitals reviewed.  Constitutional:      General: She is not in acute distress.    Appearance: Normal appearance.  HENT:     Head: Normocephalic and atraumatic.     Right Ear: External ear normal.     Left Ear: External ear normal.  Eyes:     General: No scleral icterus.       Right eye: No discharge.        Left eye: No discharge.     Conjunctiva/sclera: Conjunctivae normal.  Neck:     Thyroid : No thyromegaly.  Cardiovascular:     Rate and Rhythm: Normal rate and regular rhythm.  Pulmonary:     Effort: No respiratory distress.     Breath sounds: Normal breath sounds. No wheezing.  Abdominal:     General: Bowel sounds are normal.     Palpations: Abdomen is soft.     Comments: Minimal tenderness palpation -  epigastric region.   Musculoskeletal:        General: No swelling or tenderness.     Cervical back: Neck supple. No tenderness.  Lymphadenopathy:     Cervical: No cervical adenopathy.  Skin:    Findings: No erythema or rash.  Neurological:     Mental Status: She is alert.  Psychiatric:        Mood and Affect: Mood normal.  Behavior: Behavior normal.         Outpatient Encounter Medications as of 04/04/2024  Medication Sig   fluticasone  (FLONASE ) 50 MCG/ACT nasal spray Place 2 sprays into both nostrils daily.   rosuvastatin  (CRESTOR ) 10 MG tablet Take 1 tablet (10 mg total) by mouth daily.   saccharomyces boulardii (FLORASTOR) 250 MG capsule Take 1 capsule (250 mg total) by mouth daily.   tamoxifen  (NOLVADEX ) 10 MG tablet TAKE 1 TABLET BY MOUTH EVERY DAY   [DISCONTINUED] gabapentin  (NEURONTIN ) 100 MG capsule Take 1 capsule (100 mg total) by mouth at bedtime. (Patient not taking: Reported on 11/12/2023)   Facility-Administered Encounter Medications as of 04/04/2024  Medication   [START ON 04/05/2024] aspirin  chewable tablet 324 mg   nitroGLYCERIN  (NITROSTAT ) SL tablet 0.4 mg     Lab Results  Component Value Date   WBC 9.4 12/07/2023   HGB 13.7 12/07/2023   HCT 42.5 12/07/2023   PLT 295.0 12/07/2023   GLUCOSE 94 12/07/2023   CHOL 195 12/07/2023   TRIG 75.0 12/07/2023   HDL 54.30 12/07/2023   LDLCALC 126 (H) 12/07/2023   ALT 16 12/07/2023   AST 17 12/07/2023   NA 140 12/07/2023   K 3.8 12/07/2023   CL 106 12/07/2023   CREATININE 0.47 12/07/2023   BUN 10 12/07/2023   CO2 25 12/07/2023   TSH 0.95 12/07/2023    MM 3D SCREEN BREAST BILATERAL Result Date: 09/05/2023 CLINICAL DATA:  Screening. EXAM: DIGITAL SCREENING BILATERAL MAMMOGRAM WITH TOMOSYNTHESIS AND CAD TECHNIQUE: Bilateral screening digital craniocaudal and mediolateral oblique mammograms were obtained. Bilateral screening digital breast tomosynthesis was performed. The images were evaluated with  computer-aided detection. COMPARISON:  Previous exam(s). ACR Breast Density Category c: The breasts are heterogeneously dense, which may obscure small masses. FINDINGS: There are no findings suspicious for malignancy. IMPRESSION: No mammographic evidence of malignancy. A result letter of this screening mammogram will be mailed directly to the patient. RECOMMENDATION: Screening mammogram in one year. (Code:SM-B-01Y) BI-RADS CATEGORY  1: Negative. Electronically Signed   By: Alinda Apley M.D.   On: 09/05/2023 14:05       Assessment & Plan:  Chest pain, unspecified type Assessment & Plan: 9/10 chest pain starting last night. Presented today with 8-9/10 chest pain. SOB last night. Denies sob today. Thought was due to something she ate. Took prilosec - no relief. Persistent chest pain. EKG - with ST elevation in II and minimal elevation avl. Was given 4 (81mg  aspirin ) and one SL NTG. Pain eased off. 4/10 after approximately 5 minutes. Discussed with pt regarding need for further w/up to confirm etiology. EMS called. Pt reported feeling much better after NTG and when EMS arrived.  To ER for further evaluation.  Blood pressure remained stable.  (114-128/70-90).   Orders: -     EKG 12-Lead -     Aspirin  -     Nitroglycerin   Hypercholesterolemia Assessment & Plan: Continue crestor .  Low cholesterol diet and exercise.    Orders: -     Basic metabolic panel with GFR; Future -     Hepatic function panel; Future -     Lipid panel; Future  OSA (obstructive sleep apnea) Assessment & Plan: CPAP.       Dellar Fenton, MD

## 2024-04-04 NOTE — ED Notes (Signed)
 Dr. Okey Dupre at bedside.

## 2024-04-04 NOTE — Assessment & Plan Note (Signed)
Continue crestor.  Low cholesterol diet and exercise.  

## 2024-04-04 NOTE — ED Notes (Signed)
 This RN responded to pt's call bell, pt states she is now having CP, repeat EKG obtained and printed and handed to Dr. Cleora Daft, pt states CP is a 4/10 and states she was siting and playing a game on her phone when it started.

## 2024-04-04 NOTE — Consult Note (Signed)
 Cardiology Consultation   Patient ID: Alison Rodriguez MRN: [id number]; DOB: [birth year]  Admit date: 04/04/2024 Date of Consult: 04/04/2024  PCP:  Dellar Fenton, MD   [facility] Providers Cardiologist:  None        Patient Profile:   Alison Rodriguez is a 63 y.o. female with a hx of hyperlipidemia, obstructive sleep apnea, and atypical ductal hyperplasia status postlumpectomy on tamoxifen , who is being seen 04/04/2024 for the evaluation of chest pain and abnormal EKG at the request of Dr. Cleora Daft.  History of Present Illness:   Alison Rodriguez reports that she was in her usual state of health until approximately 6 PM yesterday.  She suddenly developed central chest pressure without radiation that lasted through the night.  She went to the pharmacy yesterday evening and took some omeprazole which did not relieve the pain.  She went to her PCPs office this morning, where there was concern for acute MI.  She was given aspirin  and sublingual nitroglycerin  with prompt resolution of the pain.  She denies shortness of breath, palpitations, lightheadedness, edema, and fevers/chills.  In the ED, the patient is resting comfortably and denies any further chest pain.  She denies a history of prior heart disease.   Past Medical History:  Diagnosis Date   Blood in stool    H/O   Hyperlipidemia    Hypertension    Sleep apnea    Thyroid  disease     Past Surgical History:  Procedure Laterality Date   BREAST BIOPSY Left 10/06/2022   MM LT BREAST BX W LOC DEV 1ST LESION IMAGE BX SPEC STEREO GUIDE 10/06/2022 [facility]   BREAST BIOPSY  12/13/2022   MM LT RADIOACTIVE SEED LOC MAMMO GUIDE 12/13/2022 [facility]   BREAST LUMPECTOMY WITH RADIOACTIVE SEED LOCALIZATION Left 12/14/2022   Procedure: LEFT BREAST LUMPECTOMY WITH RADIOACTIVE SEED LOCALIZATION;  Surgeon: Caralyn Chandler, MD;  Location: [facility];  Service: General;  Laterality: Left;   COLONOSCOPY WITH  PROPOFOL  N/A 02/04/2021   Procedure: COLONOSCOPY WITH PROPOFOL ;  Surgeon: Marnee Sink, MD;  Location: [facility];  Service: Endoscopy;  Laterality: N/A;  priority 4   POLYPECTOMY  02/04/2021   Procedure: POLYPECTOMY;  Surgeon: Marnee Sink, MD;  Location: [facility];  Service: Endoscopy;;   UTERINE FIBROID EMBOLIZATION       Home Medications:  Prior to Admission medications   Medication Sig Start Date [place] Date Taking? Authorizing Provider  fluticasone  (FLONASE ) 50 MCG/ACT nasal spray Place 2 sprays into both nostrils daily. 11/12/23   Valli Gaw, MD  rosuvastatin  (CRESTOR ) 10 MG tablet Take 1 tablet (10 mg total) by mouth daily. 04/02/23   Dellar Fenton, MD  saccharomyces boulardii (FLORASTOR) 250 MG capsule Take 1 capsule (250 mg total) by mouth daily. 11/12/23   Valli Gaw, MD  tamoxifen  (NOLVADEX ) 10 MG tablet TAKE 1 TABLET BY MOUTH EVERY DAY 06/25/23   Iruku, Praveena, MD    Inpatient Medications: Scheduled Meds:  [START ON 04/05/2024] aspirin   324 mg Oral Once   Continuous Infusions:  PRN Meds: nitroGLYCERIN   Allergies:    Allergies  Allergen Reactions   Peanut-Containing Drug Products     Social History:   Social History   Tobacco Use   Smoking status: Never    Passive exposure: Yes   Smokeless tobacco: Never  Substance Use Topics   Alcohol use: No    Alcohol/week: 0.0 standard drinks of alcohol   Drug use: No  Family History:   Family History  Problem Relation Age of Onset   Diabetes Mother    Diabetes Maternal Grandmother    Arthritis Maternal Grandfather    Diabetes Maternal Grandfather    Breast cancer Other        second cousins/aunt     ROS:  Please see the history of present illness. All other ROS reviewed and negative.     Physical Exam/Data:   Vitals:   04/04/24 0839 04/04/24 0841  BP: 133/83   Pulse: 98   Resp: (!) 22   Temp: 98.6 F (37 C)   TempSrc: Oral   SpO2: 99%   Weight:  77.1 kg  Height:  4\' 11"   (1.499 m)   No intake or output data in the 24 hours ending 04/04/24 0948    04/04/2024    8:41 AM 04/04/2024    8:15 AM 12/07/2023    9:44 AM  Last 3 Weights  Weight (lbs) 170 lb 170 lb 173 lb 9.6 oz  Weight (kg) 77.111 kg 77.111 kg 78.744 kg     Body mass index is 34.34 kg/m.  General:  Well nourished, well developed, in no acute distress HEENT: normal Neck: no JVD Vascular: No carotid bruits; Distal pulses 2+ bilaterally Cardiac:  normal S1, S2; RRR; no murmurs, rubs, or gallops Lungs:  clear to auscultation bilaterally, no wheezing, rhonchi or rales  Abd: soft, nontender, no hepatomegaly  Ext: no edema Musculoskeletal:  No deformities, BUE and BLE strength normal and equal Skin: warm and dry  Neuro:  CNs 2-12 intact, no focal abnormalities noted Psych:  Normal affect   EKG:  The EKG was personally reviewed and demonstrates:  Normal sinus rhythm with subtle ST elevations of less than 1 mm in the inferior and anterolateral leads as well as widespread PR depressions suggestive of acute pericarditis. Telemetry:  Telemetry was personally reviewed and demonstrates: Sinus rhythm  Relevant CV Studies: Limited bedside echocardiogram that I personally performed demonstrates normal LVEF without wall motion abnormalities.  RV size and function appear grossly normal.  No significant pericardial effusion identified.  Laboratory Data:  High Sensitivity Troponin:  No results for input(s): "TROPONINIHS" in the last 720 hours.   Chemistry Recent Labs  Lab 04/04/24 0840  NA 142  K 3.8  CL 111  CO2 25  GLUCOSE 114*  BUN 8  CREATININE 0.52  CALCIUM  8.8*  GFRNONAA >60  ANIONGAP 6    No results for input(s): "PROT", "ALBUMIN", "AST", "ALT", "ALKPHOS", "BILITOT" in the last 168 hours. Lipids No results for input(s): "CHOL", "TRIG", "HDL", "LABVLDL", "LDLCALC", "CHOLHDL" in the last 168 hours.  Hematology Recent Labs  Lab 04/04/24 0840  WBC 13.8*  RBC 4.71  HGB 13.1  HCT 41.8   MCV 88.7  MCH 27.8  MCHC 31.3  RDW 13.3  PLT 324   Thyroid  No results for input(s): "TSH", "FREET4" in the last 168 hours.  BNPNo results for input(s): "BNP", "PROBNP" in the last 168 hours.  DDimer No results for input(s): "DDIMER" in the last 168 hours.   Radiology/Studies:  DG Chest Portable 1 View Result Date: 04/04/2024 CLINICAL DATA:  Chest pain. EXAM: PORTABLE CHEST 1 VIEW COMPARISON:  12/08/2022. FINDINGS: The heart size and mediastinal contours are within normal limits. Defibrillator pad overlies the left chest wall. Streaky right basilar opacity. No sizable pleural effusion or pneumothorax. No acute osseous abnormality. IMPRESSION: Streaky right basilar opacity could reflect atelectasis or infiltrate. Electronically Signed   By: Mannie Seek  M.D.   On: 04/04/2024 09:27     Assessment and Plan:   Chest pain and abnormal EKG: Patient presents with more than 12 hours of chest pain and EKG demonstrating subtle ST elevations less than 1 mm on most recent EKG in multiple anatomic distributions.  There is also widespread PR depression, most consistent with acute pericarditis.  ACS is also felt to be less likely as bedside echocardiogram does not show an obvious wall motion abnormality.  As the patient is chest pain-free for now with the aforementioned findings, we have agreed to defer emergent cardiac catheterization.  I would also defer adding heparin at this time pending troponin.  Continue aspirin  for now.  Will likely treat with NSAIDs and colchicine if troponin is negative, given concern for acute pericarditis.  Recommend formal echocardiogram.  For questions or updates, please contact [facility] Please consult [email or web address] for contact info under [facility].  Signed, Sammy Crisp, MD  04/04/2024 9:48 AM

## 2024-04-04 NOTE — ED Provider Notes (Signed)
 [facility] Provider Note    Event Date/Time   First MD Initiated Contact with Patient 04/04/24 [phone]     (approximate)   History   Chief Complaint Code STEMI   HPI  Alison Rodriguez is a 63 y.o. female with past medical history of hypertension, hyperlipidemia, and OSA who presents to the ED complaining of chest pain.  Patient reports that she developed severe tightness and pressure in her chest last night while at rest.  Pain improved enough for her to go to sleep, but was still present when she woke up this morning.  She went to visit her PCP, where EKG showed some ST elevation and she was given 324 mg of aspirin  as well as 1 sublingual nitro.  She states that pain has almost completely resolved since then, however prehospital EKG showed ST elevation and code STEMI was activated.  Patient denies any recent fevers, cough, or difficulty breathing, has not had any pain or swelling in her legs.  She denies any cardiac history.     Physical Exam   Triage Vital Signs: ED Triage Vitals  Encounter Vitals Group     BP      Systolic BP Percentile      Diastolic BP Percentile      Pulse      Resp      Temp      Temp src      SpO2      Weight      Height      Head Circumference      Peak Flow      Pain Score      Pain Loc      Pain Education      Exclude from Growth Chart     Most recent vital signs: Vitals:   04/04/24 0900 04/04/24 1000  BP: 112/71 115/69  Pulse: 97 85  Resp: (!) 25 16  Temp:    SpO2: 99% 99%    Constitutional: Alert and oriented. Eyes: Conjunctivae are normal. Head: Atraumatic. Nose: No congestion/rhinnorhea. Mouth/Throat: Mucous membranes are moist.  Cardiovascular: Normal rate, regular rhythm. Grossly normal heart sounds.  2+ radial pulses bilaterally. Respiratory: Normal respiratory effort.  No retractions. Lungs CTAB.  No chest wall tenderness to palpation noted. Gastrointestinal: Soft and nontender. No  distention. Musculoskeletal: No lower extremity tenderness nor edema.  Neurologic:  Normal speech and language. No gross focal neurologic deficits are appreciated.    ED Results / Procedures / Treatments   Labs (all labs ordered are listed, but only abnormal results are displayed) Labs Reviewed  CBC WITH DIFFERENTIAL/PLATELET - Abnormal; Notable for the following components:      Result Value   WBC 13.8 (*)    Neutro Abs 10.6 (*)    All other components within normal limits  BASIC METABOLIC PANEL WITH GFR - Abnormal; Notable for the following components:   Glucose, Bld 114 (*)    Calcium  8.8 (*)    All other components within normal limits  SEDIMENTATION RATE - Abnormal; Notable for the following components:   Sed Rate 32 (*)    All other components within normal limits  C-REACTIVE PROTEIN  TROPONIN I (HIGH SENSITIVITY)  TROPONIN I (HIGH SENSITIVITY)     EKG  ED ECG REPORT I, Twilla Galea, the attending physician, personally viewed and interpreted this ECG.   Date: 04/04/2024  EKG Time: 8:49  Rate: 85  Rhythm: normal sinus rhythm  Axis: Normal  Intervals:none  ST&T Change: Diffuse ST elevation consistent with pericarditis  ED ECG REPORT I, Twilla Galea, the attending physician, personally viewed and interpreted this ECG.   Date: 04/04/2024  EKG Time: 10:28  Rate: 87  Rhythm: normal sinus rhythm  Axis: Normal  Intervals:none  ST&T Change: ST elevation improved compared to previous   RADIOLOGY Chest x-ray reviewed and interpreted by me with no infiltrate, edema, or effusion.  PROCEDURES:  Critical Care performed: Yes, see critical care procedure note(s)  .Critical Care  Performed by: Twilla Galea, MD Authorized by: Twilla Galea, MD   Critical care provider statement:    Critical care time (minutes):  30   Critical care time was exclusive of:  Separately billable procedures and treating other patients and teaching time   Critical care was  necessary to treat or prevent imminent or life-threatening deterioration of the following conditions:  Cardiac failure   Critical care was time spent personally by me on the following activities:  Development of treatment plan with patient or surrogate, discussions with consultants, evaluation of patient's response to treatment, examination of patient, ordering and review of laboratory studies, ordering and review of radiographic studies, ordering and performing treatments and interventions, pulse oximetry, re-evaluation of patient's condition and review of old charts   I assumed direction of critical care for this patient from another provider in my specialty: no      MEDICATIONS ORDERED IN ED: Medications  ketorolac (TORADOL) 30 MG/ML injection 15 mg (15 mg Intravenous Given 04/04/24 1129)     IMPRESSION / MDM / ASSESSMENT AND PLAN / ED COURSE  I reviewed the triage vital signs and the nursing notes.                              63 y.o. female with past medical history of hypertension, hyperlipidemia, and OSA who presents to the ED complaining of chest pain since last night with ST elevation on prehospital EKG.  Patient's presentation is most consistent with acute presentation with potential threat to life or bodily function.  Differential diagnosis includes, but is not limited to, ACS, PE, pneumonia, pneumothorax, musculoskeletal pain, GERD, anxiety, pericarditis.  Patient nontoxic-appearing and in no acute distress, vital signs are unremarkable.  Prehospital EKG concerning for inferior ST elevation but no reciprocal changes noted.  Repeat EKG here in the ED shows ST elevation diffusely that seems most consistent with pericarditis.  Patient was evaluated by Dr. Nolan Battle of cardiology, who has canceled code STEMI, no plans for emergent cardiac catheterization at this time but will check 2 sets of troponin.  He performed a bedside echocardiogram that was reportedly unremarkable.  Patient states  that she is chest pain-free at this time.  2 sets of troponin are within normal limits and I doubt ACS.  Chest x-ray is unremarkable and additional labs with mild leukocytosis but no significant anemia, electrolyte abnormality, or AKI.  Patient appropriate for outpatient management of suspected pericarditis, will prescribe high-dose ibuprofen and colchicine.  Patient counseled to follow-up with cardiology and to return to the ED for new or worsening symptoms, patient agrees with plan.      FINAL CLINICAL IMPRESSION(S) / ED DIAGNOSES   Final diagnoses:  Chest pain, unspecified type  Acute pericarditis, unspecified type     Rx / DC Orders   ED Discharge Orders          Ordered    ibuprofen (ADVIL) 800 MG tablet  3 times daily  04/04/24 1226    colchicine 0.6 MG tablet  2 times daily        04/04/24 1226             Note:  This document was prepared using Dragon voice recognition software and may include unintentional dictation errors.   Twilla Galea, MD 04/04/24 1227

## 2024-04-04 NOTE — Assessment & Plan Note (Signed)
 CPAP.

## 2024-04-04 NOTE — Assessment & Plan Note (Signed)
 9/10 chest pain starting last night. Presented today with 8-9/10 chest pain. SOB last night. Denies sob today. Thought was due to something she ate. Took prilosec - no relief. Persistent chest pain. EKG - with ST elevation in II and minimal elevation avl. Was given 4 (81mg  aspirin ) and one SL NTG. Pain eased off. 4/10 after approximately 5 minutes. Discussed with pt regarding need for further w/up to confirm etiology. EMS called. Pt reported feeling much better after NTG and when EMS arrived.  To ER for further evaluation.  Blood pressure remained stable.  (114-128/70-90).

## 2024-04-04 NOTE — Progress Notes (Signed)
   04/04/24 0835  Spiritual Encounters  Type of Visit Initial  Care provided to: Patient  Conversation partners present during encounter Nurse (Let nurse know to call if any family came)  Referral source Code page  Reason for visit Code  OnCall Visit Yes  Spiritual Framework  Presenting Themes Meaning/purpose/sources of inspiration  Interventions  Spiritual Care Interventions Made Established relationship of care and support;Compassionate presence  Intervention Outcomes  Outcomes Connection to spiritual care;Awareness around self/spiritual resourses  Spiritual Care Plan  Spiritual Care Issues Still Outstanding No further spiritual care needs at this time (see row info)   Chaplain spoke with patient and asked if she had any family with her and she said no. Let her know that chaplain services is available if she decides she needs someone to talk to.

## 2024-04-07 ENCOUNTER — Ambulatory Visit: Payer: Self-pay | Admitting: Internal Medicine

## 2024-04-07 ENCOUNTER — Telehealth: Payer: Self-pay | Admitting: Internal Medicine

## 2024-04-07 DIAGNOSIS — I309 Acute pericarditis, unspecified: Secondary | ICD-10-CM

## 2024-04-07 NOTE — Telephone Encounter (Signed)
 Alison Rodriguez was seen Friday in clinic - had chest pain. Was transferred to ER. Cardiology evaluated. She had echo and labs. Diagnosed with pericarditis. Was placed on ibuprofen  and colchicine . Please call and confirm she is doing ok.  Also, ER had recommended cardiology f/u. Please confirm if has f/u. If not, see if agreeable for referral.

## 2024-04-07 NOTE — Addendum Note (Signed)
 Addended by: Victorino Grates D on: 04/07/2024 01:03 PM   Modules accepted: Orders

## 2024-04-07 NOTE — Telephone Encounter (Signed)
 FYI   Called patient to follow up. She is feeling ok. Patient asked to have new cardiology referral placed to Dr Beau Bound at [facility]. Referral has been placed

## 2024-04-07 NOTE — Telephone Encounter (Signed)
 Reviewed

## 2024-04-08 NOTE — Telephone Encounter (Signed)
 Inform her to let us  know when appt is made. Also, needs to let us  know if any change in symptoms or if needs something prior to appt.

## 2024-04-16 NOTE — Telephone Encounter (Signed)
 Appt scheduled with Dr Beau Bound for 6/4

## 2024-04-19 ENCOUNTER — Other Ambulatory Visit: Payer: Self-pay | Admitting: Internal Medicine

## 2024-05-28 ENCOUNTER — Other Ambulatory Visit: Payer: Self-pay

## 2024-05-28 MED ORDER — TAMOXIFEN CITRATE 10 MG PO TABS: 10.0000 mg | ORAL_TABLET | Freq: Every day | ORAL | 3 refills | Status: AC

## 2024-07-25 ENCOUNTER — Other Ambulatory Visit: Payer: Self-pay | Admitting: Internal Medicine

## 2024-07-25 DIAGNOSIS — Z1231 Encounter for screening mammogram for malignant neoplasm of breast: Secondary | ICD-10-CM

## 2024-09-15 ENCOUNTER — Ambulatory Visit
Admission: RE | Admit: 2024-09-15 | Discharge: 2024-09-15 | Disposition: A | Source: Ambulatory Visit | Attending: Internal Medicine | Admitting: Internal Medicine

## 2024-09-15 DIAGNOSIS — Z1231 Encounter for screening mammogram for malignant neoplasm of breast: Secondary | ICD-10-CM

## 2024-11-03 ENCOUNTER — Telehealth: Payer: Self-pay | Admitting: Hematology and Oncology

## 2024-11-03 NOTE — Telephone Encounter (Signed)
 Spoke with patient regarding 12/08/24 appt, changed to telephone visit 12/19/24. Patient is aware of new appt date and time

## 2024-11-27 ENCOUNTER — Telehealth: Payer: Self-pay | Admitting: Hematology and Oncology

## 2024-11-27 NOTE — Telephone Encounter (Signed)
 I left voicemail for patient regarding rescheduled telephone visit from 12/19/2024 to 12/19/2023

## 2024-12-08 ENCOUNTER — Ambulatory Visit: Payer: 59 | Admitting: Hematology and Oncology

## 2024-12-16 ENCOUNTER — Telehealth: Payer: Self-pay | Admitting: Hematology and Oncology

## 2024-12-16 NOTE — Telephone Encounter (Signed)
 I spoke to patient and she is aware of telephone visit on 12/26/2024 rescheduled from 12/18/2024.

## 2024-12-18 ENCOUNTER — Inpatient Hospital Stay: Payer: Self-pay | Admitting: Hematology and Oncology

## 2024-12-19 ENCOUNTER — Inpatient Hospital Stay: Admitting: Hematology and Oncology

## 2024-12-26 ENCOUNTER — Inpatient Hospital Stay: Payer: Self-pay | Admitting: Hematology and Oncology

## 2024-12-26 ENCOUNTER — Telehealth: Payer: Self-pay | Admitting: Hematology and Oncology

## 2024-12-26 NOTE — Progress Notes (Unsigned)
 [name] CONSULT NOTE  Patient Care Team: Glendia Shad, MD as PCP - General (Internal Medicine)  CHIEF COMPLAINTS/PURPOSE OF CONSULTATION:  ADH  ASSESSMENT & PLAN:   This is a very pleasant 64 year old postmenopausal female patient with newly diagnosed atypical ductal hyperplasia on tamoxifen  here for follow up.  Breast Cancer On Tamoxifen  5mg  daily. No joint pain reported. Mammogram from October was normal. -Continue Tamoxifen  5mg  daily. -Next mammogram due in October 2025.  Hyperlipidemia On Crestor . Reports itching but unsure if related to medication. -Consider holding Crestor  to see if itching improves.  Weight Management Patient reports desire to lose weight and has started exercising. -Encourage continued exercise and healthy diet.  General Health Maintenance -Continue current medications. -Annual follow-up or sooner if any issues arise.  HISTORY OF PRESENTING ILLNESS:  Alison Rodriguez 64 y.o. female is here because of ADH Discussed the use of AI scribe software for clinical note transcription with the patient, who gave verbal consent to proceed.  History of Present Illness     The patient, with a history of ADH on tamoxifen  and hyperlipidemia on Crestor , presents with generalized itching. She denies any new medications and has been on both tamoxifen  and Crestor  for some time. She has consulted with her primary care physician regarding this issue.  In addition, the patient reports a significant improvement in joint pain since reducing the dose of tamoxifen  to 5mg . She also reports a recent diagnosis of fluid in the ear, for which she has been prescribed a two-week course of prednisone .  The patient has also started taking an herbal supplement, which she reports has helped to reduce her appetite and improve her sleep. She denies any new symptoms since starting the supplement.  Finally, the patient expresses a desire to lose weight and increase her  level of physical activity. She acknowledges the challenge of doing so, particularly while on prednisone .  MEDICAL HISTORY:  Past Medical History:  Diagnosis Date   Blood in stool    H/O   Hyperlipidemia    Hypertension    Sleep apnea    Thyroid  disease     SURGICAL HISTORY: Past Surgical History:  Procedure Laterality Date   BREAST BIOPSY Left 10/06/2022   MM LT BREAST BX W LOC DEV 1ST LESION IMAGE BX SPEC STEREO GUIDE 10/06/2022 [facility]   BREAST BIOPSY  12/13/2022   MM LT RADIOACTIVE SEED LOC MAMMO GUIDE 12/13/2022 [facility]   BREAST LUMPECTOMY WITH RADIOACTIVE SEED LOCALIZATION Left 12/14/2022   Procedure: LEFT BREAST LUMPECTOMY WITH RADIOACTIVE SEED LOCALIZATION;  Surgeon: Curvin Deward MOULD, MD;  Location: [facility];  Service: General;  Laterality: Left;   COLONOSCOPY WITH PROPOFOL  N/A 02/04/2021   Procedure: COLONOSCOPY WITH PROPOFOL ;  Surgeon: Jinny Carmine, MD;  Location: [facility];  Service: Endoscopy;  Laterality: N/A;  priority 4   POLYPECTOMY  02/04/2021   Procedure: POLYPECTOMY;  Surgeon: Jinny Carmine, MD;  Location: [facility];  Service: Endoscopy;;   UTERINE FIBROID EMBOLIZATION      SOCIAL HISTORY: Social History   Socioeconomic History   Marital status: Married    Spouse name: Not on file   Number of children: Not on file   Years of education: Not on file   Highest education level: Bachelor's degree (e.g., BA, AB, BS)  Occupational History   Not on file  Tobacco Use   Smoking status: Never    Passive exposure: Yes   Smokeless tobacco: Never  Substance and Sexual Activity  Alcohol use: No    Alcohol/week: 0.0 standard drinks of alcohol   Drug use: No   Sexual activity: Not on file  Other Topics Concern   Not on file  Social History Narrative   Not on file   Social Drivers of Health   Tobacco Use: Low Risk  (07/28/2024)   Received from [facility]   Patient  History    Passive Exposure: Not on file    Smokeless Tobacco Use: Never    Smoking Tobacco Use: Never  Financial Resource Strain: Low Risk (11/12/2023)   Overall Financial Resource Strain (CARDIA)    Difficulty of Paying Living Expenses: Not hard at all  Food Insecurity: No Food Insecurity (11/12/2023)   Hunger Vital Sign    Worried About Running Out of Food in the Last Year: Never true    Ran Out of Food in the Last Year: Never true  Transportation Needs: No Transportation Needs (11/12/2023)   PRAPARE - Administrator, Civil Service (Medical): No    Lack of Transportation (Non-Medical): No  Physical Activity: Unknown (11/12/2023)   Exercise Vital Sign    Days of Exercise per Week: 0 days    Minutes of Exercise per Session: Not on file  Recent Concern: Physical Activity - Inactive (11/12/2023)   Exercise Vital Sign    Days of Exercise per Week: 0 days    Minutes of Exercise per Session: 30 min  Stress: No Stress Concern Present (11/12/2023)   [employer] of Occupational Health - Occupational Stress Questionnaire    Feeling of Stress : Not at all  Social Connections: Socially Integrated (11/12/2023)   Social Connection and Isolation Panel    Frequency of Communication with Friends and Family: Twice a week    Frequency of Social Gatherings with Friends and Family: Once a week    Attends Religious Services: More than 4 times per year    Active Member of [employer] or Organizations: Yes    Attends Banker Meetings: More than 4 times per year    Marital Status: Married  Catering Manager Violence: Not on file  Depression (PHQ2-9): Low Risk (11/12/2023)   Depression (PHQ2-9)    PHQ-2 Score: 0  Alcohol Screen: Low Risk (11/12/2023)   Alcohol Screen    Last Alcohol Screening Score (AUDIT): 1  Housing: Unknown (04/23/2024)   Received from [facility]   Epic    At any time in the past 12 months, were you homeless or  living in a shelter (including now)?: No    Number of Times Moved in the Last Year: Not on file    Unable to Pay for Housing in the Last Year: Not on file  Utilities: Not on file  Health Literacy: Not on file    FAMILY HISTORY: Family History  Problem Relation Age of Onset   Diabetes Mother    Diabetes Maternal Grandmother    Arthritis Maternal Grandfather    Diabetes Maternal Grandfather    Breast cancer Other        second cousins/aunt    ALLERGIES:  is allergic to peanut-containing drug products.  MEDICATIONS:  Current Outpatient Medications  Medication Sig Dispense Refill   colchicine  0.6 MG tablet Take 1 tablet (0.6 mg total) by mouth 2 (two) times daily. 60 tablet 2   fluticasone  (FLONASE ) 50 MCG/ACT nasal spray Place 2 sprays into both nostrils daily. 16 g 6   rosuvastatin  (CRESTOR ) 10 MG tablet TAKE 1 TABLET  BY MOUTH EVERY DAY 30 tablet 11   saccharomyces boulardii (FLORASTOR) 250 MG capsule Take 1 capsule (250 mg total) by mouth daily. 90 capsule 0   tamoxifen  (NOLVADEX ) 10 MG tablet Take 1 tablet (10 mg total) by mouth daily. 90 tablet 3   Current Facility-Administered Medications  Medication Dose Route Frequency Provider Last Rate Last Admin   aspirin  chewable tablet 324 mg  324 mg Oral Once        nitroGLYCERIN  (NITROSTAT ) SL tablet 0.4 mg  0.4 mg Sublingual Q5 min PRN          PHYSICAL EXAMINATION: ECOG PERFORMANCE STATUS: 0 - Asymptomatic  There were no vitals filed for this visit.   There were no vitals filed for this visit.  Physical Exam Constitutional:      Appearance: Normal appearance.  Chest:     Comments: Bilateral breasts inspected. NO palpable masses or regional adenopathy. Musculoskeletal:     Cervical back: Normal range of motion. No rigidity.  Lymphadenopathy:     Cervical: No cervical adenopathy.  Neurological:     Mental Status: She is alert.      LABORATORY DATA:  I have reviewed the data as listed Lab Results   Component Value Date   WBC 13.8 (H) 04/04/2024   HGB 13.1 04/04/2024   HCT 41.8 04/04/2024   MCV 88.7 04/04/2024   PLT 324 04/04/2024     Chemistry      Component Value Date/Time   NA 142 04/04/2024 0840   NA 139 12/08/2014 1713   K 3.8 04/04/2024 0840   K 4.1 12/08/2014 1713   CL 111 04/04/2024 0840   CL 104 12/08/2014 1713   CO2 25 04/04/2024 0840   CO2 28 12/08/2014 1713   BUN 8 04/04/2024 0840   BUN 11 12/08/2014 1713   CREATININE 0.52 04/04/2024 0840   CREATININE 0.63 12/08/2014 1713      Component Value Date/Time   CALCIUM  8.8 (L) 04/04/2024 0840   CALCIUM  8.8 12/08/2014 1713   ALKPHOS 50 12/07/2023 0813   ALKPHOS 68 12/08/2014 1713   AST 17 12/07/2023 0813   AST 25 12/08/2014 1713   ALT 16 12/07/2023 0813   ALT 28 12/08/2014 1713   BILITOT 0.3 12/07/2023 0813   BILITOT 0.3 12/08/2014 1713       RADIOGRAPHIC STUDIES: I have personally reviewed the radiological images as listed and agreed with the findings in the report. No results found.  All questions were answered. The patient knows to call the clinic with any problems, questions or concerns. I spent 20 minutes in the care of this patient including H and P, review of records, counseling and coordination of care.     Amber Stalls, MD 12/26/2024 1:04 PM

## 2024-12-26 NOTE — Telephone Encounter (Signed)
 I spoke with patient and she is scheduled for MD annual f/u on 02/06/2025.

## 2025-01-12 ENCOUNTER — Encounter: Admitting: Internal Medicine

## 2025-02-06 ENCOUNTER — Inpatient Hospital Stay: Payer: Self-pay | Admitting: Hematology and Oncology
